# Patient Record
Sex: Female | Born: 1945
Health system: Southern US, Community
[De-identification: ages and names within clinical notes are randomized; demographics above are authoritative.]

## PROBLEM LIST (undated history)

## (undated) DIAGNOSIS — I471 Supraventricular tachycardia, unspecified: Secondary | ICD-10-CM

## (undated) DIAGNOSIS — R002 Palpitations: Secondary | ICD-10-CM

## (undated) DIAGNOSIS — M5416 Radiculopathy, lumbar region: Secondary | ICD-10-CM

## (undated) DIAGNOSIS — R7303 Prediabetes: Secondary | ICD-10-CM

## (undated) DIAGNOSIS — H209 Unspecified iridocyclitis: Secondary | ICD-10-CM

## (undated) DIAGNOSIS — M1990 Unspecified osteoarthritis, unspecified site: Secondary | ICD-10-CM

## (undated) DIAGNOSIS — M810 Age-related osteoporosis without current pathological fracture: Secondary | ICD-10-CM

## (undated) DIAGNOSIS — N952 Postmenopausal atrophic vaginitis: Secondary | ICD-10-CM

## (undated) DIAGNOSIS — N816 Rectocele: Secondary | ICD-10-CM

## (undated) DIAGNOSIS — E785 Hyperlipidemia, unspecified: Secondary | ICD-10-CM

## (undated) DIAGNOSIS — R0789 Other chest pain: Secondary | ICD-10-CM

## (undated) DIAGNOSIS — Z9841 Cataract extraction status, right eye: Secondary | ICD-10-CM

## (undated) DIAGNOSIS — R011 Cardiac murmur, unspecified: Secondary | ICD-10-CM

## (undated) DIAGNOSIS — R030 Elevated blood-pressure reading, without diagnosis of hypertension: Secondary | ICD-10-CM

## (undated) DIAGNOSIS — IMO0002 Reserved for concepts with insufficient information to code with codable children: Secondary | ICD-10-CM

## (undated) DIAGNOSIS — H269 Unspecified cataract: Secondary | ICD-10-CM

## (undated) DIAGNOSIS — Z9842 Cataract extraction status, left eye: Secondary | ICD-10-CM

## (undated) DIAGNOSIS — K219 Gastro-esophageal reflux disease without esophagitis: Secondary | ICD-10-CM

## (undated) DIAGNOSIS — H409 Unspecified glaucoma: Secondary | ICD-10-CM

## (undated) DIAGNOSIS — I341 Nonrheumatic mitral (valve) prolapse: Secondary | ICD-10-CM

## (undated) DIAGNOSIS — M7138 Other bursal cyst, other site: Secondary | ICD-10-CM

## (undated) HISTORY — DX: Other chest pain: R07.89

## (undated) HISTORY — DX: Nonrheumatic mitral (valve) prolapse: I34.1

## (undated) HISTORY — PX: ABDOMINAL HYSTERECTOMY: SHX81

## (undated) HISTORY — DX: Age-related osteoporosis without current pathological fracture: M81.0

## (undated) HISTORY — DX: Elevated blood-pressure reading, without diagnosis of hypertension: R03.0

## (undated) HISTORY — DX: Hyperlipidemia, unspecified: E78.5

## (undated) HISTORY — DX: Rectocele: N81.6

## (undated) HISTORY — DX: Postmenopausal atrophic vaginitis: N95.2

## (undated) HISTORY — DX: Supraventricular tachycardia, unspecified: I47.10

## (undated) HISTORY — PX: CATARACT EXTRACTION, BILATERAL: SHX1313

## (undated) HISTORY — DX: Unspecified cataract: H26.9

## (undated) HISTORY — PX: EYE SURGERY: SHX253

## (undated) HISTORY — DX: Gastro-esophageal reflux disease without esophagitis: K21.9

## (undated) HISTORY — DX: Unspecified glaucoma: H40.9

## (undated) HISTORY — PX: TUBAL LIGATION: SHX77

## (undated) HISTORY — DX: Unspecified osteoarthritis, unspecified site: M19.90

## (undated) HISTORY — DX: Reserved for concepts with insufficient information to code with codable children: IMO0002

## (undated) HISTORY — DX: Unspecified iridocyclitis: H20.9

---

## 1979-01-26 DIAGNOSIS — L821 Other seborrheic keratosis: Secondary | ICD-10-CM | POA: Insufficient documentation

## 2010-01-05 LAB — HM PAP SMEAR: HM Pap smear: NORMAL

## 2011-12-20 DIAGNOSIS — H4050X0 Glaucoma secondary to other eye disorders, unspecified eye, stage unspecified: Secondary | ICD-10-CM | POA: Diagnosis not present

## 2011-12-20 DIAGNOSIS — H209 Unspecified iridocyclitis: Secondary | ICD-10-CM | POA: Diagnosis not present

## 2011-12-20 DIAGNOSIS — H409 Unspecified glaucoma: Secondary | ICD-10-CM | POA: Diagnosis not present

## 2012-02-01 DIAGNOSIS — Z1231 Encounter for screening mammogram for malignant neoplasm of breast: Secondary | ICD-10-CM | POA: Diagnosis not present

## 2012-03-08 DIAGNOSIS — M810 Age-related osteoporosis without current pathological fracture: Secondary | ICD-10-CM | POA: Diagnosis not present

## 2012-03-08 DIAGNOSIS — Z79899 Other long term (current) drug therapy: Secondary | ICD-10-CM | POA: Diagnosis not present

## 2012-03-08 DIAGNOSIS — Z0000 Encounter for general adult medical examination without abnormal findings: Secondary | ICD-10-CM | POA: Diagnosis not present

## 2012-03-15 DIAGNOSIS — Z0000 Encounter for general adult medical examination without abnormal findings: Secondary | ICD-10-CM | POA: Diagnosis not present

## 2012-03-15 DIAGNOSIS — M899 Disorder of bone, unspecified: Secondary | ICD-10-CM | POA: Diagnosis not present

## 2012-03-15 DIAGNOSIS — M949 Disorder of cartilage, unspecified: Secondary | ICD-10-CM | POA: Diagnosis not present

## 2012-03-15 DIAGNOSIS — M810 Age-related osteoporosis without current pathological fracture: Secondary | ICD-10-CM | POA: Diagnosis not present

## 2012-03-15 DIAGNOSIS — Z01419 Encounter for gynecological examination (general) (routine) without abnormal findings: Secondary | ICD-10-CM | POA: Diagnosis not present

## 2012-03-15 DIAGNOSIS — R634 Abnormal weight loss: Secondary | ICD-10-CM | POA: Diagnosis not present

## 2012-05-11 DIAGNOSIS — R05 Cough: Secondary | ICD-10-CM | POA: Diagnosis not present

## 2012-05-28 DIAGNOSIS — Z23 Encounter for immunization: Secondary | ICD-10-CM | POA: Diagnosis not present

## 2012-08-10 DIAGNOSIS — R05 Cough: Secondary | ICD-10-CM | POA: Diagnosis not present

## 2012-12-25 DIAGNOSIS — H409 Unspecified glaucoma: Secondary | ICD-10-CM | POA: Diagnosis not present

## 2013-02-27 DIAGNOSIS — Z1231 Encounter for screening mammogram for malignant neoplasm of breast: Secondary | ICD-10-CM | POA: Diagnosis not present

## 2013-02-27 LAB — HM MAMMOGRAPHY: HM Mammogram: NORMAL

## 2013-05-06 ENCOUNTER — Ambulatory Visit (INDEPENDENT_AMBULATORY_CARE_PROVIDER_SITE_OTHER): Payer: Medicare Other | Admitting: Family Medicine

## 2013-05-06 ENCOUNTER — Encounter: Payer: Self-pay | Admitting: Family Medicine

## 2013-05-06 VITALS — BP 110/80 | HR 70 | Temp 97.9°F | Ht 65.0 in | Wt 116.0 lb

## 2013-05-06 DIAGNOSIS — Z0000 Encounter for general adult medical examination without abnormal findings: Secondary | ICD-10-CM | POA: Insufficient documentation

## 2013-05-06 DIAGNOSIS — Z23 Encounter for immunization: Secondary | ICD-10-CM | POA: Diagnosis not present

## 2013-05-06 DIAGNOSIS — M899 Disorder of bone, unspecified: Secondary | ICD-10-CM | POA: Diagnosis not present

## 2013-05-06 DIAGNOSIS — M8580 Other specified disorders of bone density and structure, unspecified site: Secondary | ICD-10-CM

## 2013-05-06 DIAGNOSIS — Z780 Asymptomatic menopausal state: Secondary | ICD-10-CM | POA: Diagnosis not present

## 2013-05-06 DIAGNOSIS — H2011 Chronic iridocyclitis, right eye: Secondary | ICD-10-CM

## 2013-05-06 DIAGNOSIS — M1990 Unspecified osteoarthritis, unspecified site: Secondary | ICD-10-CM | POA: Insufficient documentation

## 2013-05-06 LAB — CBC WITH DIFFERENTIAL/PLATELET
Basophils Relative: 0.7 % (ref 0.0–3.0)
Eosinophils Absolute: 0.2 10*3/uL (ref 0.0–0.7)
Lymphocytes Relative: 30.1 % (ref 12.0–46.0)
MCHC: 34.1 g/dL (ref 30.0–36.0)
MCV: 94.5 fl (ref 78.0–100.0)
Monocytes Absolute: 0.3 10*3/uL (ref 0.1–1.0)
Neutrophils Relative %: 60.6 % (ref 43.0–77.0)
Platelets: 197 10*3/uL (ref 150.0–400.0)
RBC: 4.54 Mil/uL (ref 3.87–5.11)
WBC: 6.1 10*3/uL (ref 4.5–10.5)

## 2013-05-06 LAB — COMPREHENSIVE METABOLIC PANEL
ALT: 25 U/L (ref 0–35)
AST: 26 U/L (ref 0–37)
Alkaline Phosphatase: 40 U/L (ref 39–117)
CO2: 29 mEq/L (ref 19–32)
Creatinine, Ser: 0.6 mg/dL (ref 0.4–1.2)
Sodium: 137 mEq/L (ref 135–145)
Total Bilirubin: 0.7 mg/dL (ref 0.3–1.2)
Total Protein: 6.8 g/dL (ref 6.0–8.3)

## 2013-05-06 LAB — TSH: TSH: 0.99 u[IU]/mL (ref 0.35–5.50)

## 2013-05-06 MED ORDER — RANITIDINE HCL 300 MG PO TABS: 300.0000 mg | ORAL_TABLET | Freq: Every day | ORAL | Status: DC

## 2013-05-06 NOTE — Progress Notes (Signed)
Subjective:    Patient ID: Christy [name], female    DOB: Jul 30, 1946, 67 y.o.   MRN: [id number]  HPI  Very pleasant 67 yo healthy female here to establish care and for medicare wellness visit.  I have personally reviewed the Medicare Annual Wellness questionnaire and have noted 1. The patient's medical and social history 2. Their use of alcohol, tobacco or illicit drugs 3. Their current medications and supplements 4. The patient's functional ability including ADL's, fall risks, home safety risks and hearing or visual             impairment. 5. Diet and physical activities 6. Evidence for depression or mood disorders  End of life wishes discussed and updated in Social History.  Recently moved to [facility] from [place].  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 Cares for her husband who has has dementia.  She feels she is coping very well with this- walks with him every day.  He goes to adult daycare 3 days per week for 6 hours.  She also is a member of a caregiver's support group.   Brings in old records- reviewed. UTD on most prevention.  Due for flu shot which she agrees to have today. Also due for bone density.  GERD- symptoms have been well controlled on current dose of Zantac.   Has appt scheduled with local opthalmologist for her chornic uveitis and glaucoma. Patient Active Problem List   Diagnosis Date Noted  . Encounter for Medicare annual wellness exam 05/06/2013  . Chronic uveitis of right eye 05/06/2013  . Arthritis    Past Medical History  Diagnosis Date  . GERD (gastroesophageal reflux disease)   . Arthritis   . Glaucoma    Past Surgical History  Procedure Laterality Date  . Cataract extraction, bilateral     History  Substance Use Topics  . Smoking status: Never Smoker   . Smokeless tobacco: Not on file  . Alcohol Use: Not on file   Family History  Problem Relation Age of Onset  . Aortic stenosis Mother   . Heart disease Mother   . Osteoporosis Mother   .  Cancer Father     prostate  . Heart disease Father   . Multiple sclerosis Daughter    Allergies  Allergen Reactions  . Xalatan [Latanoprost]     Caused eye to be red, swollen,itchy   No current outpatient prescriptions on file prior to visit.   No current facility-administered medications on file prior to visit.   The PMH, PSH, Social History, Family History, Medications, and allergies have been reviewed in [facility], and have been updated if relevant.   Review of Systems    See HPI Patient reports no  vision/ hearing changes,anorexia, weight change, fever ,adenopathy, persistant / recurrent hoarseness, swallowing issues, chest pain, edema,persistant / recurrent cough, hemoptysis, dyspnea(rest, exertional, paroxysmal nocturnal), gastrointestinal  bleeding (melena, rectal bleeding), abdominal pain, excessive heart burn, GU symptoms(dysuria, hematuria, pyuria, voiding/incontinence  Issues) syncope, focal weakness, severe memory loss, concerning skin lesions, depression, anxiety, abnormal bruising/bleeding, major joint swelling, breast masses or abnormal vaginal bleeding.    Objective:   Physical Exam BP 110/80  Pulse 70  Temp(Src) 97.9 F (36.6 C)  Ht 5\' 5"  (1.651 m)  Wt 116 lb (52.617 kg)  BMI 19.3 kg/m2  General:  Well-developed,well-nourished,in no acute distress; alert,appropriate and cooperative throughout examination Head:  normocephalic and atraumatic.   Eyes:  vision grossly intact, pupils equal, pupils round, and pupils reactive to light.   Ears:  R ear normal and L ear normal.   Nose:  no external deformity.   Mouth:  good dentition.   Neck:  No deformities, masses, or tenderness noted. Lungs:  Normal respiratory effort, chest expands symmetrically. Lungs are clear to auscultation, no crackles or wheezes. Heart:  Normal rate and regular rhythm. S1 and S2 normal without gallop, murmur, click, rub or other extra sounds. Abdomen:  Bowel sounds positive,abdomen soft and  non-tender without masses, organomegaly or hernias noted. Msk:  No deformity or scoliosis noted of thoracic or lumbar spine.   Extremities:  No clubbing, cyanosis, edema, or deformity noted with normal full range of motion of all joints.   Neurologic:  alert & oriented X3 and gait normal.   Skin:  Intact without suspicious lesions or rashes Cervical Nodes:  No lymphadenopathy noted Axillary Nodes:  No palpable lymphadenopathy Psych:  Cognition and judgment appear intact. Alert and cooperative with normal attention span and concentration. No apparent delusions, illusions, hallucinations        Assessment & Plan:  1. Post-menopausal  - DG Bone Density; Future  2. Routine general medical examination at a health care facility The patients weight, height, BMI and visual acuity have been recorded in the chart I have made referrals, counseling and provided education to the patient based review of the above and I have provided the pt with a written personalized care plan for preventive services.  - CBC with Differential - TSH - Comprehensive metabolic panel  3. Osteopenia Continue Caltrate, weight bearing exercise and adequate dietary calcium and vit d intake. - Vitamin D, 25-hydroxy  4. Chronic uveitis of right eye Followed by optho.  6. GERD Rx for Xantac refilled.

## 2013-05-06 NOTE — Addendum Note (Signed)
Addended by: Eliezer Bottom on: 05/06/2013 12:01 PM   Modules accepted: Orders

## 2013-05-06 NOTE — Patient Instructions (Addendum)
Good to see you.  Calcium supplements have received some bad press lately, with questions that they may increase risk of heart attack or blood clots.  The risk is very low, however none of these risks occur with calcium in FOOD. Try to get most or all of your calcium from your food--1200 mg/day for women over 50 and men over 70.  To figure out dietary calcium: 300 mg/day from all non dairy foods plus 300 mg per cup of milk, other dairy, or fortified juice.  Non dairy foods that contain calcium:  Kale, oranges, sardines, oatmeal, soy milk/soybeans, salmon, white beans, dried figs, turnip greens, almonds, broccoli, tofu.  We will call you with your lab results and you can view them online.

## 2013-05-07 ENCOUNTER — Encounter: Payer: Self-pay | Admitting: *Deleted

## 2013-05-07 LAB — VITAMIN D 25 HYDROXY (VIT D DEFICIENCY, FRACTURES): Vit D, 25-Hydroxy: 46 ng/mL (ref 30–89)

## 2013-06-04 ENCOUNTER — Encounter: Payer: Self-pay | Admitting: Family Medicine

## 2013-06-04 ENCOUNTER — Ambulatory Visit: Payer: Self-pay | Admitting: Family Medicine

## 2013-06-04 DIAGNOSIS — Z780 Asymptomatic menopausal state: Secondary | ICD-10-CM | POA: Diagnosis not present

## 2013-06-04 DIAGNOSIS — M810 Age-related osteoporosis without current pathological fracture: Secondary | ICD-10-CM | POA: Diagnosis not present

## 2013-06-24 DIAGNOSIS — H4010X0 Unspecified open-angle glaucoma, stage unspecified: Secondary | ICD-10-CM | POA: Diagnosis not present

## 2013-09-02 ENCOUNTER — Encounter: Payer: Self-pay | Admitting: Family Medicine

## 2013-09-02 ENCOUNTER — Ambulatory Visit (INDEPENDENT_AMBULATORY_CARE_PROVIDER_SITE_OTHER): Payer: Medicare Other | Admitting: Family Medicine

## 2013-09-02 VITALS — BP 112/70 | HR 72 | Temp 97.3°F | Ht 64.5 in | Wt 119.8 lb

## 2013-09-02 DIAGNOSIS — M810 Age-related osteoporosis without current pathological fracture: Secondary | ICD-10-CM

## 2013-09-02 MED ORDER — ALENDRONATE SODIUM 70 MG PO TABS: 70.0000 mg | ORAL_TABLET | ORAL | Status: DC

## 2013-09-02 NOTE — Progress Notes (Signed)
Pre-visit discussion using our clinic review tool. No additional management support is needed unless otherwise documented below in the visit note.  

## 2013-09-02 NOTE — Progress Notes (Signed)
  Subjective:    Patient ID: Christy [name], female    DOB: August 09, 1946, 68 y.o.   MRN: [id number]  HPI  Very pleasant 9 yofemale here to discuss osteoporosis.  Bone density from 05/2013, showed radius T score of -3.1.  She does take Calcium/Vit D.  Has never taken a bisphosphonate. Does have significant GERD/Reflux.   Patient Active Problem List   Diagnosis Date Noted  . Encounter for Medicare annual wellness exam 05/06/2013  . Chronic uveitis of right eye 05/06/2013  . Arthritis    Past Medical History  Diagnosis Date  . GERD (gastroesophageal reflux disease)   . Arthritis   . Glaucoma    Past Surgical History  Procedure Laterality Date  . Cataract extraction, bilateral     History  Substance Use Topics  . Smoking status: Never Smoker   . Smokeless tobacco: Not on file  . Alcohol Use: Not on file   Family History  Problem Relation Age of Onset  . Aortic stenosis Mother   . Heart disease Mother   . Osteoporosis Mother   . Cancer Father     prostate  . Heart disease Father   . Multiple sclerosis Daughter    Allergies  Allergen Reactions  . Xalatan [Latanoprost]     Caused eye to be red, swollen,itchy   Current Outpatient Prescriptions on File Prior to Visit  Medication Sig Dispense Refill  . aspirin 81 MG tablet Take 81 mg by mouth daily.      . brimonidine (ALPHAGAN) 0.2 % ophthalmic solution Place 1 drop into the left eye 2 (two) times daily.       . cholecalciferol (VITAMIN D) 1000 UNITS tablet Take 1,000 Units by mouth daily.      . dorzolamide-timolol (COSOPT) 22.3-6.8 MG/ML ophthalmic solution Place 1 drop into the left eye every 12 (twelve) hours.      Marland Kitchen GLUCOSAMINE CHONDROITIN COMPLX PO Take by mouth.      . Multiple Vitamin (MULTIVITAMIN) tablet Take 1 tablet by mouth daily.      . Omega-3 Fatty Acids (FISH OIL) 1200 MG CAPS Take 2 by mouth daily      . ranitidine (ZANTAC) 300 MG tablet Take 1 tablet (300 mg total) by mouth at bedtime.  90 tablet  3    No current facility-administered medications on file prior to visit.   The PMH, PSH, Social History, Family History, Medications, and allergies have been reviewed in [facility], and have been updated if relevant.   Review of Systems    See HPI   Objective:   Physical Exam BP 112/70  Pulse 72  Temp(Src) 97.3 F (36.3 C) (Oral)  Ht 5' 4.5" (1.638 m)  Wt 119 lb 12 oz (54.318 kg)  BMI 20.24 kg/m2  SpO2 97%  General:  Well-developed,well-nourished,in no acute distress; alert,appropriate and cooperative throughout examination Head:  normocephalic and atraumatic.   Psych:  Cognition and judgment appear intact. Alert and cooperative with normal attention span and concentration. No apparent delusions, illusions, hallucinations     Assessment & Plan:

## 2013-09-02 NOTE — Patient Instructions (Signed)
Please ask your insurance company about Prolia coverage.  Start Fosamax as prescribed.  Call me as soon as you have heard from your insurance company.   To figure out dietary calcium: 300 mg/day from all non dairy foods plus 300 mg per cup of milk, other dairy, or fortified juice.  Non dairy foods that contain calcium:  Kale, oranges, sardines, oatmeal, soy milk/soybeans, salmon, white beans, dried figs, turnip greens, almonds, broccoli, tofu.

## 2013-09-02 NOTE — Assessment & Plan Note (Signed)
New.  Discussed tx options. Start fosamax but may need to switch to prolia given h/o GERD. Discussed sources of dietary calcium as well. Per pt, insurance plan will pay for IV reclast.

## 2013-09-23 ENCOUNTER — Telehealth: Payer: Self-pay | Admitting: Family Medicine

## 2013-09-23 NOTE — Telephone Encounter (Signed)
Faxed insurance verification form to [employer]

## 2013-09-23 NOTE — Telephone Encounter (Signed)
Pt states she needs Prolia prior auth.  I have completed the form to be faxed, but it needs Dr. Hulen Shouts signature.  I have placed this form in an orange folder and put it on your desk to pass on to Dr. Deborra Medina. Once it's complete I will fax to Prolia. Thank you.

## 2013-09-25 NOTE — Telephone Encounter (Signed)
Attempted to contact pt, unable to leave message as no vm is set up.

## 2013-09-25 NOTE — Telephone Encounter (Signed)
Prolia responded w/approval. Earl Lagos is going to contact patient to schedule appointment.

## 2013-09-26 NOTE — Telephone Encounter (Signed)
Spoke to pt and informed her of prior authorization approval in conjunction with $147 deductible. Pt states that she is going to contact her insurance company to verify and ensure that the amount paid will cover the entire year, and both injections

## 2013-09-26 NOTE — Telephone Encounter (Signed)
Spoke to pt who confirms that she has spoken with her insurance company and they will cover the expense for pt receiving Prolia; appt scheduled for injection.

## 2013-09-26 NOTE — Telephone Encounter (Signed)
Pt states that she will contact the office back for scheduling after she has spoken with her insurance company

## 2013-10-16 ENCOUNTER — Ambulatory Visit: Payer: Medicare Other

## 2013-11-12 ENCOUNTER — Ambulatory Visit (INDEPENDENT_AMBULATORY_CARE_PROVIDER_SITE_OTHER): Payer: Medicare Other | Admitting: Family Medicine

## 2013-11-12 DIAGNOSIS — M810 Age-related osteoporosis without current pathological fracture: Secondary | ICD-10-CM

## 2013-11-12 MED ORDER — DENOSUMAB 60 MG/ML ~~LOC~~ SOLN
60.0000 mg | Freq: Once | SUBCUTANEOUS | Status: AC
  Administered 2013-11-12: 60 mg via SUBCUTANEOUS

## 2013-12-27 DIAGNOSIS — H4010X0 Unspecified open-angle glaucoma, stage unspecified: Secondary | ICD-10-CM | POA: Diagnosis not present

## 2014-03-05 ENCOUNTER — Other Ambulatory Visit: Payer: Self-pay | Admitting: Family Medicine

## 2014-03-05 DIAGNOSIS — Z1231 Encounter for screening mammogram for malignant neoplasm of breast: Secondary | ICD-10-CM

## 2014-04-01 ENCOUNTER — Telehealth: Payer: Self-pay | Admitting: Family Medicine

## 2014-04-01 NOTE — Telephone Encounter (Signed)
She wanted to schedule for sometime in sept

## 2014-04-01 NOTE — Telephone Encounter (Signed)
Pt received last prolia 10/2013. If she is wanting to have another injection, we will have to submit another request for approval. pls advise

## 2014-04-01 NOTE — Telephone Encounter (Signed)
Pt called to schedule her prolia shot.   Is it ok to schedule?

## 2014-04-04 NOTE — Telephone Encounter (Signed)
Yes ok for her to receive.

## 2014-04-04 NOTE — Telephone Encounter (Signed)
Spoke to pt and informed her paperwork has been submitted to prolia. Pt advised she will be contacted upon its return

## 2014-04-10 NOTE — Telephone Encounter (Signed)
Spoke to pt and advised per approval received. Based on paperwork, pts estimated cost is $0. Pt advised this is only an estimate and is subject to change. Inj scheduled for 05/20/14 and inj order placed.

## 2014-04-11 ENCOUNTER — Ambulatory Visit: Payer: Self-pay | Admitting: Family Medicine

## 2014-04-11 DIAGNOSIS — Z1231 Encounter for screening mammogram for malignant neoplasm of breast: Secondary | ICD-10-CM | POA: Diagnosis not present

## 2014-04-15 DIAGNOSIS — H4010X0 Unspecified open-angle glaucoma, stage unspecified: Secondary | ICD-10-CM | POA: Diagnosis not present

## 2014-04-17 ENCOUNTER — Encounter: Payer: Self-pay | Admitting: Family Medicine

## 2014-05-02 DIAGNOSIS — H4010X0 Unspecified open-angle glaucoma, stage unspecified: Secondary | ICD-10-CM | POA: Diagnosis not present

## 2014-05-20 ENCOUNTER — Encounter: Payer: Self-pay | Admitting: Family Medicine

## 2014-05-20 ENCOUNTER — Ambulatory Visit (INDEPENDENT_AMBULATORY_CARE_PROVIDER_SITE_OTHER): Payer: Medicare Other

## 2014-05-20 ENCOUNTER — Other Ambulatory Visit: Payer: Self-pay | Admitting: Family Medicine

## 2014-05-20 DIAGNOSIS — Z23 Encounter for immunization: Secondary | ICD-10-CM | POA: Diagnosis not present

## 2014-05-20 DIAGNOSIS — Z0000 Encounter for general adult medical examination without abnormal findings: Secondary | ICD-10-CM

## 2014-05-22 ENCOUNTER — Other Ambulatory Visit: Payer: Medicare Other

## 2014-05-22 ENCOUNTER — Other Ambulatory Visit (INDEPENDENT_AMBULATORY_CARE_PROVIDER_SITE_OTHER): Payer: Medicare Other

## 2014-05-22 DIAGNOSIS — Z0000 Encounter for general adult medical examination without abnormal findings: Secondary | ICD-10-CM

## 2014-05-22 DIAGNOSIS — Z79899 Other long term (current) drug therapy: Secondary | ICD-10-CM | POA: Diagnosis not present

## 2014-05-22 LAB — LIPID PANEL
CHOL/HDL RATIO: 3
Cholesterol: 195 mg/dL (ref 0–200)
HDL: 65.2 mg/dL (ref >39.00)
LDL Cholesterol: 120 mg/dL — ABNORMAL HIGH (ref 0–99)
NONHDL: 129.8
Triglycerides: 49 mg/dL (ref 0.0–149.0)
VLDL: 9.8 mg/dL (ref 0.0–40.0)

## 2014-05-22 LAB — COMPREHENSIVE METABOLIC PANEL
ALBUMIN: 4.2 g/dL (ref 3.5–5.2)
ALT: 25 U/L (ref 0–35)
AST: 26 U/L (ref 0–37)
Alkaline Phosphatase: 30 U/L — ABNORMAL LOW (ref 39–117)
BUN: 15 mg/dL (ref 6–23)
CO2: 30 mEq/L (ref 19–32)
CREATININE: 0.7 mg/dL (ref 0.4–1.2)
Calcium: 8.6 mg/dL (ref 8.4–10.5)
Chloride: 102 mEq/L (ref 96–112)
GFR: 94.24 mL/min (ref >60.00)
Glucose, Bld: 94 mg/dL (ref 70–99)
Potassium: 4.1 mEq/L (ref 3.5–5.1)
SODIUM: 136 meq/L (ref 135–145)
Total Bilirubin: 0.6 mg/dL (ref 0.2–1.2)
Total Protein: 6.7 g/dL (ref 6.0–8.3)

## 2014-05-22 LAB — CBC WITH DIFFERENTIAL/PLATELET
BASOS PCT: 0.8 % (ref 0.0–3.0)
Basophils Absolute: 0 10*3/uL (ref 0.0–0.1)
EOS ABS: 0.1 10*3/uL (ref 0.0–0.7)
Eosinophils Relative: 2.9 % (ref 0.0–5.0)
HCT: 42.7 % (ref 36.0–46.0)
HEMOGLOBIN: 14.2 g/dL (ref 12.0–15.0)
Lymphocytes Relative: 37.2 % (ref 12.0–46.0)
Lymphs Abs: 1.8 10*3/uL (ref 0.7–4.0)
MCHC: 33.3 g/dL (ref 30.0–36.0)
MCV: 97.2 fl (ref 78.0–100.0)
MONO ABS: 0.4 10*3/uL (ref 0.1–1.0)
Monocytes Relative: 8.3 % (ref 3.0–12.0)
NEUTROS ABS: 2.5 10*3/uL (ref 1.4–7.7)
Neutrophils Relative %: 50.8 % (ref 43.0–77.0)
Platelets: 177 10*3/uL (ref 150.0–400.0)
RBC: 4.39 Mil/uL (ref 3.87–5.11)
RDW: 13.7 % (ref 11.5–15.5)
WBC: 4.9 10*3/uL (ref 4.0–10.5)

## 2014-05-22 LAB — TSH: TSH: 0.52 u[IU]/mL (ref 0.35–4.50)

## 2014-06-03 ENCOUNTER — Ambulatory Visit (INDEPENDENT_AMBULATORY_CARE_PROVIDER_SITE_OTHER): Payer: Medicare Other | Admitting: Family Medicine

## 2014-06-03 ENCOUNTER — Encounter: Payer: Self-pay | Admitting: Family Medicine

## 2014-06-03 ENCOUNTER — Encounter: Payer: Medicare Other | Admitting: Family Medicine

## 2014-06-03 VITALS — BP 128/82 | HR 80 | Temp 98.0°F | Ht 64.75 in | Wt 121.0 lb

## 2014-06-03 DIAGNOSIS — Z0000 Encounter for general adult medical examination without abnormal findings: Secondary | ICD-10-CM | POA: Diagnosis not present

## 2014-06-03 DIAGNOSIS — M810 Age-related osteoporosis without current pathological fracture: Secondary | ICD-10-CM

## 2014-06-03 DIAGNOSIS — N819 Female genital prolapse, unspecified: Secondary | ICD-10-CM | POA: Diagnosis not present

## 2014-06-03 DIAGNOSIS — H2011 Chronic iridocyclitis, right eye: Secondary | ICD-10-CM

## 2014-06-03 MED ORDER — DENOSUMAB 60 MG/ML ~~LOC~~ SOLN
60.0000 mg | Freq: Once | SUBCUTANEOUS | Status: AC
  Administered 2014-06-03: 60 mg via SUBCUTANEOUS

## 2014-06-03 MED ORDER — RANITIDINE HCL 300 MG PO TABS: 300.0000 mg | ORAL_TABLET | Freq: Every day | ORAL | Status: DC

## 2014-06-03 NOTE — Assessment & Plan Note (Signed)
The patients weight, height, BMI and visual acuity have been recorded in the chart I have made referrals, counseling and provided education to the patient based review of the above and I have provided the pt with a written personalized care plan for preventive services.  

## 2014-06-03 NOTE — Patient Instructions (Signed)
It was lovely to see you. Please return for your prevnar 13 pneumonia booster. Let me know if you do decide that you want to speak with one of our therapist and or see a gynecologist for pelvic organ prolapse.  Enjoy your family this weekend!

## 2014-06-03 NOTE — Assessment & Plan Note (Signed)
New- she is currently asymptomatic. We did discuss referral to GYN to discuss options- pessary vs surgical options. She will let me know if her symptoms progress.

## 2014-06-03 NOTE — Progress Notes (Signed)
Pre visit review using our clinic review tool, if applicable. No additional management support is needed unless otherwise documented below in the visit note. 

## 2014-06-03 NOTE — Assessment & Plan Note (Signed)
Prolia IM today. DEXA due next year.

## 2014-06-03 NOTE — Progress Notes (Signed)
Subjective:    Patient ID: Christy [name], female    DOB: October 10, 1945, 68 y.o.   MRN: [id number]  HPI  Very pleasant 68 yo healthy female here  for medicare wellness visit.  I have personally reviewed the Medicare Annual Wellness questionnaire and have noted 1. The patient's medical and social history 2. Their use of alcohol, tobacco or illicit drugs 3. Their current medications and supplements 4. The patient's functional ability including ADL's, fall risks, home safety risks and hearing or visual             impairment. 5. Diet and physical activities 6. Evidence for depression or mood disorders  End of life wishes discussed and updated in Social History.  The roster of all physicians providing medical care to patient - is listed in the Snapshot section of the chart.  Pneumovax 01/05/10 Td 12/21/06 Mammogram 04/11/14 Colonoscopy 01/14/09- [facility]- 10 year recall. Zoster 02/24/06 Influenza quad 05/20/14 Eye exam 05/02/14 - [facility]  Osteoporosis- Bone density from 05/2013, showed radius T score of -3.1.  She does take Calcium/Vit D. Could not tolerate fosamax due to reflux.  Received first prolia injection on 11/12/13.  Due for second injection.   Cares for her husband who has has dementia.  She feels she is coping ok with this- walks with him every day.  He goes to adult daycare 3 days per week for 6 hours.  She also is a member of a caregiver's support group.  She does get tired- "full time job."  Children visit often- they are coming this weekend.     GERD- symptoms have been well controlled on current dose of Zantac.   Pressure in her pelvic area- has noticed intermittent "pressure."  NO dysuria or incontinence. No post menopausal bleeding. No h/o abnormal pap smears.  Lab Results  Component Value Date   CHOL 195 05/22/2014   HDL 65.20 05/22/2014   LDLCALC 120* 05/22/2014   TRIG 49.0 05/22/2014   CHOLHDL 3 05/22/2014   Lab Results  Component Value Date   CREATININE 0.7  05/22/2014   Lab Results  Component Value Date   TSH 0.52 05/22/2014   Lab Results  Component Value Date   WBC 4.9 05/22/2014   HGB 14.2 05/22/2014   HCT 42.7 05/22/2014   MCV 97.2 05/22/2014   PLT 177.0 05/22/2014      Patient Active Problem List   Diagnosis Date Noted  . Osteoporosis 09/02/2013  . Encounter for Medicare annual wellness exam 05/06/2013  . Chronic uveitis of right eye 05/06/2013  . Arthritis    Past Medical History  Diagnosis Date  . GERD (gastroesophageal reflux disease)   . Arthritis   . Glaucoma    Past Surgical History  Procedure Laterality Date  . Cataract extraction, bilateral     History  Substance Use Topics  . Smoking status: Never Smoker   . Smokeless tobacco: Not on file  . Alcohol Use: Not on file   Family History  Problem Relation Age of Onset  . Aortic stenosis Mother   . Heart disease Mother   . Osteoporosis Mother   . Cancer Father     prostate  . Heart disease Father   . Multiple sclerosis Daughter    Allergies  Allergen Reactions  . Xalatan [Latanoprost]     Caused eye to be red, swollen,itchy   Current Outpatient Prescriptions on File Prior to Visit  Medication Sig Dispense Refill  . aspirin 81 MG tablet Take 81 mg by mouth  daily.      . brimonidine (ALPHAGAN) 0.2 % ophthalmic solution Place 1 drop into the left eye 2 (two) times daily.       . cholecalciferol (VITAMIN D) 1000 UNITS tablet Take 1,000 Units by mouth daily.      . dorzolamide-timolol (COSOPT) 22.3-6.8 MG/ML ophthalmic solution Place 1 drop into the left eye every 12 (twelve) hours.      Marland Kitchen GLUCOSAMINE CHONDROITIN COMPLX PO Take by mouth.      . Multiple Vitamin (MULTIVITAMIN) tablet Take 1 tablet by mouth daily.      . Omega-3 Fatty Acids (FISH OIL) 1200 MG CAPS Take 2 by mouth daily       No current facility-administered medications on file prior to visit.   The PMH, PSH, Social History, Family History, Medications, and allergies have been reviewed in [facility],  and have been updated if relevant.   Review of Systems    See HPI Appetite fair- weight stable Wt Readings from Last 3 Encounters:  06/03/14 121 lb (54.885 kg)  09/02/13 119 lb 12 oz (54.318 kg)  05/06/13 116 lb (52.617 kg)   Sleeping "ok"- has to wake up her with her husband a few times a night No blood in stool No changes in bowel habits No panic attacks No pelvic pain No CP   Objective:   Physical Exam BP 128/82  Pulse 80  Temp(Src) 98 F (36.7 C) (Oral)  Ht 5' 4.75" (1.645 m)  Wt 121 lb (54.885 kg)  BMI 20.28 kg/m2  SpO2 98%  General:  Well-developed,well-nourished,in no acute distress; alert,appropriate and cooperative throughout examination Head:  normocephalic and atraumatic.   Eyes:  vision grossly intact, pupils equal, pupils round, and pupils reactive to light.   Ears:  R ear normal and L ear normal.   Nose:  no external deformity.   Mouth:  good dentition.   Neck:  No deformities, masses, or tenderness noted. Lungs:  Normal respiratory effort, chest expands symmetrically. Lungs are clear to auscultation, no crackles or wheezes. Heart:  Normal rate and regular rhythm. S1 and S2 normal without gallop, murmur, click, rub or other extra sounds. Abdomen:  Bowel sounds positive,abdomen soft and non-tender without masses, organomegaly or hernias noted. Msk:  No deformity or scoliosis noted of thoracic or lumbar spine.   Extremities:  No clubbing, cyanosis, edema, or deformity noted with normal full range of motion of all joints.   Neurologic:  alert & oriented X3 and gait normal.   Skin:  Intact without suspicious lesions or rashes Cervical Nodes:  No lymphadenopathy noted Axillary Nodes:  No palpable lymphadenopathy Psych:  Cognition and judgment appear intact. Alert and cooperative with normal attention span and concentration. No apparent delusions, illusions, hallucinations GU:  Normal external genitalia, when she bears down, does have some pelvic organ  prolapse- probable cystocele      Assessment & Plan:

## 2014-07-10 ENCOUNTER — Ambulatory Visit (INDEPENDENT_AMBULATORY_CARE_PROVIDER_SITE_OTHER): Payer: Medicare Other

## 2014-07-10 DIAGNOSIS — Z23 Encounter for immunization: Secondary | ICD-10-CM

## 2014-08-24 ENCOUNTER — Encounter: Payer: Self-pay | Admitting: Family Medicine

## 2014-10-09 DIAGNOSIS — N816 Rectocele: Secondary | ICD-10-CM | POA: Diagnosis not present

## 2014-10-09 DIAGNOSIS — N952 Postmenopausal atrophic vaginitis: Secondary | ICD-10-CM | POA: Diagnosis not present

## 2014-10-09 DIAGNOSIS — N812 Incomplete uterovaginal prolapse: Secondary | ICD-10-CM | POA: Diagnosis not present

## 2014-10-09 DIAGNOSIS — N8110 Cystocele, unspecified: Secondary | ICD-10-CM | POA: Diagnosis not present

## 2014-10-17 DIAGNOSIS — H4010X0 Unspecified open-angle glaucoma, stage unspecified: Secondary | ICD-10-CM | POA: Diagnosis not present

## 2014-10-23 DIAGNOSIS — N816 Rectocele: Secondary | ICD-10-CM | POA: Diagnosis not present

## 2014-10-23 DIAGNOSIS — N952 Postmenopausal atrophic vaginitis: Secondary | ICD-10-CM | POA: Diagnosis not present

## 2014-10-23 DIAGNOSIS — N812 Incomplete uterovaginal prolapse: Secondary | ICD-10-CM | POA: Diagnosis not present

## 2014-10-23 DIAGNOSIS — N8110 Cystocele, unspecified: Secondary | ICD-10-CM | POA: Diagnosis not present

## 2014-10-31 DIAGNOSIS — H4011X0 Primary open-angle glaucoma, stage unspecified: Secondary | ICD-10-CM | POA: Diagnosis not present

## 2014-11-26 ENCOUNTER — Telehealth: Payer: Self-pay

## 2014-11-26 NOTE — Telephone Encounter (Signed)
Pt left v/m; pt received last prolia injection on 06/03/2014; pt request cb with appt for prolia after approved by medicare. ? Does pt need Calcium level also.Please advise.

## 2014-11-26 NOTE — Telephone Encounter (Signed)
If you don't mind, can you submit this online for me. If approved, i will contact pt to schedule Calcium lab

## 2014-12-01 NOTE — Telephone Encounter (Signed)
I have electronically submitted pt's info for Prolia insurance verification and will notify you once I have a response. Thank you. °

## 2014-12-02 ENCOUNTER — Encounter: Payer: Self-pay | Admitting: Family Medicine

## 2014-12-08 NOTE — Telephone Encounter (Signed)
I have rec'd pt's insurance verification for Prolia.  Christy [name] will have an estimated responsibility of 0% plus $166 deductible for a total estimated responsibility of $166.  I have sent a copy of the summary of benefits to be scanned into chart.  If Christy [name] cannot afford [phone] for her injection please advise her to contact [employer]ia at [phone] to see if she qualifies for one of their assistance programs.  If she qualifies, they will instruct her how to proceed.  If you have any questions, please let me know. Thank you.

## 2014-12-08 NOTE — Telephone Encounter (Signed)
Spoke to pt and advised regarding prolia. Pt is agreeable to deductible and Calcium lab has been scheduled.

## 2014-12-17 ENCOUNTER — Other Ambulatory Visit (INDEPENDENT_AMBULATORY_CARE_PROVIDER_SITE_OTHER): Payer: Medicare Other

## 2014-12-17 DIAGNOSIS — M810 Age-related osteoporosis without current pathological fracture: Secondary | ICD-10-CM | POA: Diagnosis not present

## 2014-12-17 LAB — CALCIUM: CALCIUM: 9.2 mg/dL (ref 8.4–10.5)

## 2014-12-31 ENCOUNTER — Encounter: Payer: Self-pay | Admitting: Family Medicine

## 2015-01-09 ENCOUNTER — Ambulatory Visit (INDEPENDENT_AMBULATORY_CARE_PROVIDER_SITE_OTHER): Payer: Medicare Other | Admitting: *Deleted

## 2015-01-09 DIAGNOSIS — M810 Age-related osteoporosis without current pathological fracture: Secondary | ICD-10-CM

## 2015-01-09 MED ORDER — DENOSUMAB 60 MG/ML ~~LOC~~ SOLN
60.0000 mg | Freq: Once | SUBCUTANEOUS | Status: AC
  Administered 2015-01-09: 60 mg via SUBCUTANEOUS

## 2015-05-12 DIAGNOSIS — H4011X0 Primary open-angle glaucoma, stage unspecified: Secondary | ICD-10-CM | POA: Diagnosis not present

## 2015-05-27 ENCOUNTER — Other Ambulatory Visit: Payer: Self-pay | Admitting: Family Medicine

## 2015-05-27 DIAGNOSIS — Z0000 Encounter for general adult medical examination without abnormal findings: Secondary | ICD-10-CM

## 2015-06-03 ENCOUNTER — Other Ambulatory Visit (INDEPENDENT_AMBULATORY_CARE_PROVIDER_SITE_OTHER): Payer: Medicare Other

## 2015-06-03 DIAGNOSIS — Z79899 Other long term (current) drug therapy: Secondary | ICD-10-CM

## 2015-06-03 DIAGNOSIS — Z0000 Encounter for general adult medical examination without abnormal findings: Secondary | ICD-10-CM | POA: Diagnosis not present

## 2015-06-03 DIAGNOSIS — M810 Age-related osteoporosis without current pathological fracture: Secondary | ICD-10-CM | POA: Diagnosis not present

## 2015-06-03 LAB — COMPREHENSIVE METABOLIC PANEL
ALBUMIN: 4.1 g/dL (ref 3.5–5.2)
ALT: 21 U/L (ref 0–35)
AST: 25 U/L (ref 0–37)
Alkaline Phosphatase: 29 U/L — ABNORMAL LOW (ref 39–117)
BUN: 13 mg/dL (ref 6–23)
CALCIUM: 8.8 mg/dL (ref 8.4–10.5)
CHLORIDE: 104 meq/L (ref 96–112)
CO2: 31 mEq/L (ref 19–32)
Creatinine, Ser: 0.65 mg/dL (ref 0.40–1.20)
GFR: 95.63 mL/min (ref >60.00)
Glucose, Bld: 86 mg/dL (ref 70–99)
POTASSIUM: 4.3 meq/L (ref 3.5–5.1)
SODIUM: 139 meq/L (ref 135–145)
Total Bilirubin: 0.7 mg/dL (ref 0.2–1.2)
Total Protein: 6.5 g/dL (ref 6.0–8.3)

## 2015-06-03 LAB — CBC WITH DIFFERENTIAL/PLATELET
BASOS PCT: 0.8 % (ref 0.0–3.0)
Basophils Absolute: 0 10*3/uL (ref 0.0–0.1)
EOS PCT: 3.1 % (ref 0.0–5.0)
Eosinophils Absolute: 0.2 10*3/uL (ref 0.0–0.7)
HCT: 45.2 % (ref 36.0–46.0)
HEMOGLOBIN: 14.7 g/dL (ref 12.0–15.0)
Lymphocytes Relative: 32.7 % (ref 12.0–46.0)
Lymphs Abs: 2 10*3/uL (ref 0.7–4.0)
MCHC: 32.6 g/dL (ref 30.0–36.0)
MCV: 98 fl (ref 78.0–100.0)
MONO ABS: 0.4 10*3/uL (ref 0.1–1.0)
Monocytes Relative: 6.8 % (ref 3.0–12.0)
Neutro Abs: 3.5 10*3/uL (ref 1.4–7.7)
Neutrophils Relative %: 56.6 % (ref 43.0–77.0)
Platelets: 186 10*3/uL (ref 150.0–400.0)
RBC: 4.61 Mil/uL (ref 3.87–5.11)
RDW: 13.5 % (ref 11.5–15.5)
WBC: 6.1 10*3/uL (ref 4.0–10.5)

## 2015-06-03 LAB — LIPID PANEL
CHOLESTEROL: 203 mg/dL — AB (ref 0–200)
HDL: 72.8 mg/dL (ref >39.00)
LDL CALC: 119 mg/dL — AB (ref 0–99)
NonHDL: 130.16
TRIGLYCERIDES: 56 mg/dL (ref 0.0–149.0)
Total CHOL/HDL Ratio: 3
VLDL: 11.2 mg/dL (ref 0.0–40.0)

## 2015-06-03 LAB — TSH: TSH: 0.98 u[IU]/mL (ref 0.35–4.50)

## 2015-06-09 ENCOUNTER — Encounter: Payer: Self-pay | Admitting: Family Medicine

## 2015-06-09 ENCOUNTER — Telehealth: Payer: Self-pay | Admitting: Family Medicine

## 2015-06-09 ENCOUNTER — Ambulatory Visit (INDEPENDENT_AMBULATORY_CARE_PROVIDER_SITE_OTHER): Payer: Medicare Other | Admitting: Family Medicine

## 2015-06-09 VITALS — BP 130/74 | HR 72 | Temp 97.9°F | Ht 64.75 in | Wt 121.0 lb

## 2015-06-09 DIAGNOSIS — N819 Female genital prolapse, unspecified: Secondary | ICD-10-CM

## 2015-06-09 DIAGNOSIS — M1990 Unspecified osteoarthritis, unspecified site: Secondary | ICD-10-CM

## 2015-06-09 DIAGNOSIS — Z23 Encounter for immunization: Secondary | ICD-10-CM

## 2015-06-09 DIAGNOSIS — Z0000 Encounter for general adult medical examination without abnormal findings: Secondary | ICD-10-CM

## 2015-06-09 DIAGNOSIS — M810 Age-related osteoporosis without current pathological fracture: Secondary | ICD-10-CM | POA: Diagnosis not present

## 2015-06-09 DIAGNOSIS — K219 Gastro-esophageal reflux disease without esophagitis: Secondary | ICD-10-CM | POA: Diagnosis not present

## 2015-06-09 DIAGNOSIS — H2011 Chronic iridocyclitis, right eye: Secondary | ICD-10-CM

## 2015-06-09 NOTE — Assessment & Plan Note (Signed)
Receiving prolia. Due for dexa- order placed.

## 2015-06-09 NOTE — Telephone Encounter (Signed)
Pt stopped at front desk to make dexa appt. No order was entered. Please advise.

## 2015-06-09 NOTE — Telephone Encounter (Signed)
Order entered

## 2015-06-09 NOTE — Progress Notes (Addendum)
Subjective:    Patient ID: Christy [name], female    DOB: Mar 09, 1946, 69 y.o.   MRN: [id number]  HPI  Very pleasant 69 yo healthy female here  for medicare wellness visit and follow up of chronic medical conditions.  I have personally reviewed the Medicare Annual Wellness questionnaire and have noted 1. The patient's medical and social history 2. Their use of alcohol, tobacco or illicit drugs 3. Their current medications and supplements 4. The patient's functional ability including ADL's, fall risks, home safety risks and hearing or visual             impairment. 5. Diet and physical activities 6. Evidence for depression or mood disorders  End of life wishes discussed and updated in Social History.  The roster of all physicians providing medical care to patient - is listed in the Snapshot section of the chart.  Pneumovax 01/05/10 Prevnar 13 07/10/14 Td 12/21/06 Mammogram 04/11/14 Colonoscopy 01/14/09- [facility]- 10 year recall. Zoster 02/24/06 Eye exam 05/12/15 - [facility]  Pelvic organ prolapse- now seeing Dr. Enzo Bi. Couldn't tolerate pessary.  Topical estrogen has been helping.   Osteoporosis- Bone density from 05/2013, showed radius T score of -3.1.  She does take Calcium/Vit D. Could not tolerate fosamax due to reflux.  Has been receiving prolia injections here- last injection was on 01/09/15.  Cares for her husband who has has dementia.  She feels she is coping ok with this- he has been declining gradually.  He goes to adult daycare 3 days per week for 6 hours.  She also is a member of a caregiver's support group.    GERD- symptoms have been well controlled on current dose of Zantac.   Lab Results  Component Value Date   CHOL 203* 06/03/2015   HDL 72.80 06/03/2015   LDLCALC 119* 06/03/2015   TRIG 56.0 06/03/2015   CHOLHDL 3 06/03/2015   Lab Results  Component Value Date   CREATININE 0.65 06/03/2015   Lab Results  Component Value Date   TSH 0.98 06/03/2015    Lab Results  Component Value Date   WBC 6.1 06/03/2015   HGB 14.7 06/03/2015   HCT 45.2 06/03/2015   MCV 98.0 06/03/2015   PLT 186.0 06/03/2015      Patient Active Problem List   Diagnosis Date Noted  . GERD (gastroesophageal reflux disease) 06/09/2015  . Prolapse of female pelvic organs 06/03/2014  . Osteoporosis 09/02/2013  . Encounter for Medicare annual wellness exam 05/06/2013  . Chronic uveitis of right eye 05/06/2013  . Arthritis    Past Medical History  Diagnosis Date  . GERD (gastroesophageal reflux disease)   . Arthritis   . Glaucoma    Past Surgical History  Procedure Laterality Date  . Cataract extraction, bilateral     Social History  Substance Use Topics  . Smoking status: Never Smoker   . Smokeless tobacco: None  . Alcohol Use: None   Family History  Problem Relation Age of Onset  . Aortic stenosis Mother   . Heart disease Mother   . Osteoporosis Mother   . Cancer Father     prostate  . Heart disease Father   . Multiple sclerosis Daughter    Allergies  Allergen Reactions  . Xalatan [Latanoprost]     Caused eye to be red, swollen,itchy   Current Outpatient Prescriptions on File Prior to Visit  Medication Sig Dispense Refill  . aspirin 81 MG tablet Take 81 mg by mouth daily.    . brimonidine (  ALPHAGAN) 0.2 % ophthalmic solution Place 1 drop into the left eye 2 (two) times daily.     . cholecalciferol (VITAMIN D) 1000 UNITS tablet Take 1,000 Units by mouth daily.    . dorzolamide-timolol (COSOPT) 22.3-6.8 MG/ML ophthalmic solution Place 1 drop into the left eye every 12 (twelve) hours.    Marland Kitchen GLUCOSAMINE CHONDROITIN COMPLX PO Take by mouth.    . Multiple Vitamin (MULTIVITAMIN) tablet Take 1 tablet by mouth daily.    . Omega-3 Fatty Acids (FISH OIL) 1200 MG CAPS Take 2 by mouth daily    . ranitidine (ZANTAC) 300 MG tablet Take 1 tablet (300 mg total) by mouth at bedtime. 90 tablet 3   No current facility-administered medications on file prior  to visit.   The PMH, PSH, Social History, Family History, Medications, and allergies have been reviewed in [facility], and have been updated if relevant.   Review of Systems  Constitutional: Negative.   HENT: Negative.   Eyes: Negative.   Respiratory: Negative.   Cardiovascular: Negative.   Gastrointestinal: Negative.   Endocrine: Negative.   Genitourinary: Negative.   Musculoskeletal: Negative.   Skin: Negative.   Allergic/Immunologic: Negative.   Neurological: Negative.   Hematological: Negative.   Psychiatric/Behavioral: Negative.   All other systems reviewed and are negative.     Wt Readings from Last 3 Encounters:  06/09/15 121 lb (54.885 kg)  06/03/14 121 lb (54.885 kg)  09/02/13 119 lb 12 oz (54.318 kg)     Objective:   Physical Exam BP 130/74 mmHg  Pulse 72  Temp(Src) 97.9 F (36.6 C) (Oral)  Ht 5' 4.75" (1.645 m)  Wt 121 lb (54.885 kg)  BMI 20.28 kg/m2  SpO2 98%  General:  Well-developed,well-nourished,in no acute distress; alert,appropriate and cooperative throughout examination Head:  normocephalic and atraumatic.   Eyes:  vision grossly intact, pupils equal, pupils round, and pupils reactive to light.   Ears:  R ear normal and L ear normal.   Nose:  no external deformity.   Mouth:  good dentition.   Neck:  No deformities, masses, or tenderness noted. Lungs:  Normal respiratory effort, chest expands symmetrically. Lungs are clear to auscultation, no crackles or wheezes. Heart:  Normal rate and regular rhythm. S1 and S2 normal without gallop, murmur, click, rub or other extra sounds. Abdomen:  Bowel sounds positive,abdomen soft and non-tender without masses, organomegaly or hernias noted. Msk:  No deformity or scoliosis noted of thoracic or lumbar spine.   Extremities:  No clubbing, cyanosis, edema, or deformity noted with normal full range of motion of all joints.   Neurologic:  alert & oriented X3 and gait normal.   Skin:  Intact without suspicious lesions  or rashes Cervical Nodes:  No lymphadenopathy noted Axillary Nodes:  No palpable lymphadenopathy Psych:  Cognition and judgment appear intact. Alert and cooperative with normal attention span and concentration. No apparent delusions, illusions, hallucinations       Assessment & Plan:

## 2015-06-09 NOTE — Patient Instructions (Signed)
Great to see you. Please stop by to see Rosaria Ferries on your way out to set up your bone density scan.

## 2015-06-09 NOTE — Progress Notes (Signed)
Pre visit review using our clinic review tool, if applicable. No additional management support is needed unless otherwise documented below in the visit note. 

## 2015-06-09 NOTE — Assessment & Plan Note (Signed)
Followed by GYN. On topical estrogen twice weekly.

## 2015-06-09 NOTE — Assessment & Plan Note (Signed)
The patients weight, height, BMI and visual acuity have been recorded in the chart.  Cognitive function assessed.   I have made referrals, counseling and provided education to the patient based review of the above and I have provided the pt with a written personalized care plan for preventive services.  Influenza vaccine given today. 

## 2015-06-09 NOTE — Assessment & Plan Note (Signed)
Symptoms well controlled with daily H2 blocker. No changes made to rxs today.

## 2015-06-11 ENCOUNTER — Ambulatory Visit
Admission: RE | Admit: 2015-06-11 | Discharge: 2015-06-11 | Disposition: A | Discharge status: Home / Self Care | Payer: Medicare Other | Source: Ambulatory Visit | Attending: Family Medicine | Admitting: Family Medicine

## 2015-06-11 DIAGNOSIS — M810 Age-related osteoporosis without current pathological fracture: Secondary | ICD-10-CM | POA: Diagnosis not present

## 2015-06-11 DIAGNOSIS — Z780 Asymptomatic menopausal state: Secondary | ICD-10-CM | POA: Insufficient documentation

## 2015-06-12 ENCOUNTER — Telehealth: Payer: Self-pay | Admitting: Family Medicine

## 2015-06-12 NOTE — Telephone Encounter (Signed)
Pt returned call - please call back at [phone] Thank you

## 2015-06-15 NOTE — Telephone Encounter (Signed)
See results note. 

## 2015-06-23 NOTE — Addendum Note (Signed)
Addended by: Cloyd Stagers B on: 06/23/2015 08:49 AM   Modules accepted: Orders

## 2015-07-01 ENCOUNTER — Ambulatory Visit (INDEPENDENT_AMBULATORY_CARE_PROVIDER_SITE_OTHER): Payer: Medicare Other | Admitting: *Deleted

## 2015-07-01 DIAGNOSIS — M810 Age-related osteoporosis without current pathological fracture: Secondary | ICD-10-CM | POA: Diagnosis not present

## 2015-07-01 MED ORDER — DENOSUMAB 60 MG/ML ~~LOC~~ SOLN
60.0000 mg | Freq: Once | SUBCUTANEOUS | Status: AC
  Administered 2015-07-01: 60 mg via SUBCUTANEOUS

## 2015-07-27 ENCOUNTER — Other Ambulatory Visit: Payer: Self-pay | Admitting: Family Medicine

## 2015-08-20 ENCOUNTER — Encounter: Payer: Self-pay | Admitting: *Deleted

## 2015-10-25 ENCOUNTER — Other Ambulatory Visit: Payer: Self-pay | Admitting: Family Medicine

## 2015-11-10 DIAGNOSIS — H401122 Primary open-angle glaucoma, left eye, moderate stage: Secondary | ICD-10-CM | POA: Diagnosis not present

## 2015-11-19 DIAGNOSIS — H401122 Primary open-angle glaucoma, left eye, moderate stage: Secondary | ICD-10-CM | POA: Diagnosis not present

## 2015-12-10 ENCOUNTER — Encounter: Payer: Self-pay | Admitting: Obstetrics and Gynecology

## 2015-12-10 ENCOUNTER — Ambulatory Visit (INDEPENDENT_AMBULATORY_CARE_PROVIDER_SITE_OTHER): Payer: Medicare Other | Admitting: Obstetrics and Gynecology

## 2015-12-10 VITALS — BP 147/79 | HR 92 | Ht 65.0 in | Wt 122.9 lb

## 2015-12-10 DIAGNOSIS — N814 Uterovaginal prolapse, unspecified: Secondary | ICD-10-CM | POA: Diagnosis not present

## 2015-12-10 DIAGNOSIS — N819 Female genital prolapse, unspecified: Secondary | ICD-10-CM

## 2015-12-10 DIAGNOSIS — N8110 Cystocele, unspecified: Secondary | ICD-10-CM

## 2015-12-10 DIAGNOSIS — N816 Rectocele: Secondary | ICD-10-CM | POA: Diagnosis not present

## 2015-12-10 DIAGNOSIS — IMO0002 Reserved for concepts with insufficient information to code with codable children: Secondary | ICD-10-CM

## 2015-12-10 MED ORDER — ESTRADIOL 0.1 MG/GM VA CREA: 0.2500 | TOPICAL_CREAM | VAGINAL | Status: DC

## 2015-12-10 NOTE — Progress Notes (Signed)
GYN ENCOUNTER NOTE  Subjective:       Christy [name] is a 70 y.o. G45P2002 female is here for gynecologic evaluation of the following issues:  1. Pelvic organ prolapse.    Patient presents for reassessment of pelvic organ prolapse. Last year she was fitted with a gel horn pessary which was effective; however, she did not want to proceed with using it at that time. A ring with support and donut pessary were tried unsuccessfully. Patient is now considering use of the gel horn pessary. She does not want to proceed with surgery until her personal life improves while caring for her husband with Alzheimer's disease.  Symptoms include pressure, mild urinary urgency without leaking. Patient does have to splint for bowel movements.   Gynecologic History No LMP recorded. Patient is postmenopausal. Contraception: tubal ligation Last Pap: 2011 normal Last mammogram: 04/11/2014 BI-RADS 1  Obstetric History OB History  Gravida Para Term Preterm AB SAB TAB Ectopic Multiple Living  2 2 2       2     # Outcome Date GA Lbr Len/2nd Weight Sex Delivery Anes PTL Lv  2 Term 1978   8 lb (3.629 kg) M Vag-Spont   Y  1 Term 1975   7 lb 8 oz (3.402 kg) F Vag-Spont   Y      Past Medical History  Diagnosis Date  . GERD (gastroesophageal reflux disease)   . Arthritis   . Glaucoma   . Uveitis     right- chronic  . Glaucoma   . Atrophy of vagina   . Cystocele   . Rectocele     Past Surgical History  Procedure Laterality Date  . Cataract extraction, bilateral    . Tubal ligation      Current Outpatient Prescriptions on File Prior to Visit  Medication Sig Dispense Refill  . aspirin 81 MG tablet Take 81 mg by mouth daily.    . brimonidine (ALPHAGAN) 0.2 % ophthalmic solution Place 1 drop into the left eye 2 (two) times daily.     . cholecalciferol (VITAMIN D) 1000 UNITS tablet Take 1,000 Units by mouth daily.    . dorzolamide-timolol (COSOPT) 22.3-6.8 MG/ML ophthalmic solution Place 1 drop into the  left eye every 12 (twelve) hours.    Marland Kitchen estradiol (ESTRACE) 0.1 MG/GM vaginal cream Place 1 Applicatorful vaginally at bedtime.    Marland Kitchen GLUCOSAMINE CHONDROITIN COMPLX PO Take by mouth.    . Multiple Vitamin (MULTIVITAMIN) tablet Take 1 tablet by mouth daily.    . Omega-3 Fatty Acids (FISH OIL) 1200 MG CAPS Take 2 by mouth daily    . ranitidine (ZANTAC) 300 MG tablet TAKE ONE TABLET BY MOUTH AT BEDTIME 90 tablet 1   No current facility-administered medications on file prior to visit.    Allergies  Allergen Reactions  . Xalatan [Latanoprost]     Caused eye to be red, swollen,itchy    Social History   Social History  . Marital Status: Married    Spouse Name: N/A  . Number of Children: N/A  . Years of Education: N/A   Occupational History  . Not on file.   Social History Main Topics  . Smoking status: Never Smoker   . Smokeless tobacco: Not on file  . Alcohol Use: Yes     Comment: wine daily  . Drug Use: No  . Sexual Activity: Not Currently    Birth Control/ Protection: Surgical   Other Topics Concern  . Not on file  Social History Narrative   Recently moved to [facility] from [place].   She is primary caregiver for her husband who has Alzheimers.   Two children.      Retired Engineer, production.      Desires CPR.   Has living will- SCANNED INTO CHART   Would not want prolonged life support if futile.                      Family History  Problem Relation Age of Onset  . Aortic stenosis Mother   . Heart disease Mother   . Osteoporosis Mother   . Cancer Father     prostate  . Heart disease Father   . Multiple sclerosis Daughter     The following portions of the patient's history were reviewed and updated as appropriate: allergies, current medications, past family history, past medical history, past social history, past surgical history and problem list.  Review of Systems Review of Systems - General ROS: negative for - chills, fatigue, fever, hot  flashes, malaise or night sweats Hematological and Lymphatic ROS: negative for - bleeding problems or swollen lymph nodes Gastrointestinal ROS: negative for - abdominal pain, blood in stools, change in bowel habits and nausea/vomiting Musculoskeletal ROS: negative for - joint pain, muscle pain or muscular weakness Genito-Urinary ROS: negative for - change in menstrual cycle, dysmenorrhea, dyspareunia, dysuria, genital discharge, genital ulcers, hematuria, incontinence, irregular/heavy menses, nocturia or pelvic painjj  Objective:   There were no vitals taken for this visit. CONSTITUTIONAL: Well-developed, well-nourished female in no acute distress.  HENT:  Normocephalic, atraumatic.  NECK: Not examined SKIN: Skin is warm and dry. No rash noted. Not diaphoretic. No erythema. No pallor. [place]: Alert and oriented to person, place, and time. PSYCHIATRIC: Normal mood and affect. Normal behavior. Normal judgment and thought content. CARDIOVASCULAR:Not Examined RESPIRATORY: Not Examined BREASTS: Not Examined ABDOMEN: Soft, non distended; Non tender.  No Organomegaly. PELVIC:  External Genitalia: Normal  BUS: Normal  Vagina: Normal estrogen effect; third-degree cystourethrocele with Valsalva; moderate rectocele  Cervix: Normal; prolapses to the introitus with Valsalva  Uterus: Normal size, shape,consistency, mobile  Adnexa: Normal  RV: Normal external exam  Bladder: Nontender MUSCULOSKELETAL: Normal range of motion. No tenderness.  No cyanosis, clubbing, or edema.  PROCEDURE: Pessary fitting  70 mm gel horn pessary-provide good support but the long-stem is bothersome     Assessment:  1. Cystocele, third-degree with Valsalva 2. Moderate rectocele 3. Uterine prolapse to the introitus  Plan:   1. Pessary fitting as noted 2. 70 mm gel horn pessary (short stem) is ordered 3. Patient will return for insertion when pessary arrives  A total of 15 minutes were spent face-to-face with  the patient during this encounter and over half of that time dealt with counseling and coordination of care.  Brayton Mars, MD  Note: This dictation was prepared with Dragon dictation along with smaller phrase technology. Any transcriptional errors that result from this process are unintentional.

## 2015-12-10 NOTE — Patient Instructions (Signed)
1. 70 mm gel horn pessary (short stem) is ordered 2. Patient will be notified by phone pessary is

## 2015-12-15 ENCOUNTER — Encounter: Payer: Self-pay | Admitting: Family Medicine

## 2015-12-16 NOTE — Telephone Encounter (Signed)
Can you please process her benefits for prolia?

## 2015-12-29 ENCOUNTER — Encounter: Payer: Self-pay | Admitting: Obstetrics and Gynecology

## 2015-12-29 ENCOUNTER — Ambulatory Visit (INDEPENDENT_AMBULATORY_CARE_PROVIDER_SITE_OTHER): Payer: Medicare Other | Admitting: Obstetrics and Gynecology

## 2015-12-29 VITALS — BP 134/81 | HR 79 | Ht 65.0 in | Wt 122.1 lb

## 2015-12-29 DIAGNOSIS — N814 Uterovaginal prolapse, unspecified: Secondary | ICD-10-CM | POA: Diagnosis not present

## 2015-12-29 DIAGNOSIS — IMO0002 Reserved for concepts with insufficient information to code with codable children: Secondary | ICD-10-CM

## 2015-12-29 DIAGNOSIS — N816 Rectocele: Secondary | ICD-10-CM

## 2015-12-29 DIAGNOSIS — N8110 Cystocele, unspecified: Secondary | ICD-10-CM

## 2015-12-29 NOTE — Patient Instructions (Signed)
1. Gel horn pessary is inserted 2. Estrace cream once a week intravaginal 3. Trimosan gel once a week intravaginal 4. Return in 2 weeks for follow-up

## 2015-12-29 NOTE — Progress Notes (Signed)
Patient ID: Christy [name], female   DOB: 12/05/1945, 70 y.o.   MRN: [id number] GYN ENCOUNTER NOTE  Subjective:       Christy [name] is a 70 y.o. [phone] female is here for gynecologic evaluation of the following issues:  1. Pessary Insertion. Fitted for 70 mm gel horn pessary on 12/10/15. Patient has cystocele (3rd degree with valsalva), moderate rectocele, and uterine prolapse to introitus. Patient felt this pessary provided good support, although the long stem was bothersome.    Gynecologic History No LMP recorded. Patient is postmenopausal. Contraception: post menopausal status Last Pap: 01/05/2010. Results were: normal Last mammogram: 04/17/2014. Results were: normal  Obstetric History OB History  Gravida Para Term Preterm AB SAB TAB Ectopic Multiple Living  2 2 2       2     # Outcome Date GA Lbr Len/2nd Weight Sex Delivery Anes PTL Lv  2 Term 1978   8 lb (3.629 kg) M Vag-Spont   Y  1 Term 1975   7 lb 8 oz (3.402 kg) F Vag-Spont   Y      Past Medical History  Diagnosis Date  . GERD (gastroesophageal reflux disease)   . Arthritis   . Glaucoma   . Uveitis     right- chronic  . Glaucoma   . Atrophy of vagina   . Cystocele   . Rectocele     Past Surgical History  Procedure Laterality Date  . Cataract extraction, bilateral    . Tubal ligation      Current Outpatient Prescriptions on File Prior to Visit  Medication Sig Dispense Refill  . aspirin 81 MG tablet Take 81 mg by mouth daily.    . brimonidine (ALPHAGAN) 0.2 % ophthalmic solution Place 1 drop into the left eye 2 (two) times daily.     . cholecalciferol (VITAMIN D) 1000 UNITS tablet Take 1,000 Units by mouth daily.    Marland Kitchen denosumab (PROLIA) 60 MG/ML SOLN injection Inject 60 mg into the skin every 6 (six) months. Administer in upper arm, thigh, or abdomen    . dorzolamide-timolol (COSOPT) 22.3-6.8 MG/ML ophthalmic solution Place 1 drop into the left eye every 12 (twelve) hours.    Marland Kitchen estradiol (ESTRACE) 0.1 MG/GM  vaginal cream Place [id number] Applicatorfuls vaginally 2 (two) times a week. 1/2 gram twice weekly 42.5 g 3  . GLUCOSAMINE CHONDROITIN COMPLX PO Take by mouth.    . Multiple Vitamin (MULTIVITAMIN) tablet Take 1 tablet by mouth daily.    . Omega-3 Fatty Acids (FISH OIL) 1200 MG CAPS Take 2 by mouth daily    . ranitidine (ZANTAC) 300 MG tablet TAKE ONE TABLET BY MOUTH AT BEDTIME 90 tablet 1   No current facility-administered medications on file prior to visit.    Allergies  Allergen Reactions  . Xalatan [Latanoprost]     Caused eye to be red, swollen,itchy    Social History   Social History  . Marital Status: Married    Spouse Name: N/A  . Number of Children: N/A  . Years of Education: N/A   Occupational History  . Not on file.   Social History Main Topics  . Smoking status: Never Smoker   . Smokeless tobacco: Not on file  . Alcohol Use: Yes     Comment: wine daily  . Drug Use: No  . Sexual Activity: Not Currently    Birth Control/ Protection: Surgical   Other Topics Concern  . Not on file   Social History Narrative  Recently moved to [facility] from [place].   She is primary caregiver for her husband who has Alzheimers.   Two children.      Retired Engineer, production.      Desires CPR.   Has living will- SCANNED INTO CHART   Would not want prolonged life support if futile.                      Family History  Problem Relation Age of Onset  . Aortic stenosis Mother   . Heart disease Mother   . Osteoporosis Mother   . Cancer Father     prostate  . Heart disease Father   . Multiple sclerosis Daughter     The following portions of the patient's history were reviewed and updated as appropriate: allergies, current medications, past family history, past medical history, past social history, past surgical history and problem list.  Review of Systems NA   Objective:   BP 134/81 mmHg  Pulse 79  Ht 5\' 5"  (1.651 m)  Wt 122 lb 1.6 oz (55.384 kg)  BMI  20.32 kg/m2  PROCEDURE: Pessary Insertion  70 mm gel horn pessary with short stem inserted. Patient tolerated well.    Assessment:   1. Cystocele, third-degree with Valsalva 2. Moderate rectocele 3. Uterine prolapse to the introitus   Plan:   1. Pessary insertion - 70 mm gel horn short stem pessary, well tolerated. 2. Follow up in 2 weeks for pessary maintenance. 3. Estrace cream weekly for vaginal atrophy. 4. Trimo san gel weekly for prevention of vaginitis.  Claiborne Billings Rayburn PA-S Brayton Mars, MD   I have seen, interviewed, and examined the patient in conjunction with the [facility].A. student and affirm the diagnosis and management plan. Kamyia Thomason A. Haydan Wedig, MD, FACOG   Note: This dictation was prepared with Dragon dictation along with smaller phrase technology. Any transcriptional errors that result from this process are unintentional.

## 2016-01-07 ENCOUNTER — Telehealth: Payer: Self-pay | Admitting: Family Medicine

## 2016-01-07 NOTE — Telephone Encounter (Signed)
I have electronically submitted pt's info for Prolia insurance verification and will notify you once I have a response. Thank you. °

## 2016-01-12 ENCOUNTER — Encounter: Payer: Self-pay | Admitting: Family Medicine

## 2016-01-13 ENCOUNTER — Ambulatory Visit: Payer: Medicare Other | Admitting: Obstetrics and Gynecology

## 2016-01-20 ENCOUNTER — Ambulatory Visit (INDEPENDENT_AMBULATORY_CARE_PROVIDER_SITE_OTHER): Payer: Medicare Other | Admitting: Obstetrics and Gynecology

## 2016-01-20 ENCOUNTER — Encounter: Payer: Self-pay | Admitting: Obstetrics and Gynecology

## 2016-01-20 VITALS — BP 159/82 | HR 76 | Ht 65.0 in | Wt 122.1 lb

## 2016-01-20 DIAGNOSIS — N8110 Cystocele, unspecified: Secondary | ICD-10-CM | POA: Diagnosis not present

## 2016-01-20 DIAGNOSIS — N814 Uterovaginal prolapse, unspecified: Secondary | ICD-10-CM | POA: Insufficient documentation

## 2016-01-20 DIAGNOSIS — Z4689 Encounter for fitting and adjustment of other specified devices: Secondary | ICD-10-CM | POA: Diagnosis not present

## 2016-01-20 DIAGNOSIS — IMO0002 Reserved for concepts with insufficient information to code with codable children: Secondary | ICD-10-CM | POA: Insufficient documentation

## 2016-01-20 DIAGNOSIS — N816 Rectocele: Secondary | ICD-10-CM

## 2016-01-20 NOTE — Progress Notes (Signed)
Chief complaint: 1. Cystocele, third-degree with Valsalva 2. Moderate rectocele 3. Uterine prolapse to the introitus 4. Pessary maintenance  70 mm gel horn short stem pessary is being utilized. This is a 3 week follow-up since initial insertion. Patient is using Estrace cream weekly for vaginal atrophy and dryness and gel weekly for prevention of vaginitis  Patient denies vaginal bleeding or vaginal discharge. Patient reports occasional urinary leakage especially at night. She also is experiencing some incomplete defecation with bowel movements. No pelvic pain is present.  Past medical history, past surgical history, problem list, medications, and allergies are reviewed  OBJECTIVE: BP 159/82 mmHg  Pulse 76  Ht 5\' 5"  (1.651 m)  Wt 122 lb 1.6 oz (55.384 kg)  BMI 20.32 kg/m2  Pleasant white female in no acute distress. She is alert and oriented. Affect is appropriate. PELVIC: External Genitalia: Normal BUS: Normal Vagina: Normal estrogen effect; third-degree cystourethrocele with Valsalva; moderate rectocele; no significant secretions; no odor Cervix: Normal; prolapses to the introitus with Valsalva Uterus: Normal size, shape,consistency, mobile Adnexa: Normal RV: Normal external exam Bladder: Nontender  PROCEDURE: The 70 mm gel horn short stem pessary is removed, cleaned, and reinserted  ASSESSMENT: 1. Normal pessary maintenance 2. Cystocele, third-degree with Valsalva 3. Moderate rectocele 4. Uterine prolapse to the introitus  PLAN: 1. Follow up in 6 weeks for pessary maintenance. 2. Estrace cream weekly for vaginal atrophy. 3. Trimo san gel weekly for prevention of vaginitis.  A total of 15 minutes were spent face-to-face with the patient during this encounter and over half of that time dealt with counseling and coordination of care.   Brayton Mars, MD   Note:  This dictation was prepared with Dragon dictation along with smaller phrase technology. Any transcriptional errors that result from this process are unintentional.

## 2016-01-20 NOTE — Patient Instructions (Addendum)
1. Continue Trimosan gel weekly and Estrace cream weekly 2. Return in 6 weeks for follow-up

## 2016-01-22 NOTE — Telephone Encounter (Signed)
I have rec'd Christy [name]'s insurance verification and she has an estimated responsibility of 0%.  Please make pt aware this is an estimate and we will not know an exact amt until insurance(s) has/have paid.  I have sent a copy of the summary of benefits to be scanned into pt's chart.    Once pt recs injection, please let me know actual injection date so I can update the [facility] portal.  If you have any questions, please let me know.

## 2016-01-22 NOTE — Telephone Encounter (Signed)
Lm on pts vm requesting a call back 

## 2016-01-22 NOTE — Telephone Encounter (Signed)
Spoke to pt and advised. Ca lab scheduled.

## 2016-01-25 ENCOUNTER — Other Ambulatory Visit (INDEPENDENT_AMBULATORY_CARE_PROVIDER_SITE_OTHER): Payer: Medicare Other

## 2016-01-25 DIAGNOSIS — M810 Age-related osteoporosis without current pathological fracture: Secondary | ICD-10-CM | POA: Diagnosis not present

## 2016-01-25 LAB — CALCIUM: Calcium: 9.8 mg/dL (ref 8.4–10.5)

## 2016-01-29 ENCOUNTER — Ambulatory Visit (INDEPENDENT_AMBULATORY_CARE_PROVIDER_SITE_OTHER): Payer: Medicare Other

## 2016-01-29 DIAGNOSIS — M810 Age-related osteoporosis without current pathological fracture: Secondary | ICD-10-CM | POA: Diagnosis not present

## 2016-01-29 MED ORDER — DENOSUMAB 60 MG/ML ~~LOC~~ SOLN
60.0000 mg | Freq: Once | SUBCUTANEOUS | Status: AC
  Administered 2016-01-29: 60 mg via SUBCUTANEOUS

## 2016-03-01 ENCOUNTER — Ambulatory Visit: Payer: Medicare Other | Admitting: Obstetrics and Gynecology

## 2016-03-08 ENCOUNTER — Ambulatory Visit (INDEPENDENT_AMBULATORY_CARE_PROVIDER_SITE_OTHER): Payer: Medicare Other | Admitting: Obstetrics and Gynecology

## 2016-03-08 ENCOUNTER — Encounter: Payer: Self-pay | Admitting: Obstetrics and Gynecology

## 2016-03-08 VITALS — BP 137/82 | HR 89 | Ht 65.0 in | Wt 121.8 lb

## 2016-03-08 DIAGNOSIS — Z4689 Encounter for fitting and adjustment of other specified devices: Secondary | ICD-10-CM | POA: Diagnosis not present

## 2016-03-08 DIAGNOSIS — N8110 Cystocele, unspecified: Secondary | ICD-10-CM

## 2016-03-08 DIAGNOSIS — N816 Rectocele: Secondary | ICD-10-CM | POA: Diagnosis not present

## 2016-03-08 DIAGNOSIS — N814 Uterovaginal prolapse, unspecified: Secondary | ICD-10-CM | POA: Diagnosis not present

## 2016-03-08 DIAGNOSIS — IMO0002 Reserved for concepts with insufficient information to code with codable children: Secondary | ICD-10-CM

## 2016-03-08 NOTE — Patient Instructions (Signed)
1. Follow up in 8 weeks for pessary maintenance. 2. Estrace cream weekly for vaginal atrophy. 3. Trimo san gel weekly for prevention of vaginitis.

## 2016-03-08 NOTE — Progress Notes (Signed)
Chief complaint: 1. Cystocele, third-degree with Valsalva 2. Moderate rectocele 3. Uterine prolapse to the introitus 4. Pessary maintenance  70 mm gel horn short stem pessary is being utilized. This is a 6 week follow-up since last pessary maintenance. Patient is using Estrace cream weekly for vaginal atrophy and dryness and Trimosan gel weekly for prevention of vaginitis  Patient denies vaginal bleeding or vaginal discharge. Patient reports occasional urinary leakage especially at night. She also is experiencing some incomplete defecation with bowel movements. No pelvic pain is present.  Past medical history, past surgical history, problem list, medications, and allergies are reviewed  OBJECTIVE: BP 137/82 mmHg  Pulse 89  Ht 5\' 5"  ([id number] m)  Wt 121 lb 12.8 oz (55.248 kg)  BMI 20.27 kg/m2 Pleasant white female in no acute distress. She is alert and oriented. Affect is appropriate. PELVIC: External Genitalia: Normal BUS: Normal Vagina: Normal estrogen effect; third-degree cystourethrocele with Valsalva; moderate rectocele; no significant secretions; no odor Cervix: Normal; prolapses to the introitus with Valsalva; 7 mm cervical polyp and affect 7:00; minimal hyperemia and 12:00 of the cervix Uterus: Normal size, shape,consistency, mobile Adnexa: Normal RV: Normal external exam Bladder: Nontender  PROCEDURE: The 70 mm gel horn short stem pessary is removed, cleaned, and reinserted  ASSESSMENT: 1. Normal pessary maintenance 2. Cystocele, third-degree with Valsalva 3. Moderate rectocele 4. Uterine prolapse to the introitus  PLAN: 1. Follow up in 8 weeks for pessary maintenance. 2. Estrace cream weekly for vaginal atrophy. 3. Trimo san gel weekly for prevention of vaginitis.  A total of 15 minutes were spent face-to-face with the patient during this encounter and over half of that  time dealt with counseling and coordination of care.   Brayton Mars, MD  Note: This dictation was prepared with Dragon dictation along with smaller phrase technology. Any transcriptional errors that result from this process are unintentional.

## 2016-03-15 ENCOUNTER — Ambulatory Visit: Payer: Medicare Other | Admitting: Obstetrics and Gynecology

## 2016-05-03 ENCOUNTER — Ambulatory Visit (INDEPENDENT_AMBULATORY_CARE_PROVIDER_SITE_OTHER): Payer: Medicare Other | Admitting: Obstetrics and Gynecology

## 2016-05-03 ENCOUNTER — Encounter: Payer: Self-pay | Admitting: Obstetrics and Gynecology

## 2016-05-03 VITALS — BP 145/80 | HR 63 | Ht 65.0 in | Wt 125.7 lb

## 2016-05-03 DIAGNOSIS — IMO0002 Reserved for concepts with insufficient information to code with codable children: Secondary | ICD-10-CM

## 2016-05-03 DIAGNOSIS — N816 Rectocele: Secondary | ICD-10-CM | POA: Diagnosis not present

## 2016-05-03 DIAGNOSIS — N814 Uterovaginal prolapse, unspecified: Secondary | ICD-10-CM | POA: Diagnosis not present

## 2016-05-03 DIAGNOSIS — N8110 Cystocele, unspecified: Secondary | ICD-10-CM | POA: Diagnosis not present

## 2016-05-03 DIAGNOSIS — Z4689 Encounter for fitting and adjustment of other specified devices: Secondary | ICD-10-CM

## 2016-05-03 NOTE — Progress Notes (Signed)
Chief complaint: 1. Cystocele, third-degree with Valsalva 2. Moderate rectocele 3. Uterine prolapse to the introitus 4. Pessary maintenance (last visit 03/08/2016)  70 mm gel horn short stem pessary is being utilized. This is an 8 week follow-up since last pessary maintenance. Patient is using Estrace cream weekly for vaginal atrophy and dryness and Trimosan gel weekly for prevention of vaginitis  Patient denies vaginal bleeding or vaginal discharge. Patient reports occasional urinary leakage especially at night. She also is experiencing some incomplete defecation with bowel movements. No pelvic pain is present.  Past medical history, past surgical history, problem list, medications, and allergies are reviewed  OBJECTIVE: BP 137/82 mmHg  Pulse 89  Ht 5\' 5"  (1.651 m)  Wt 121 lb 12.8 oz (55.248 kg)  BMI 20.27 kg/m2 Pleasant white female in no acute distress. She is alert and oriented. Affect is appropriate. PELVIC: External Genitalia: Normal BUS: Normal Vagina: Normal estrogen effect; third-degree cystourethrocele with Valsalva; moderate rectocele; no significant secretions; no odor Cervix: Normal; prolapses to the introitus with Valsalva; 7 mm cervical polyp at 7:00 Uterus: Normal size, shape,consistency, mobile Adnexa: Normal RV: Normal external exam Bladder: Nontender  PROCEDURE: The 70 mm gel horn short stem pessary is removed, cleaned, and reinserted  ASSESSMENT: 1. Normal pessary maintenance 2. Cystocele, third-degree with Valsalva 3. Moderate rectocele 4. Uterine prolapse to the introitus  PLAN: 1. Follow up in 8 weeks for pessary maintenance. 2. Estrace cream weekly for vaginal atrophy. 3. Trimo san gel weekly for prevention of vaginitis.  A total of 15 minutes were spent face-to-face with the patient during this encounter and over half of that time dealt with  counseling and coordination of care.   Brayton Mars, MD  Note: This dictation was prepared with Dragon dictation along with smaller phrase technology. Any transcriptional errors that result from this process are unintentional.

## 2016-05-03 NOTE — Patient Instructions (Signed)
1. Return in 10 weeks for pessary maintenance 2. Continue using Trimosan gel intravaginal once a week

## 2016-05-06 ENCOUNTER — Other Ambulatory Visit: Payer: Self-pay | Admitting: Family Medicine

## 2016-05-06 DIAGNOSIS — Z1231 Encounter for screening mammogram for malignant neoplasm of breast: Secondary | ICD-10-CM

## 2016-05-24 DIAGNOSIS — H401122 Primary open-angle glaucoma, left eye, moderate stage: Secondary | ICD-10-CM | POA: Diagnosis not present

## 2016-06-01 ENCOUNTER — Other Ambulatory Visit: Payer: Self-pay | Admitting: Family Medicine

## 2016-06-01 ENCOUNTER — Ambulatory Visit
Admission: RE | Admit: 2016-06-01 | Discharge: 2016-06-01 | Disposition: A | Discharge status: Home / Self Care | Payer: Medicare Other | Source: Ambulatory Visit | Attending: Family Medicine | Admitting: Family Medicine

## 2016-06-01 DIAGNOSIS — Z1231 Encounter for screening mammogram for malignant neoplasm of breast: Secondary | ICD-10-CM

## 2016-06-09 ENCOUNTER — Ambulatory Visit (INDEPENDENT_AMBULATORY_CARE_PROVIDER_SITE_OTHER): Payer: Medicare Other | Admitting: Family Medicine

## 2016-06-09 ENCOUNTER — Encounter: Payer: Self-pay | Admitting: Family Medicine

## 2016-06-09 VITALS — BP 134/66 | HR 72 | Temp 97.7°F | Ht 65.0 in | Wt 122.0 lb

## 2016-06-09 DIAGNOSIS — K219 Gastro-esophageal reflux disease without esophagitis: Secondary | ICD-10-CM | POA: Diagnosis not present

## 2016-06-09 DIAGNOSIS — Z0000 Encounter for general adult medical examination without abnormal findings: Secondary | ICD-10-CM

## 2016-06-09 DIAGNOSIS — Z1159 Encounter for screening for other viral diseases: Secondary | ICD-10-CM

## 2016-06-09 DIAGNOSIS — M810 Age-related osteoporosis without current pathological fracture: Secondary | ICD-10-CM | POA: Diagnosis not present

## 2016-06-09 LAB — COMPREHENSIVE METABOLIC PANEL
ALT: 23 U/L (ref 0–35)
AST: 25 U/L (ref 0–37)
Albumin: 4.4 g/dL (ref 3.5–5.2)
Alkaline Phosphatase: 27 U/L — ABNORMAL LOW (ref 39–117)
BILIRUBIN TOTAL: 0.7 mg/dL (ref 0.2–1.2)
BUN: 14 mg/dL (ref 6–23)
CALCIUM: 8.9 mg/dL (ref 8.4–10.5)
CHLORIDE: 103 meq/L (ref 96–112)
CO2: 30 meq/L (ref 19–32)
Creatinine, Ser: 0.73 mg/dL (ref 0.40–1.20)
GFR: 83.39 mL/min (ref >60.00)
GLUCOSE: 98 mg/dL (ref 70–99)
POTASSIUM: 4.6 meq/L (ref 3.5–5.1)
Sodium: 138 mEq/L (ref 135–145)
Total Protein: 6.7 g/dL (ref 6.0–8.3)

## 2016-06-09 LAB — LIPID PANEL
CHOL/HDL RATIO: 3
Cholesterol: 225 mg/dL — ABNORMAL HIGH (ref 0–200)
HDL: 82.1 mg/dL (ref >39.00)
LDL CALC: 132 mg/dL — AB (ref 0–99)
NonHDL: 143.02
TRIGLYCERIDES: 55 mg/dL (ref 0.0–149.0)
VLDL: 11 mg/dL (ref 0.0–40.0)

## 2016-06-09 LAB — CBC WITH DIFFERENTIAL/PLATELET
BASOS PCT: 1 % (ref 0.0–3.0)
Basophils Absolute: 0.1 10*3/uL (ref 0.0–0.1)
Eosinophils Absolute: 0.2 10*3/uL (ref 0.0–0.7)
Eosinophils Relative: 2.7 % (ref 0.0–5.0)
HEMATOCRIT: 43.2 % (ref 36.0–46.0)
Hemoglobin: 14.8 g/dL (ref 12.0–15.0)
LYMPHS ABS: 1.7 10*3/uL (ref 0.7–4.0)
LYMPHS PCT: 30.3 % (ref 12.0–46.0)
MCHC: 34.2 g/dL (ref 30.0–36.0)
MCV: 93.8 fl (ref 78.0–100.0)
MONOS PCT: 6.6 % (ref 3.0–12.0)
Monocytes Absolute: 0.4 10*3/uL (ref 0.1–1.0)
NEUTROS ABS: 3.3 10*3/uL (ref 1.4–7.7)
NEUTROS PCT: 59.4 % (ref 43.0–77.0)
PLATELETS: 207 10*3/uL (ref 150.0–400.0)
RBC: 4.6 Mil/uL (ref 3.87–5.11)
RDW: 13.9 % (ref 11.5–15.5)
WBC: 5.6 10*3/uL (ref 4.0–10.5)

## 2016-06-09 LAB — TSH: TSH: 0.96 u[IU]/mL (ref 0.35–4.50)

## 2016-06-09 LAB — VITAMIN D 25 HYDROXY (VIT D DEFICIENCY, FRACTURES): VITD: 39 ng/mL (ref 30.00–100.00)

## 2016-06-09 NOTE — Progress Notes (Signed)
Pre visit review using our clinic review tool, if applicable. No additional management support is needed unless otherwise documented below in the visit note. 

## 2016-06-09 NOTE — Patient Instructions (Signed)
Great to see you. We will call you with your results from today and you can view them online. 

## 2016-06-09 NOTE — Progress Notes (Signed)
Subjective:    Patient ID: Christy [name], female    DOB: [birth year], 70 y.o.   MRN: [id number]  HPI  Very pleasant 70 yo healthy female here  for medicare wellness visit and follow up of chronic medical conditions.  I have personally reviewed the Medicare Annual Wellness questionnaire and have noted 1. The patient's medical and social history 2. Their use of alcohol, tobacco or illicit drugs 3. Their current medications and supplements 4. The patient's functional ability including ADL's, fall risks, home safety risks and hearing or visual             impairment. 5. Diet and physical activities 6. Evidence for depression or mood disorders  End of life wishes discussed and updated in Social History.  The roster of all physicians providing medical care to patient - is listed in the Snapshot section of the chart.  Pneumovax 01/05/10 Prevnar 13 07/10/14 Td 12/21/06 Mammogram 06/01/16 Colonoscopy 01/14/09- [facility]- 10 year recall. Zoster 02/24/06 Eye exam 05/24/16 - [facility]  Pelvic organ prolapse- now seeing Dr. Enzo Bi. Has a new pessary.  Topical estrogen weekly has been helping.   Osteoporosis- Bone density- 05/2015 - improvement- osteopenia. She does take Calcium/Vit D. Could not tolerate fosamax due to reflux.  Has been receiving prolia injections here- last injection was on 01/29/16  Husband is now at memory care.  She is going care giver groups.    GERD- symptoms have been well controlled on current dose of Zantac.   Lab Results  Component Value Date   CHOL 203 (H) 06/03/2015   HDL 72.80 06/03/2015   LDLCALC 119 (H) 06/03/2015   TRIG 56.0 06/03/2015   CHOLHDL 3 06/03/2015   Lab Results  Component Value Date   CREATININE 0.65 06/03/2015   Lab Results  Component Value Date   TSH 0.98 06/03/2015   Lab Results  Component Value Date   WBC 6.1 06/03/2015   HGB 14.7 06/03/2015   HCT 45.2 06/03/2015   MCV 98.0 06/03/2015   PLT 186.0 06/03/2015      Patient  Active Problem List   Diagnosis Date Noted  . Cystocele 01/20/2016  . Rectocele 01/20/2016  . Uterine prolapse 01/20/2016  . GERD (gastroesophageal reflux disease) 06/09/2015  . Prolapse of female pelvic organs 06/03/2014  . Osteoporosis 09/02/2013  . Encounter for Medicare annual wellness exam 05/06/2013  . Chronic uveitis of right eye 05/06/2013  . Arthritis    Past Medical History:  Diagnosis Date  . Arthritis   . Atrophy of vagina   . Cystocele   . GERD (gastroesophageal reflux disease)   . Glaucoma   . Glaucoma   . Rectocele   . Uveitis    right- chronic   Past Surgical History:  Procedure Laterality Date  . CATARACT EXTRACTION, BILATERAL    . TUBAL LIGATION     Social History  Substance Use Topics  . Smoking status: Never Smoker  . Smokeless tobacco: Not on file  . Alcohol use Yes     Comment: wine daily   Family History  Problem Relation Age of Onset  . Aortic stenosis Mother   . Heart disease Mother   . Osteoporosis Mother   . Cancer Father     prostate  . Heart disease Father   . Multiple sclerosis Daughter   . Breast cancer Maternal Aunt    Allergies  Allergen Reactions  . Xalatan [Latanoprost]     Caused eye to be red, swollen,itchy   Current Outpatient Prescriptions on  File Prior to Visit  Medication Sig Dispense Refill  . aspirin 81 MG tablet Take 81 mg by mouth daily.    . brimonidine (ALPHAGAN) 0.2 % ophthalmic solution Place 1 drop into the left eye 2 (two) times daily.     . cholecalciferol (VITAMIN D) 1000 UNITS tablet Take 1,000 Units by mouth daily.    Marland Kitchen denosumab (PROLIA) 60 MG/ML SOLN injection Inject 60 mg into the skin every 6 (six) months. Administer in upper arm, thigh, or abdomen    . dorzolamide-timolol (COSOPT) 22.3-6.8 MG/ML ophthalmic solution Place 1 drop into the left eye every 12 (twelve) hours.    Marland Kitchen estradiol (ESTRACE) 0.1 MG/GM vaginal cream Place [id number] Applicatorfuls vaginally 2 (two) times a week. 1/2 gram twice weekly  (Patient taking differently: Place [id number] Applicatorfuls vaginally once a week. 1/2 gram twice weekly) 42.5 g 3  . GLUCOSAMINE CHONDROITIN COMPLX PO Take by mouth.    . Multiple Vitamin (MULTIVITAMIN) tablet Take 1 tablet by mouth daily.    . Omega-3 Fatty Acids (FISH OIL) 1200 MG CAPS Take 2 by mouth daily    . ranitidine (ZANTAC) 300 MG tablet TAKE ONE TABLET BY MOUTH AT BEDTIME 90 tablet 1   No current facility-administered medications on file prior to visit.    The PMH, PSH, Social History, Family History, Medications, and allergies have been reviewed in [facility], and have been updated if relevant.   Review of Systems  Constitutional: Negative.   HENT: Negative.   Eyes: Negative.   Respiratory: Negative.   Cardiovascular: Negative.   Gastrointestinal: Negative.   Endocrine: Negative.   Genitourinary: Negative.   Musculoskeletal: Negative.   Skin: Negative.   Allergic/Immunologic: Negative.   Neurological: Negative.   Hematological: Negative.   Psychiatric/Behavioral: Negative.   All other systems reviewed and are negative.     Wt Readings from Last 3 Encounters:  06/09/16 122 lb (55.3 kg)  05/03/16 125 lb 11.2 oz (57 kg)  03/08/16 121 lb 12.8 oz (55.2 kg)     Objective:   Physical Exam BP 134/66   Pulse 72   Temp 97.7 F (36.5 C) (Oral)   Ht 5\' 5"  (1.651 m)   Wt 122 lb (55.3 kg)   SpO2 97%   BMI 20.30 kg/m   General:  Well-developed,well-nourished,in no acute distress; alert,appropriate and cooperative throughout examination Head:  normocephalic and atraumatic.   Eyes:  vision grossly intact, pupils equal, pupils round, and pupils reactive to light.   Ears:  R ear normal and L ear normal.   Nose:  no external deformity.   Mouth:  good dentition.   Neck:  No deformities, masses, or tenderness noted. Lungs:  Normal respiratory effort, chest expands symmetrically. Lungs are clear to auscultation, no crackles or wheezes. Heart:  Normal rate and regular rhythm. S1  and S2 normal without gallop, murmur, click, rub or other extra sounds. Abdomen:  Bowel sounds positive,abdomen soft and non-tender without masses, organomegaly or hernias noted. Msk:  No deformity or scoliosis noted of thoracic or lumbar spine.   Extremities:  No clubbing, cyanosis, edema, or deformity noted with normal full range of motion of all joints.   Neurologic:  alert & oriented X3 and gait normal.   Skin:  Intact without suspicious lesions or rashes Cervical Nodes:  No lymphadenopathy noted Axillary Nodes:  No palpable lymphadenopathy Psych:  Cognition and judgment appear intact. Alert and cooperative with normal attention span and concentration. No apparent delusions, illusions, hallucinations  Assessment & Plan:

## 2016-06-09 NOTE — Assessment & Plan Note (Signed)
The patients weight, height, BMI and visual acuity have been recorded in the chart.  Cognitive function assessed.   I have made referrals, counseling and provided education to the patient based review of the above and I have provided the pt with a written personalized care plan for preventive services.  Orders Placed This Encounter  Procedures  . CBC with Differential/Platelet  . Comprehensive metabolic panel  . Lipid panel  . TSH  . Vitamin D, 25-hydroxy  . Hepatitis C Antibody

## 2016-06-09 NOTE — Assessment & Plan Note (Signed)
Symptoms controlled with H2 blocker. No changes made today.

## 2016-06-09 NOTE — Assessment & Plan Note (Signed)
Improving with prolia.

## 2016-06-10 LAB — HEPATITIS C ANTIBODY: HCV Ab: NEGATIVE

## 2016-06-28 ENCOUNTER — Telehealth: Payer: Self-pay | Admitting: Family Medicine

## 2016-06-28 NOTE — Telephone Encounter (Signed)
Christy [name]'s Prolia injection will be due after 07/30/2016, so I have electronically submitted her info for [place] verification and will notify you once I have a response. Thank you.

## 2016-07-12 NOTE — Telephone Encounter (Signed)
The only summary of benefits available online was the "instant verification" which have been incorrect in the past.  Spoke w/Rane at [facility] and she is going to re-verify and forward.

## 2016-07-19 ENCOUNTER — Encounter: Payer: Self-pay | Admitting: Obstetrics and Gynecology

## 2016-07-19 ENCOUNTER — Ambulatory Visit (INDEPENDENT_AMBULATORY_CARE_PROVIDER_SITE_OTHER): Payer: Medicare Other | Admitting: Obstetrics and Gynecology

## 2016-07-19 VITALS — BP 142/82 | HR 77 | Ht 65.0 in | Wt 124.6 lb

## 2016-07-19 DIAGNOSIS — Z4689 Encounter for fitting and adjustment of other specified devices: Secondary | ICD-10-CM

## 2016-07-19 DIAGNOSIS — N816 Rectocele: Secondary | ICD-10-CM | POA: Diagnosis not present

## 2016-07-19 DIAGNOSIS — N814 Uterovaginal prolapse, unspecified: Secondary | ICD-10-CM

## 2016-07-19 NOTE — Patient Instructions (Signed)
1. Follow up in 12 weeks for pessary maintenance. 2. Estrace cream weekly for vaginal atrophy. 3. Trimo san gel weekly for prevention of vaginitis.

## 2016-07-19 NOTE — Progress Notes (Signed)
Chief complaint: 1. Cystocele, third-degree with Valsalva 2. Moderate rectocele 3. Uterine prolapse to the introitus 4. Pessary maintenance (last visit 05/03/2016)  70 mm gel horn short stem pessary is being utilized. This is a 10 week follow-up since last pessary maintenance. Patient is using Estrace cream weekly for vaginal atrophy and dryness and Trimosan gel weekly for prevention of vaginitis  Patient denies vaginal bleeding or vaginal discharge. Patient reports occasional urinary leakage especially at night. She also is experiencing some incomplete defecation with bowel movements. No pelvic pain is present.  Past medical history, past surgical history, problem list, medications, and allergies are reviewed.  OBJECTIVE: BP (!) 142/82   Pulse 77   Ht 5\' 5"  (1.651 m)   Wt 124 lb 9.6 oz (56.5 kg)   BMI 20.73 kg/m  Pleasant white female in no acute distress. She is alert and oriented. Affect is appropriate. PELVIC: External Genitalia: Normal BUS: Normal Vagina: Normal estrogen effect; third-degree cystourethrocele with Valsalva; moderate rectocele; no significant secretions; no odor Cervix: Normal; prolapses to the introitus with Valsalva; 7 mm cervical polyp at 7:00 Uterus: Normal size, shape,consistency, mobile Adnexa: Normal RV: Normal external exam Bladder: Nontender  PROCEDURE: The 70 mm gel horn short stem pessary is removed, cleaned, and reinserted  ASSESSMENT: 1. Normal pessary maintenance 2. Cystocele, third-degree with Valsalva 3. Moderate rectocele 4. Uterine prolapse to the introitus  PLAN: 1. Follow up in 12 weeks for pessary maintenance. 2. Estrace cream weekly for vaginal atrophy. 3. Trimo san gel weekly for prevention of vaginitis.  A total of 15 minutes were spent face-to-face with the patient during this encounter and over half of that time dealt with  counseling and coordination of care.  Brayton Mars, MD  Note: This dictation was prepared with Dragon dictation along with smaller phrase technology. Any transcriptional errors that result from this process are unintentional.

## 2016-07-29 ENCOUNTER — Encounter: Payer: Self-pay | Admitting: Family Medicine

## 2016-08-04 NOTE — Telephone Encounter (Signed)
Earl Lagos, I believe there was a summary of benefits for her, but it was an "instant" verification, which as I stated I have had problems w/them not being correct in the past.  When you meet w/Jeff and/or Joycelyn Schmid you can discuss these w/her. Since they've made updates to the system, maybe they improved these as well.  Let me know if you have questions.  Thank you.

## 2016-08-05 ENCOUNTER — Other Ambulatory Visit (INDEPENDENT_AMBULATORY_CARE_PROVIDER_SITE_OTHER): Payer: Medicare Other

## 2016-08-05 DIAGNOSIS — M810 Age-related osteoporosis without current pathological fracture: Secondary | ICD-10-CM

## 2016-08-05 LAB — CALCIUM: CALCIUM: 9.8 mg/dL (ref 8.4–10.5)

## 2016-08-05 NOTE — Telephone Encounter (Signed)
Per Prolia's summary of benefits, Ms. Chamberlain's estimated responsibility will be $0.  Please make her aware this is only estimate and we will not know the exact amt until both insurances have paid.    Waynetta, the summary of benefits should be clipped to her card in the green notebook as we discussed.  Could you please get it and send to scan?  If you have any questions let me know.  Thank you.

## 2016-08-05 NOTE — Telephone Encounter (Signed)
Spoke to pt and advised. Ca lab scheduled.

## 2016-08-05 NOTE — Telephone Encounter (Signed)
See additional phone note. 

## 2016-08-08 NOTE — Telephone Encounter (Signed)
Lm on pts vm and advised ok to schedule prolia injection

## 2016-08-11 ENCOUNTER — Ambulatory Visit (INDEPENDENT_AMBULATORY_CARE_PROVIDER_SITE_OTHER): Payer: Medicare Other

## 2016-08-11 DIAGNOSIS — M810 Age-related osteoporosis without current pathological fracture: Secondary | ICD-10-CM

## 2016-08-11 MED ORDER — DENOSUMAB 60 MG/ML ~~LOC~~ SOLN
60.0000 mg | Freq: Once | SUBCUTANEOUS | Status: AC
  Administered 2016-08-11: 60 mg via SUBCUTANEOUS

## 2016-08-18 ENCOUNTER — Ambulatory Visit: Payer: Medicare Other

## 2016-10-05 ENCOUNTER — Encounter: Payer: Self-pay | Admitting: *Deleted

## 2016-10-05 ENCOUNTER — Emergency Department
Admission: EM | Admit: 2016-10-05 | Discharge: 2016-10-05 | Disposition: A | Discharge status: Home / Self Care | Payer: Medicare Other | Attending: Emergency Medicine | Admitting: Emergency Medicine

## 2016-10-05 DIAGNOSIS — R319 Hematuria, unspecified: Secondary | ICD-10-CM

## 2016-10-05 DIAGNOSIS — N390 Urinary tract infection, site not specified: Secondary | ICD-10-CM | POA: Insufficient documentation

## 2016-10-05 DIAGNOSIS — E871 Hypo-osmolality and hyponatremia: Secondary | ICD-10-CM | POA: Diagnosis not present

## 2016-10-05 DIAGNOSIS — Z7982 Long term (current) use of aspirin: Secondary | ICD-10-CM | POA: Insufficient documentation

## 2016-10-05 DIAGNOSIS — J069 Acute upper respiratory infection, unspecified: Secondary | ICD-10-CM | POA: Diagnosis not present

## 2016-10-05 LAB — CBC WITH DIFFERENTIAL/PLATELET
BASOS ABS: 0 10*3/uL (ref 0–0.1)
BASOS PCT: 0 %
Eosinophils Absolute: 0.1 10*3/uL (ref 0–0.7)
Eosinophils Relative: 1 %
HEMATOCRIT: 40.2 % (ref 35.0–47.0)
HEMOGLOBIN: 13.3 g/dL (ref 12.0–16.0)
LYMPHS PCT: 12 %
Lymphs Abs: 1.4 10*3/uL (ref 1.0–3.6)
MCH: 32.2 pg (ref 26.0–34.0)
MCHC: 33.1 g/dL (ref 32.0–36.0)
MCV: 97.2 fL (ref 80.0–100.0)
MONO ABS: 0.6 10*3/uL (ref 0.2–0.9)
Monocytes Relative: 5 %
NEUTROS ABS: 9.3 10*3/uL — AB (ref 1.4–6.5)
NEUTROS PCT: 82 %
Platelets: 163 10*3/uL (ref 150–440)
RBC: 4.14 MIL/uL (ref 3.80–5.20)
RDW: 13.4 % (ref 11.5–14.5)
WBC: 11.4 10*3/uL — ABNORMAL HIGH (ref 3.6–11.0)

## 2016-10-05 LAB — BASIC METABOLIC PANEL
ANION GAP: 10 (ref 5–15)
BUN: 14 mg/dL (ref 6–20)
CALCIUM: 9 mg/dL (ref 8.9–10.3)
CHLORIDE: 94 mmol/L — AB (ref 101–111)
CO2: 25 mmol/L (ref 22–32)
Creatinine, Ser: 0.57 mg/dL (ref 0.44–1.00)
GFR calc non Af Amer: >60 mL/min (ref >60)
GLUCOSE: 121 mg/dL — AB (ref 65–99)
POTASSIUM: 3.7 mmol/L (ref 3.5–5.1)
Sodium: 129 mmol/L — ABNORMAL LOW (ref 135–145)

## 2016-10-05 LAB — URINALYSIS, COMPLETE (UACMP) WITH MICROSCOPIC
Bilirubin Urine: NEGATIVE
GLUCOSE, UA: NEGATIVE mg/dL
Ketones, ur: NEGATIVE mg/dL
NITRITE: NEGATIVE
PH: 6 (ref 5.0–8.0)
PROTEIN: 100 mg/dL — AB
Specific Gravity, Urine: 1.003 — ABNORMAL LOW (ref 1.005–1.030)

## 2016-10-05 MED ORDER — SODIUM CHLORIDE 0.9 % IV BOLUS (SEPSIS)
1000.0000 mL | Freq: Once | INTRAVENOUS | Status: AC
  Administered 2016-10-05: 1000 mL via INTRAVENOUS

## 2016-10-05 MED ORDER — CEFTRIAXONE SODIUM-DEXTROSE 1-3.74 GM-% IV SOLR
1.0000 g | Freq: Once | INTRAVENOUS | Status: AC
  Administered 2016-10-05: 1 g via INTRAVENOUS
  Filled 2016-10-05: qty 50

## 2016-10-05 MED ORDER — CEPHALEXIN 500 MG PO CAPS
500.0000 mg | ORAL_CAPSULE | Freq: Two times a day (BID) | ORAL | 0 refills | Status: DC
Start: 2016-10-05 — End: 2016-10-18

## 2016-10-05 MED ORDER — DEXTROSE 5 % IV SOLN: 1.0000 g | Freq: Once | INTRAVENOUS | Status: DC

## 2016-10-05 NOTE — ED Triage Notes (Signed)
Pt ambulatory to triage.  Pt has hematuria today.  Pt has pessary and treated by dr Enzo Bi.  Denies back pain.  No n/v/d.  Pt alert.

## 2016-10-05 NOTE — ED Provider Notes (Signed)
[facility] Emergency Department Provider Note ____________________________________________   I have reviewed the triage vital signs and the triage nursing note.  HISTORY  Chief Complaint Hematuria   Historian Patient and her sister-in-law  HPI Christy [name] is a 71 y.o. female who lives at [facility] [place], presents with several days of burning with urination, and blood in the urine today. No history of prior UTIs. She is followed by OB/GYN for rectocele, and has a pessary in place which typically has not given her any trouble. Denies abdominal pain. Denies nausea. Denies body aches. She states that she feels anxious being here at the doctor. Fever. Denies nausea or vomiting.    Past Medical History:  Diagnosis Date  . Arthritis   . Atrophy of vagina   . Cystocele   . GERD (gastroesophageal reflux disease)   . Glaucoma   . Glaucoma   . Rectocele   . Uveitis    right- chronic    Patient Active Problem List   Diagnosis Date Noted  . Cystocele 01/20/2016  . Rectocele 01/20/2016  . Uterine prolapse 01/20/2016  . GERD (gastroesophageal reflux disease) 06/09/2015  . Prolapse of female pelvic organs 06/03/2014  . Osteoporosis 09/02/2013  . Encounter for Medicare annual wellness exam 05/06/2013  . Chronic uveitis of right eye 05/06/2013  . Arthritis     Past Surgical History:  Procedure Laterality Date  . CATARACT EXTRACTION, BILATERAL    . TUBAL LIGATION      Prior to Admission medications   Medication Sig Start Date End Date Taking? Authorizing Provider  aspirin 81 MG tablet Take 81 mg by mouth daily.    Historical Provider, MD  brimonidine (ALPHAGAN) 0.2 % ophthalmic solution Place 1 drop into the left eye 2 (two) times daily.     Historical Provider, MD  cephALEXin (KEFLEX) 500 MG capsule Take 1 capsule (500 mg total) by mouth 2 (two) times daily. 10/05/16   Lisa Roca, MD  cholecalciferol (VITAMIN D) 1000 UNITS tablet Take 1,000 Units by mouth  daily.    Historical Provider, MD  denosumab (PROLIA) 60 MG/ML SOLN injection Inject 60 mg into the skin every 6 (six) months. Administer in upper arm, thigh, or abdomen    Historical Provider, MD  dorzolamide-timolol (COSOPT) 22.3-6.8 MG/ML ophthalmic solution Place 1 drop into the left eye every 12 (twelve) hours.    Historical Provider, MD  estradiol (ESTRACE) 0.1 MG/GM vaginal cream Place [id number] Applicatorfuls vaginally 2 (two) times a week. 1/2 gram twice weekly Patient taking differently: Place [id number] Applicatorfuls vaginally once a week. 1/2 gram twice weekly 12/10/15   Alanda Slim Defrancesco, MD  GLUCOSAMINE CHONDROITIN COMPLX PO Take by mouth.    Historical Provider, MD  Multiple Vitamin (MULTIVITAMIN) tablet Take 1 tablet by mouth daily.    Historical Provider, MD  Omega-3 Fatty Acids (FISH OIL) 1200 MG CAPS Take 2 by mouth daily    Historical Provider, MD  ranitidine (ZANTAC) 300 MG tablet TAKE ONE TABLET BY MOUTH AT BEDTIME 10/26/15   Lucille Passy, MD    Allergies  Allergen Reactions  . Xalatan [Latanoprost]     Caused eye to be red, swollen,itchy    Family History  Problem Relation Age of Onset  . Aortic stenosis Mother   . Heart disease Mother   . Osteoporosis Mother   . Cancer Father     prostate  . Heart disease Father   . Multiple sclerosis Daughter   . Breast cancer Maternal Aunt  Social History Social History  Substance Use Topics  . Smoking status: Never Smoker  . Smokeless tobacco: Never Used  . Alcohol use Yes     Comment: wine daily    Review of Systems  Constitutional: Negative for fever. Eyes: Negative for visual changes. ENT: Negative for sore throat. Cardiovascular: Negative for chest pain. Respiratory: Negative for shortness of breath. Gastrointestinal: Negative for abdominal pain, vomiting and diarrhea. Genitourinary: Negative for dysuria. Musculoskeletal: Negative for back pain. Skin: Negative for rash. Neurological: Negative for headache. 10  point Review of Systems otherwise negative ____________________________________________   PHYSICAL EXAM:  VITAL SIGNS: ED Triage Vitals  Enc Vitals Group     BP 10/05/16 2055 (!) 159/120     Pulse Rate 10/05/16 2055 100     Resp 10/05/16 2055 18     Temp 10/05/16 2055 98.7 F (37.1 C)     Temp Source 10/05/16 2055 Oral     SpO2 10/05/16 2055 99 %     Weight 10/05/16 2056 120 lb (54.4 kg)     Height 10/05/16 2056 5\' 5"  (1.651 m)     Head Circumference --      Peak Flow --      Pain Score 10/05/16 2056 0     Pain Loc --      Pain Edu? --      Excl. in [place]? --      Constitutional: Alert and oriented. Well appearing and in no distress.  She seems somewhat anxious. HEENT   Head: Normocephalic and atraumatic.      Eyes: Conjunctivae are normal. PERRL. Normal extraocular movements.      Ears:         Nose: No congestion/rhinnorhea.   Mouth/Throat: Mucous membranes are moist.   Neck: No stridor. Cardiovascular/Chest: Slightly Tachycardic rate, regular rhythm.  No murmurs, rubs, or gallops. Respiratory: Normal respiratory effort without tachypnea nor retractions. Breath sounds are clear and equal bilaterally. No wheezes/rales/rhonchi. Gastrointestinal: Soft. No distention, no guarding, no rebound. Nontender.    Genitourinary/rectal:Deferred Musculoskeletal: Nontender with normal range of motion in all extremities. No joint effusions.  No lower extremity tenderness.  No edema. Neurologic:  Normal speech and language. No gross or focal neurologic deficits are appreciated. Skin:  Skin is warm, dry and intact. No rash noted. Psychiatric: Mood and affect are normal. Speech and behavior are normal. Patient exhibits appropriate insight and judgment.   ____________________________________________  LABS (pertinent positives/negatives)  Labs Reviewed  URINALYSIS, COMPLETE (UACMP) WITH MICROSCOPIC - Abnormal; Notable for the following:       Result Value   Color, Urine RED (*)     APPearance HAZY (*)    Specific Gravity, Urine 1.003 (*)    Hgb urine dipstick LARGE (*)    Protein, ur 100 (*)    Leukocytes, UA LARGE (*)    Bacteria, UA MANY (*)    Squamous Epithelial / LPF 0-5 (*)    All other components within normal limits  CBC WITH DIFFERENTIAL/PLATELET - Abnormal; Notable for the following:    WBC 11.4 (*)    Neutro Abs 9.3 (*)    All other components within normal limits  BASIC METABOLIC PANEL - Abnormal; Notable for the following:    Sodium 129 (*)    Chloride 94 (*)    Glucose, Bld 121 (*)    All other components within normal limits  URINE CULTURE  CULTURE, BLOOD (ROUTINE X 2)  CULTURE, BLOOD (ROUTINE X 2)    ____________________________________________  EKG I, Lisa Roca, MD, the attending physician have personally viewed and interpreted all ECGs.  None ____________________________________________  RADIOLOGY All Xrays were viewed by me. Imaging interpreted by Radiologist.  None __________________________________________  PROCEDURES  Procedure(s) performed: None  Critical Care performed: None  ____________________________________________   ED COURSE / ASSESSMENT AND PLAN  Pertinent labs & imaging results that were available during my care of the patient were reviewed by me and considered in my medical decision making (see chart for details).   Ms. Padden has clinical symptoms of cystitis, and it UA consistent with urinary tract infection. A culture was sent. She seems quite anxious, and her initial blood pressure was pretty high and she does not have a high blood pressure diagnosis, but states it's typically high when she goes to the doctor's office. I did place an IV to give her dose of Rocephin given symptoms, and just to check a set of blood work. Her heart rate was initially in the 90s, Talk to her seems to jump up to the low 100s like 104.  Laboratory studies show slightly elevated white blood cell count with left  shift. She has a hyponatremia at 129. I'm going to give her a liter bolus for that.  I spoke to her about all overnight observation in the hospital due to the elevated white blood cell count, and hyponatremia, and occasional slight elevated heart rate, but she states that she feels like she is just nervous here and she doesn't think that she would sleep very well. She like to go home to [facility] and her sister will stay with her. She understands the possible risk of worsening. I don't have a high suspicion for sepsis.  She understands the risks and benefits of observation versus discharge home and return for any worsening. She would really like to go home. I think this is okay. I asked her to follow closely with her primary care doctor.  I will add on blood cultures, although it is after her antibiotics at this point.  Patient is completing her IV fluid bolus. The patient care transferred to Dr. Owens Shark at 11:15 PM. I've already spoke with the patient about disposition and completed her discharge instructions and prescription for Keflex.  She knows that if she changes her mind about wanting to be admitted/observation overnight just to let her nurse or Dr. Owens Shark know.  CONSULTATIONS:   None   Patient / Family / Caregiver informed of clinical course, medical decision-making process, and agree with plan.   I discussed return precautions, follow-up instructions, and discharge instructions with patient and/or family.   ___________________________________________   FINAL CLINICAL IMPRESSION(S) / ED DIAGNOSES   Final diagnoses:  Urinary tract infection with hematuria, site unspecified  Hyponatremia              Note: This dictation was prepared with Dragon dictation. Any transcriptional errors that result from this process are unintentional    Lisa Roca, MD 10/05/16 2312

## 2016-10-05 NOTE — Discharge Instructions (Signed)
You were evaluated for burning with urination, and found to have urinary tract infection and were treated with 24 hour dose of IV antibiotic called Rocephin. Start your pill Keflex antibiotic tomorrow.  Return to the emergency department for any worsening condition including weakness, confusion or altered mental status, fever, abdominal pain, dizziness, passing out, or any other symptoms concerning to you.  As we discussed, you do not want to stay overnight in the emergency department, and I do want you to follow up very closely with her primary doctor for recheck.

## 2016-10-06 ENCOUNTER — Telehealth: Payer: Self-pay | Admitting: Obstetrics and Gynecology

## 2016-10-06 NOTE — Telephone Encounter (Signed)
error 

## 2016-10-08 LAB — URINE CULTURE: Culture: >=100000 — AB

## 2016-10-10 LAB — CULTURE, BLOOD (ROUTINE X 2)
CULTURE: NO GROWTH
CULTURE: NO GROWTH

## 2016-10-12 ENCOUNTER — Encounter: Payer: Self-pay | Admitting: Family Medicine

## 2016-10-12 ENCOUNTER — Ambulatory Visit (INDEPENDENT_AMBULATORY_CARE_PROVIDER_SITE_OTHER): Payer: Medicare Other | Admitting: Family Medicine

## 2016-10-12 DIAGNOSIS — N3001 Acute cystitis with hematuria: Secondary | ICD-10-CM

## 2016-10-12 DIAGNOSIS — H209 Unspecified iridocyclitis: Secondary | ICD-10-CM | POA: Diagnosis not present

## 2016-10-12 DIAGNOSIS — H4089 Other specified glaucoma: Secondary | ICD-10-CM

## 2016-10-12 DIAGNOSIS — N390 Urinary tract infection, site not specified: Secondary | ICD-10-CM | POA: Insufficient documentation

## 2016-10-12 DIAGNOSIS — H409 Unspecified glaucoma: Secondary | ICD-10-CM | POA: Insufficient documentation

## 2016-10-12 LAB — POC URINALSYSI DIPSTICK (AUTOMATED)
Bilirubin, UA: NEGATIVE
Blood, UA: NEGATIVE
GLUCOSE UA: NEGATIVE
Ketones, UA: NEGATIVE
LEUKOCYTES UA: NEGATIVE
NITRITE UA: NEGATIVE
PROTEIN UA: NEGATIVE
Spec Grav, UA: 1.01
UROBILINOGEN UA: NEGATIVE
pH, UA: 7

## 2016-10-12 NOTE — Progress Notes (Signed)
Subjective:   Patient ID: Christy [name], female    DOB: 04/19/1946, 71 y.o.   MRN: [id number]  Christy [name] is a pleasant 71 y.o. year old female who presents to clinic today with Hospitalization Follow-up  on 10/12/2016  HPI:  Presented to [facility] on 10/05/16 with hematuria and increased urinary frequency. Notes reviewed.  UA pos for LE, blood. WBC elevated to 11.4 Urine cx sent- grew >100,000 E coli sensitive to cephalosporins. IV rocephin, sent home on keflex- has one dose left.  Symptoms have all resolved.  "I feel great."  Lab Results  Component Value Date   WBC 11.4 (H) 10/05/2016   HGB 13.3 10/05/2016   HCT 40.2 10/05/2016   MCV 97.2 10/05/2016   PLT 163 10/05/2016   Lab Results  Component Value Date   CREATININE 0.57 10/05/2016   Current Outpatient Prescriptions on File Prior to Visit  Medication Sig Dispense Refill  . aspirin 81 MG tablet Take 81 mg by mouth daily.    . brimonidine (ALPHAGAN) 0.2 % ophthalmic solution Place 1 drop into the left eye 2 (two) times daily.     . cephALEXin (KEFLEX) 500 MG capsule Take 1 capsule (500 mg total) by mouth 2 (two) times daily. 14 capsule 0  . cholecalciferol (VITAMIN D) 1000 UNITS tablet Take 1,000 Units by mouth daily.    Marland Kitchen denosumab (PROLIA) 60 MG/ML SOLN injection Inject 60 mg into the skin every 6 (six) months. Administer in upper arm, thigh, or abdomen    . dorzolamide-timolol (COSOPT) 22.3-6.8 MG/ML ophthalmic solution Place 1 drop into the left eye every 12 (twelve) hours.    Marland Kitchen estradiol (ESTRACE) 0.1 MG/GM vaginal cream Place [id number] Applicatorfuls vaginally 2 (two) times a week. 1/2 gram twice weekly (Patient taking differently: Place [id number] Applicatorfuls vaginally once a week. 1/2 gram twice weekly) 42.5 g 3  . GLUCOSAMINE CHONDROITIN COMPLX PO Take by mouth.    . Multiple Vitamin (MULTIVITAMIN) tablet Take 1 tablet by mouth daily.    . Omega-3 Fatty Acids (FISH OIL) 1200 MG CAPS Take 2 by mouth daily    . ranitidine  (ZANTAC) 300 MG tablet TAKE ONE TABLET BY MOUTH AT BEDTIME 90 tablet 1   No current facility-administered medications on file prior to visit.     Allergies  Allergen Reactions  . Xalatan [Latanoprost]     Caused eye to be red, swollen,itchy    Past Medical History:  Diagnosis Date  . Arthritis   . Atrophy of vagina   . Cystocele   . GERD (gastroesophageal reflux disease)   . Glaucoma   . Glaucoma   . Rectocele   . Uveitis    right- chronic    Past Surgical History:  Procedure Laterality Date  . CATARACT EXTRACTION, BILATERAL    . TUBAL LIGATION      Family History  Problem Relation Age of Onset  . Aortic stenosis Mother   . Heart disease Mother   . Osteoporosis Mother   . Cancer Father     prostate  . Heart disease Father   . Multiple sclerosis Daughter   . Breast cancer Maternal Aunt     Social History   Social History  . Marital status: Married    Spouse name: N/A  . Number of children: N/A  . Years of education: N/A   Occupational History  . Not on file.   Social History Main Topics  . Smoking status: Never Smoker  . Smokeless tobacco: Never Used  .  Alcohol use Yes     Comment: wine daily  . Drug use: No  . Sexual activity: Not Currently    Birth control/ protection: Surgical   Other Topics Concern  . Not on file   Social History Narrative   Recently moved to [facility] from [place].   She is primary caregiver for her husband who has Alzheimers.   Two children.      Retired Engineer, production.      Desires CPR.   Has living will- SCANNED INTO CHART   Would not want prolonged life support if futile.                     The PMH, PSH, Social History, Family History, Medications, and allergies have been reviewed in [facility], and have been updated if relevant.   Review of Systems  Constitutional: Negative.   Gastrointestinal: Negative.   Genitourinary: Positive for frequency and hematuria. Negative for decreased urine volume,  difficulty urinating, dyspareunia, dysuria, menstrual problem, pelvic pain, urgency, vaginal bleeding, vaginal discharge and vaginal pain.  Hematological: Negative.   Psychiatric/Behavioral: Negative.   All other systems reviewed and are negative.      Objective:    BP 120/80   Pulse 97   Temp 97.8 F (36.6 C)   Wt 125 lb (56.7 kg)   SpO2 99%   BMI 20.80 kg/m    Physical Exam  Constitutional: She is oriented to person, place, and time. She appears well-developed and well-nourished. No distress.  HENT:  Head: Normocephalic.  Eyes: Conjunctivae are normal.  Cardiovascular: Regular rhythm.   Pulmonary/Chest: Effort normal.  Abdominal: Soft. Bowel sounds are normal. She exhibits no distension. There is no tenderness. There is no rebound and no guarding.  Neurological: She is alert and oriented to person, place, and time. No cranial nerve deficit.  Skin: Skin is warm and dry. She is not diaphoretic.  Psychiatric: She has a normal mood and affect. Her behavior is normal. Judgment and thought content normal.  Nursing note and vitals reviewed.         Assessment & Plan:   Acute cystitis without hematuria No Follow-up on file.

## 2016-10-12 NOTE — Addendum Note (Signed)
Addended by: Inocencio Homes on: 10/12/2016 10:15 AM   Modules accepted: Orders

## 2016-10-12 NOTE — Assessment & Plan Note (Signed)
Resolved s/p treatment with keflex. Urine cx confirms sensitivity. No further work up needed. Follow up as needed. The patient indicates understanding of these issues and agrees with the plan.

## 2016-10-12 NOTE — Patient Instructions (Signed)
Great to see you. Happy birthday. Please say hello to Mr. Garciagarcia for me.

## 2016-10-18 ENCOUNTER — Telehealth: Payer: Self-pay | Admitting: Obstetrics and Gynecology

## 2016-10-18 ENCOUNTER — Ambulatory Visit (INDEPENDENT_AMBULATORY_CARE_PROVIDER_SITE_OTHER): Payer: Medicare Other | Admitting: Obstetrics and Gynecology

## 2016-10-18 ENCOUNTER — Encounter: Payer: Self-pay | Admitting: Obstetrics and Gynecology

## 2016-10-18 VITALS — BP 136/80 | HR 76 | Temp 97.7°F | Ht 65.0 in | Wt 124.4 lb

## 2016-10-18 DIAGNOSIS — N814 Uterovaginal prolapse, unspecified: Secondary | ICD-10-CM

## 2016-10-18 DIAGNOSIS — Z4689 Encounter for fitting and adjustment of other specified devices: Secondary | ICD-10-CM | POA: Diagnosis not present

## 2016-10-18 DIAGNOSIS — R300 Dysuria: Secondary | ICD-10-CM

## 2016-10-18 DIAGNOSIS — N816 Rectocele: Secondary | ICD-10-CM

## 2016-10-18 LAB — POCT URINALYSIS DIPSTICK
BILIRUBIN UA: NEGATIVE
GLUCOSE UA: NEGATIVE
Ketones, UA: NEGATIVE
NITRITE UA: NEGATIVE
Protein, UA: NEGATIVE
UROBILINOGEN UA: NEGATIVE
pH, UA: 6.5

## 2016-10-18 MED ORDER — NITROFURANTOIN MONOHYD MACRO 100 MG PO CAPS: 100.0000 mg | ORAL_CAPSULE | Freq: Two times a day (BID) | ORAL | 0 refills | Status: DC

## 2016-10-18 NOTE — Telephone Encounter (Signed)
Pt called and she is having a UTI again, , she does have hematuria, she finished the antibiotic last week and her doctor tested her urine and it was all clear, but now she is having the hematuria and pain and she thinks it could be coming from her pessary, she does have an appt tomorrow bur she wasn't sure if she needs to be seen today.

## 2016-10-18 NOTE — Patient Instructions (Signed)
1. Pessary maintenance is completed today 2. Continue using Estrace cream intravaginal once a week 3. Continue using Trimosan gel for vaginal once a week 4. Continue with increase fluid hydration and cranberry juice intake 5. Urine culture is sent today to assess for bladder infection 6. Take Macrobid twice a day for 7 days 7. Return in 12 weeks for pessary maintenance   Urinary Tract Infection, Adult A urinary tract infection (UTI) is an infection of any part of the urinary tract, which includes the kidneys, ureters, bladder, and urethra. These organs make, store, and get rid of urine in the body. UTI can be a bladder infection (cystitis) or kidney infection (pyelonephritis). What are the causes? This infection may be caused by fungi, viruses, or bacteria. Bacteria are the most common cause of UTIs. This condition can also be caused by repeated incomplete emptying of the bladder during urination. What increases the risk? This condition is more likely to develop if:  You ignore your need to urinate or hold urine for long periods of time.  You do not empty your bladder completely during urination.  You wipe back to front after urinating or having a bowel movement, if you are female.  You are uncircumcised, if you are female.  You are constipated.  You have a urinary catheter that stays in place (indwelling).  You have a weak defense (immune) system.  You have a medical condition that affects your bowels, kidneys, or bladder.  You have diabetes.  You take antibiotic medicines frequently or for long periods of time, and the antibiotics no longer work well against certain types of infections (antibiotic resistance).  You take medicines that irritate your urinary tract.  You are exposed to chemicals that irritate your urinary tract.  You are female. What are the signs or symptoms? Symptoms of this condition include:  Fever.  Frequent urination or passing small amounts of urine  frequently.  Needing to urinate urgently.  Pain or burning with urination.  Urine that smells bad or unusual.  Cloudy urine.  Pain in the lower abdomen or back.  Trouble urinating.  Blood in the urine.  Vomiting or being less hungry than normal.  Diarrhea or abdominal pain.  Vaginal discharge, if you are female. How is this diagnosed? This condition is diagnosed with a medical history and physical exam. You will also need to provide a urine sample to test your urine. Other tests may be done, including:  Blood tests.  Sexually transmitted disease (STD) testing. If you have had more than one UTI, a cystoscopy or imaging studies may be done to determine the cause of the infections. How is this treated? Treatment for this condition often includes a combination of two or more of the following:  Antibiotic medicine.  Other medicines to treat less common causes of UTI.  Over-the-counter medicines to treat pain.  Drinking enough water to stay hydrated. Follow these instructions at home:  Take over-the-counter and prescription medicines only as told by your health care provider.  If you were prescribed an antibiotic, take it as told by your health care provider. Do not stop taking the antibiotic even if you start to feel better.  Avoid alcohol, caffeine, tea, and carbonated beverages. They can irritate your bladder.  Drink enough fluid to keep your urine clear or pale yellow.  Keep all follow-up visits as told by your health care provider. This is important.  Make sure to:  Empty your bladder often and completely. Do not hold urine for  long periods of time.  Empty your bladder before and after sex.  Wipe from front to back after a bowel movement if you are female. Use each tissue one time when you wipe. Contact a health care provider if:  You have back pain.  You have a fever.  You feel nauseous or vomit.  Your symptoms do not get better after 3 days.  Your  symptoms go away and then return. Get help right away if:  You have severe back pain or lower abdominal pain.  You are vomiting and cannot keep down any medicines or water. This information is not intended to replace advice given to you by your health care provider. Make sure you discuss any questions you have with your health care provider. Document Released: 05/18/2005 Document Revised: 01/20/2016 Document Reviewed: 06/29/2015 Elsevier Interactive Patient Education  2017 [employer].

## 2016-10-18 NOTE — Progress Notes (Signed)
Chief complaint: 1. Pessary maintenance (last visit 07/19/2016) 2. Urinary tract infection  Patient presents for 12 week gel horn pessary maintenance for management of uterine procidentia, cystocele, third-degree with Valsalva and rectocele. The patient is splinting less with the pessary in place during bowel movements.  Pessary maintenance includes: 70 mm gel horn short stem pessary Trimosan gel intravaginal once a week. Estrace cream intravaginal once a week.  Of significance is the fact that the patient has had a bladder infection 2 weeks ago which came on suddenly; she went to the emergency room and was treated with Keflex. Urine culture positive for Escherichia coli greater than 100,000, sensitive to Keflex Over the past 24 hours she again developed UTI symptoms; has taken increase fluids with reduction in symptomatology. Urinalysis today is notable for blood. Urine culture is sent. No vaginal bleeding. No vaginal discharge. No vaginal odor.  Past medical history, past surgical history, problem list, medications, and allergies are reviewed  OBJECTIVE: BP 136/80   Pulse 76   Temp 97.7 F (36.5 C)   Ht 5\' 5"  (1.651 m)   Wt 124 lb 6.4 oz (56.4 kg)   BMI 20.70 kg/m  PELVIC: External Genitalia: Normal BUS: Normal Vagina: Normal estrogen effect; third-degree cystourethrocele with Valsalva; moderate rectocele; no significant secretions; no odor Cervix: Normal; prolapses to the introitus with Valsalva; 7 mm cervical polyp at 7:00 Uterus: Normal size, shape,consistency, mobile Adnexa: Normal RV: Normal external exam Bladder: Nontender  PROCEDURE: The 70 mm gel horn short stem pessary is removed, cleaned, and reinserted  ASSESSMENT: 1. Recent UTI, treated 2. Recurrent UTI symptoms 3. Pessary maintenance, 12 weeks, normal  PLAN: 1. Urinalysis and urine culture 2. Return in 12  weeks for pessary maintenance 3. Continue Trimosan gel intravaginal once a week 4. Continue Estrace cream intravaginal once a week 5. Recommend increased hydration with water and cranberry juice  A total of 15 minutes were spent face-to-face with the patient during this encounter and over half of that time dealt with counseling and coordination of care.  Brayton Mars, MD  Note: This dictation was prepared with Dragon dictation along with smaller phrase technology. Any transcriptional errors that result from this process are unintentional.

## 2016-10-18 NOTE — Telephone Encounter (Signed)
Pt treated for uti in er 2 weeks ago. Seen by pcp 2/21 urine all clear. atb completed. Last nite having sx of pain, frequency, and blood in urine. Advised pt to come today for possible uti and pessary check. appt made for 10:00.

## 2016-10-19 ENCOUNTER — Encounter: Payer: Medicare Other | Admitting: Obstetrics and Gynecology

## 2016-10-20 LAB — URINE CULTURE

## 2016-11-23 ENCOUNTER — Telehealth: Payer: Self-pay | Admitting: *Deleted

## 2016-11-23 NOTE — Telephone Encounter (Signed)
Information has been submitted to pts insurance for verification of benefits. Awaiting response for coverage  

## 2016-11-23 NOTE — Telephone Encounter (Signed)
Left VM that Information has been submitted to pts insurance for verification of benefits.  Awaiting response for coverage

## 2016-12-05 DIAGNOSIS — H401122 Primary open-angle glaucoma, left eye, moderate stage: Secondary | ICD-10-CM | POA: Diagnosis not present

## 2016-12-13 DIAGNOSIS — H401122 Primary open-angle glaucoma, left eye, moderate stage: Secondary | ICD-10-CM | POA: Diagnosis not present

## 2017-01-17 ENCOUNTER — Ambulatory Visit (INDEPENDENT_AMBULATORY_CARE_PROVIDER_SITE_OTHER): Payer: Medicare Other | Admitting: Obstetrics and Gynecology

## 2017-01-17 ENCOUNTER — Encounter: Payer: Self-pay | Admitting: Obstetrics and Gynecology

## 2017-01-17 VITALS — BP 139/80 | HR 80 | Ht 65.0 in | Wt 122.9 lb

## 2017-01-17 DIAGNOSIS — R3915 Urgency of urination: Secondary | ICD-10-CM | POA: Diagnosis not present

## 2017-01-17 DIAGNOSIS — N816 Rectocele: Secondary | ICD-10-CM | POA: Diagnosis not present

## 2017-01-17 DIAGNOSIS — N814 Uterovaginal prolapse, unspecified: Secondary | ICD-10-CM | POA: Diagnosis not present

## 2017-01-17 DIAGNOSIS — N72 Inflammatory disease of cervix uteri: Secondary | ICD-10-CM | POA: Diagnosis not present

## 2017-01-17 DIAGNOSIS — Z4689 Encounter for fitting and adjustment of other specified devices: Secondary | ICD-10-CM | POA: Insufficient documentation

## 2017-01-17 LAB — POCT URINALYSIS DIPSTICK
Bilirubin, UA: NEGATIVE
Blood, UA: NEGATIVE
Glucose, UA: NEGATIVE
KETONES UA: NEGATIVE
LEUKOCYTES UA: NEGATIVE
Nitrite, UA: NEGATIVE
PH UA: 7.5 (ref 5.0–8.0)
Protein, UA: NEGATIVE
Spec Grav, UA: <=1.005 — AB (ref 1.010–1.025)
Urobilinogen, UA: NEGATIVE E.U./dL — AB

## 2017-01-17 NOTE — Patient Instructions (Addendum)
1. Slight cervical inflammation is noted; will recommend follow-up in 8 weeks instead of 12 to be sure no further problem is evolving 2. Continue with Premarin cream intravaginally once a week 3. Continue with Trimosan gel intravaginally once a week 4. Return in 8 weeks. 5. Urine culture is sent to rule out UTI

## 2017-01-17 NOTE — Progress Notes (Signed)
Chief complaint: 1. Pessary maintenance 2. Uterine procidentia 3. Cystocele, third-degree with Valsalva 4. Rectocele  12 week pessary check Pessary maintenance includes: 70 mm gel horn short stem pessary Trimosan gel intravaginal once a week. Estrace cream intravaginal once a week.  Patient notes some mild increased urinary urgency without dysuria; nocturia 2 No vaginal bleeding, vaginal discharge, vaginal odor, or pelvic pain. Patient was concerned about possible UTI several weeks ago; has been increasing water intake, cranberry tablets intake, and yogurt intake.  Past medical history, past surgical history, problem list, medications, and allergies are reviewed.  OBJECTIVE: BP 139/80   Pulse 80   Ht 5\' 5"  (1.651 m)   Wt 122 lb 14.4 oz (55.7 kg)   BMI 20.45 kg/m  Pleasant female in no acute distress. Alert and oriented. PELVIC: External Genitalia: Normal BUS: Normal Vagina: Normal estrogen effect; third-degree cystourethrocele with Valsalva; moderate rectocele; no significant secretions; no odor Cervix: Normal; prolapses to the introitus with Valsalva; 7 mm cervical polyp at 7:00; hyperemia is noted of the cervix between 12:00 3:00 and 5:00 in a "C" distribution Uterus: Normal size, shape,consistency, mobile Adnexa: Normal RV: Normal external exam Bladder: Nontender  PROCEDURE: The 70 mm gel horn short stem pessary is removed, cleaned, and reinserted  ASSESSMENT: 1. Pessary maintenance 2. Uterine procidentia, asymptomatic with pessary use 3. Cystocele, third-degree with Valsalva, asymptomatic with pessary use 4. Rectocele, asymptomatic with pessary use 5. Cervicitis without erosion, possibly related to pessary  PLAN: 1. Urinalysis and urine culture 2. Return in 8 weeks for pessary maintenance 3. Continue Trimosan gel intravaginal once a week 4. Continue Estrace cream  intravaginal once a week 5. Recommend maintaining hydration with water and cranberry tablets 6. Watch for signs of worsening cervicitis and return if vaginal discharge, vaginal spotting, or odor developed.  A total of 15 minutes were spent face-to-face with the patient during this encounter and over half of that time dealt with counseling and coordination of care.  Brayton Mars, MD  Note: This dictation was prepared with Dragon dictation along with smaller phrase technology. Any transcriptional errors that result from this process are unintentional.

## 2017-01-19 LAB — URINE CULTURE

## 2017-01-26 NOTE — Telephone Encounter (Signed)
Verification of benefits have been processed and an approval has been received for pts prolia injection. Pts estimated cost are appx $0. This is only an estimate and cannot be confirmed until benefits are paid. Please advise pt and schedule if needed. If scheduled, once the injection is received, pls contact me back with the date it was received so that I am able to update prolia folder. thanks  

## 2017-01-26 NOTE — Telephone Encounter (Signed)
Lm on pts vm requesting a call back to schedule Ca lab

## 2017-01-30 ENCOUNTER — Other Ambulatory Visit (INDEPENDENT_AMBULATORY_CARE_PROVIDER_SITE_OTHER): Payer: Medicare Other

## 2017-01-30 DIAGNOSIS — M810 Age-related osteoporosis without current pathological fracture: Secondary | ICD-10-CM

## 2017-01-30 LAB — CALCIUM: Calcium: 9.4 mg/dL (ref 8.4–10.5)

## 2017-02-15 ENCOUNTER — Ambulatory Visit (INDEPENDENT_AMBULATORY_CARE_PROVIDER_SITE_OTHER): Payer: Medicare Other

## 2017-02-15 DIAGNOSIS — M810 Age-related osteoporosis without current pathological fracture: Secondary | ICD-10-CM

## 2017-02-15 MED ORDER — DENOSUMAB 60 MG/ML ~~LOC~~ SOLN
60.0000 mg | Freq: Once | SUBCUTANEOUS | Status: AC
Start: 2017-02-15 — End: 2017-02-15
  Administered 2017-02-15: 60 mg via SUBCUTANEOUS

## 2017-03-14 ENCOUNTER — Encounter: Payer: Self-pay | Admitting: Obstetrics and Gynecology

## 2017-03-14 ENCOUNTER — Ambulatory Visit (INDEPENDENT_AMBULATORY_CARE_PROVIDER_SITE_OTHER): Payer: Medicare Other | Admitting: Obstetrics and Gynecology

## 2017-03-14 VITALS — BP 134/81 | HR 74 | Ht 65.0 in | Wt 122.2 lb

## 2017-03-14 DIAGNOSIS — Z4689 Encounter for fitting and adjustment of other specified devices: Secondary | ICD-10-CM

## 2017-03-14 DIAGNOSIS — N814 Uterovaginal prolapse, unspecified: Secondary | ICD-10-CM

## 2017-03-14 DIAGNOSIS — N816 Rectocele: Secondary | ICD-10-CM | POA: Diagnosis not present

## 2017-03-14 NOTE — Progress Notes (Signed)
Chief complaint: 1. Pessary maintenance (last visit 01/17/2017) 2. Uterine procidentia 3. Cystocele, third-degree with Valsalva 4. Rectocele  8 week pessary check Pessary maintenance includes: 70 mm gel horn short stem pessary Trimosan gel intravaginal once a week. Estrace cream intravaginal once a week.  No vaginal bleeding, vaginal discharge, vaginal odor, or pelvic pain.   Past medical history, past surgical history, problem list, medications, and allergies are reviewed.  OBJECTIVE: BP 134/81   Pulse 74   Ht 5\' 5"  (1.651 m)   Wt 122 lb 3.2 oz (55.4 kg)   BMI 20.34 kg/m  Pleasant female in no acute distress. Alert and oriented. PELVIC: External Genitalia: Normal BUS: Normal Vagina: Normal estrogen effect; third-degree cystourethrocele with Valsalva; moderate rectocele; no significant secretions; no odor Cervix: Normal; prolapses to the introitus with Valsalva; 7 mm cervical polyp at 7:00; hyperemia is noted of the cervix between 12:00 3:00 and 5:00 in a "C" distribution. No significant change-stable Uterus: Normal size, shape,consistency, mobile Adnexa: Normal RV: Normal external exam Bladder: Nontender  PROCEDURE: The 70 mm gel horn short stem pessary is removed, cleaned, and reinserted  ASSESSMENT: 1. Pessary maintenance 2. Uterine procidentia, asymptomatic with pessary use 3. Cystocele, third-degree with Valsalva, asymptomatic with pessary use 4. Rectocele, asymptomatic with pessary use 5. Cervicitis without erosion, possibly related to pessary-stable  PLAN: 1. Return in 12 weeks for pessary maintenance 2. Continue Trimosan gel intravaginal weekly 3. Continue Estrace cream intravaginal weekly  A total of 15 minutes were spent face-to-face with the patient during this encounter and over half of that time dealt with counseling and coordination of care.  Brayton Mars, MD  Note: This dictation was prepared with Dragon dictation along with smaller phrase technology. Any transcriptional errors that result from this process are unintentional.

## 2017-03-14 NOTE — Patient Instructions (Signed)
1. Continue using Trimosan gel weekly intravaginal 2. Continue using Premarin cream weekly intravaginal 3. Return in 3 months for pessary maintenance

## 2017-06-14 ENCOUNTER — Ambulatory Visit (INDEPENDENT_AMBULATORY_CARE_PROVIDER_SITE_OTHER): Payer: Medicare Other | Admitting: Obstetrics and Gynecology

## 2017-06-14 ENCOUNTER — Encounter: Payer: Self-pay | Admitting: Obstetrics and Gynecology

## 2017-06-14 VITALS — BP 132/80 | HR 76 | Ht 65.0 in | Wt 120.8 lb

## 2017-06-14 DIAGNOSIS — Z4689 Encounter for fitting and adjustment of other specified devices: Secondary | ICD-10-CM

## 2017-06-14 DIAGNOSIS — N816 Rectocele: Secondary | ICD-10-CM

## 2017-06-14 DIAGNOSIS — N8111 Cystocele, midline: Secondary | ICD-10-CM | POA: Diagnosis not present

## 2017-06-14 DIAGNOSIS — N814 Uterovaginal prolapse, unspecified: Secondary | ICD-10-CM

## 2017-06-14 NOTE — Progress Notes (Signed)
Chief complaint: 1. Pessary maintenance (last visit 7 12/13/2016) 2. Uterine procidentia 3. Cystocele, third-degree with Valsalva 4. Rectocele  12 week pessary check Pessary maintenance includes: 70 mm gel horn short stem pessary Trimosan gel intravaginal once a week. Estrace cream intravaginal once a week.  No vaginal bleeding, vaginal discharge, vaginal odor, or pelvic pain.   Has been passed away from Alzheimer's complications on 91/01/9449. Patient is grieving appropriately and has good support systems in place. Leela is interested in proceeding with surgical repair for pelvic organ prolapse, possibly in the summer as her daughter is a Pharmacist, hospital and will be available to help support her postoperatively  Past medical history, past surgical history, problem list, medications, and allergies are reviewed.  OBJECTIVE: BP 132/80   Pulse 76   Ht 5\' 5"  (1.651 m)   Wt 120 lb 12.8 oz (54.8 kg)   BMI 20.10 kg/m  Pleasant female in no acute distress.  Alert and oriented.  Sad. Abdomen: Soft, nontender Pelvic exam: External genitalia-normal BUS-normal Vagina-normal estrogen effect; third-degree cystourethrocele; moderate rectocele; no significant secretions or odor. Cervix-prolapses to the introitus with Valsalva; 7 mm cervical polyp at 7:00 unchanged; hyperemia is still present between 12:00, 3:00, and 5:00 in a "C" distribution. Uterus-normal size shape and consistency Adnexa-nonpalpable nontender Rectovaginal-normal external exam Bladder-nontender  ASSESSMENT: 1. Pessary maintenance 2. Uterine procidentia, asymptomatic with pessary use 3. Cystocele, third-degree with Valsalva, asymptomatic with pessary use 4. Rectocele, asymptomatic with pessary use 5. Cervicitis without erosion, possibly related to pessary-stable 6.  Husband deceased 05/31/2017 secondary to Alzheimer's complications 7.  Patient desires probable surgical repair in summer 2019  PLAN: 1.  Return in 12 weeks for  pessary maintenance 2.  Continue Trimosan gel intravaginal weekly 3.  Continue Estrace cream intravaginally weekly  A total of 15 minutes were spent face-to-face with the patient during this encounter and over half of that time dealt with counseling and coordination of care.  Brayton Mars, MD  Note: This dictation was prepared with Dragon dictation along with smaller phrase technology. Any transcriptional errors that result from this process are unintentional.

## 2017-06-14 NOTE — Patient Instructions (Addendum)
1.  Continue using Estrace cream intravaginal once a week 2.  Continue using Trimosan gel intravaginal once a week 3.  Return in 3 months for pessary maintenance  4.  Consider surgical repair in summer of 2019

## 2017-06-15 ENCOUNTER — Ambulatory Visit (INDEPENDENT_AMBULATORY_CARE_PROVIDER_SITE_OTHER): Payer: Medicare Other

## 2017-06-15 DIAGNOSIS — Z23 Encounter for immunization: Secondary | ICD-10-CM | POA: Diagnosis not present

## 2017-06-15 DIAGNOSIS — H401122 Primary open-angle glaucoma, left eye, moderate stage: Secondary | ICD-10-CM | POA: Diagnosis not present

## 2017-06-29 ENCOUNTER — Other Ambulatory Visit: Payer: Self-pay | Admitting: Obstetrics and Gynecology

## 2017-06-29 ENCOUNTER — Telehealth: Payer: Self-pay | Admitting: Obstetrics and Gynecology

## 2017-06-29 NOTE — Telephone Encounter (Signed)
Pt aware med erx. 

## 2017-06-29 NOTE — Telephone Encounter (Signed)
Patient called requesting a refill on estrace cream. She uses the [facility] on [address]. Thanks

## 2017-07-26 ENCOUNTER — Encounter: Payer: Self-pay | Admitting: Primary Care

## 2017-07-26 ENCOUNTER — Ambulatory Visit (INDEPENDENT_AMBULATORY_CARE_PROVIDER_SITE_OTHER): Payer: Medicare Other | Admitting: Primary Care

## 2017-07-26 VITALS — BP 124/84 | HR 83 | Temp 98.5°F | Ht 65.0 in | Wt 121.8 lb

## 2017-07-26 DIAGNOSIS — E2839 Other primary ovarian failure: Secondary | ICD-10-CM

## 2017-07-26 DIAGNOSIS — K219 Gastro-esophageal reflux disease without esophagitis: Secondary | ICD-10-CM | POA: Diagnosis not present

## 2017-07-26 DIAGNOSIS — M810 Age-related osteoporosis without current pathological fracture: Secondary | ICD-10-CM | POA: Diagnosis not present

## 2017-07-26 DIAGNOSIS — Z23 Encounter for immunization: Secondary | ICD-10-CM

## 2017-07-26 DIAGNOSIS — H209 Unspecified iridocyclitis: Secondary | ICD-10-CM | POA: Diagnosis not present

## 2017-07-26 DIAGNOSIS — Z4689 Encounter for fitting and adjustment of other specified devices: Secondary | ICD-10-CM

## 2017-07-26 DIAGNOSIS — S61419A Laceration without foreign body of unspecified hand, initial encounter: Secondary | ICD-10-CM | POA: Diagnosis not present

## 2017-07-26 DIAGNOSIS — H4089 Other specified glaucoma: Secondary | ICD-10-CM | POA: Diagnosis not present

## 2017-07-26 LAB — BASIC METABOLIC PANEL
BUN: 16 mg/dL (ref 6–23)
CHLORIDE: 101 meq/L (ref 96–112)
CO2: 32 meq/L (ref 19–32)
CREATININE: 0.69 mg/dL (ref 0.40–1.20)
Calcium: 9.8 mg/dL (ref 8.4–10.5)
GFR: 88.72 mL/min (ref >60.00)
GLUCOSE: 99 mg/dL (ref 70–99)
Potassium: 4 mEq/L (ref 3.5–5.1)
Sodium: 140 mEq/L (ref 135–145)

## 2017-07-26 MED ORDER — ZOSTER VAC RECOMB ADJUVANTED 50 MCG/0.5ML IM SUSR: 0.5000 mL | Freq: Once | INTRAMUSCULAR | 1 refills | Status: AC

## 2017-07-26 NOTE — Progress Notes (Signed)
Subjective:    Patient ID: Christy [name], female    DOB: 25-Jan-1946, 71 y.o.   MRN: [id number]  HPI  Christy [name] is a 71 year old female who presents today to transfer care from Dr. Deborra Medina. She would like a tetanus vaccination and prescription for the Shingrix vaccination.   1) Glaucoma: Located to left eye. Diagnosed with Uveitis in her 20's to both eyes. Currently managed on brimonidine and dorzolamide-timolol ophthalmic solutions. She is currently following with opthalmology.   2) Vaginal Atrophy: Also with history of cystocele and rectocele. Using estrace vaginal cream twice weekly for which she's used for the last several years. Currently following with GYN. She is currently using a pessary.   3) Osteopenia: Last bone density scan was in 2016 with T score of -1.8. Currently managed on Prolia since March 2015. She is due today for her next Prolia injection. She is managed on calcium with vitamin D and a separate vitamin D supplement.   4) GERD: Previously managed on Zantac, now taking Tums. She will take Tums mostly once weekly on average.   Review of Systems  Constitutional: Negative for fatigue.  Respiratory: Negative for shortness of breath.   Cardiovascular: Negative for chest pain.  Gastrointestinal:       GERD symptoms once weekly  Genitourinary:       Cystocele, rectocele   Neurological: Negative for dizziness and headaches.       Past Medical History:  Diagnosis Date  . Arthritis   . Atrophy of vagina   . Cystocele   . GERD (gastroesophageal reflux disease)   . Glaucoma   . Glaucoma   . Rectocele   . Uveitis    left     Social History   Socioeconomic History  . Marital status: Widowed    Spouse name: Not on file  . Number of children: Not on file  . Years of education: Not on file  . Highest education level: Not on file  Social Needs  . Financial resource strain: Not on file  . Food insecurity - worry: Not on file  . Food insecurity - inability: Not  on file  . Transportation needs - medical: Not on file  . Transportation needs - non-medical: Not on file  Occupational History  . Not on file  Tobacco Use  . Smoking status: Never Smoker  . Smokeless tobacco: Never Used  Substance and Sexual Activity  . Alcohol use: Yes    Comment: wine daily  . Drug use: No  . Sexual activity: Not Currently    Birth control/protection: Surgical  Other Topics Concern  . Not on file  Social History Narrative   Recently moved to [facility] from [place].   She is primary caregiver for her husband who has Alzheimers.   Two children.      Retired Engineer, production.      Desires CPR.   Has living will- SCANNED INTO CHART   Would not want prolonged life support if futile.                   Past Surgical History:  Procedure Laterality Date  . CATARACT EXTRACTION, BILATERAL    . TUBAL LIGATION      Family History  Problem Relation Age of Onset  . Aortic stenosis Mother   . Heart disease Mother   . Osteoporosis Mother   . Cancer Father        prostate  . Heart disease Father   .  Multiple sclerosis Daughter   . Breast cancer Maternal Aunt     Allergies  Allergen Reactions  . Xalatan [Latanoprost]     Caused eye to be red, swollen,itchy    Current Outpatient Medications on File Prior to Visit  Medication Sig Dispense Refill  . aspirin 81 MG tablet Take 81 mg by mouth daily.    . brimonidine (ALPHAGAN) 0.2 % ophthalmic solution Place 1 drop into the left eye 2 (two) times daily.     . calcium-vitamin D (OSCAL WITH D) 500-200 MG-UNIT tablet Take 1 tablet by mouth.    . cholecalciferol (VITAMIN D) 1000 UNITS tablet Take 1,000 Units by mouth daily.    . Cranberry 1000 MG CAPS Take by mouth.    . denosumab (PROLIA) 60 MG/ML SOLN injection Inject 60 mg into the skin every 6 (six) months. Administer in upper arm, thigh, or abdomen    . dorzolamide-timolol (COSOPT) 22.3-6.8 MG/ML ophthalmic solution Place 1 drop into the left eye  every 12 (twelve) hours.    Marland Kitchen estradiol (ESTRACE) 0.1 MG/GM vaginal cream INSERT ONE-HALF GRAM  VAGINALLY TWICE WEEKLY 42.5 g 3  . GLUCOSAMINE CHONDROITIN COMPLX PO Take by mouth.    . Multiple Vitamin (MULTIVITAMIN) tablet Take 1 tablet by mouth daily.    . Omega-3 Fatty Acids (FISH OIL) 1200 MG CAPS Take 2 by mouth daily     No current facility-administered medications on file prior to visit.     BP 124/84   Pulse 83   Temp 98.5 F (36.9 C) (Oral)   Ht 5\' 5"  (1.651 m)   Wt 121 lb 12.8 oz (55.2 kg)   SpO2 99%   BMI 20.27 kg/m    Objective:   Physical Exam  Constitutional: She appears well-nourished.  Neck: Neck supple.  Cardiovascular: Normal rate and regular rhythm.  Pulmonary/Chest: Effort normal and breath sounds normal.  Skin: Skin is warm and dry.  Psychiatric: She has a normal mood and affect.          Assessment & Plan:

## 2017-07-26 NOTE — Assessment & Plan Note (Signed)
Following with ophthalmology. Continue current regimen. 

## 2017-07-26 NOTE — Addendum Note (Signed)
Addended by: Jacqualin Combes on: 07/26/2017 04:34 PM   Modules accepted: Orders

## 2017-07-26 NOTE — Assessment & Plan Note (Addendum)
Seems to be improved on Prolia. Check calcium and vitamin D today. Due for repeat injection. Bone density scan pending.

## 2017-07-26 NOTE — Assessment & Plan Note (Signed)
Continue PRN Tums, using appropriate. Continue to monitor.

## 2017-07-26 NOTE — Assessment & Plan Note (Signed)
Following with GYN every 12 weeks.

## 2017-07-26 NOTE — Assessment & Plan Note (Signed)
Diagnosed in 20's, following with ophthalmology.

## 2017-07-26 NOTE — Patient Instructions (Signed)
Complete lab work prior to leaving today. I will notify you of your results once received.   Take the shingles vaccination to your pharmacy as discussed.  Please schedule a Medicare Wellness Visit with Katha Cabal, our nurse, anytime in 2019 at your convenience.  It was a pleasure meeting you!

## 2017-08-11 ENCOUNTER — Ambulatory Visit: Payer: Medicare Other | Admitting: Primary Care

## 2017-08-18 ENCOUNTER — Ambulatory Visit (INDEPENDENT_AMBULATORY_CARE_PROVIDER_SITE_OTHER): Payer: Medicare Other | Admitting: *Deleted

## 2017-08-18 DIAGNOSIS — M810 Age-related osteoporosis without current pathological fracture: Secondary | ICD-10-CM | POA: Diagnosis not present

## 2017-08-18 MED ORDER — DENOSUMAB 60 MG/ML ~~LOC~~ SOLN
60.0000 mg | Freq: Once | SUBCUTANEOUS | Status: AC
  Administered 2017-08-18: 60 mg via SUBCUTANEOUS

## 2017-09-06 ENCOUNTER — Ambulatory Visit
Admission: RE | Admit: 2017-09-06 | Discharge: 2017-09-06 | Disposition: A | Discharge status: Home / Self Care | Payer: Medicare Other | Source: Ambulatory Visit | Attending: Primary Care | Admitting: Primary Care

## 2017-09-06 DIAGNOSIS — H409 Unspecified glaucoma: Secondary | ICD-10-CM | POA: Insufficient documentation

## 2017-09-06 DIAGNOSIS — E2839 Other primary ovarian failure: Secondary | ICD-10-CM

## 2017-09-06 DIAGNOSIS — M8588 Other specified disorders of bone density and structure, other site: Secondary | ICD-10-CM | POA: Insufficient documentation

## 2017-09-06 DIAGNOSIS — Z780 Asymptomatic menopausal state: Secondary | ICD-10-CM | POA: Diagnosis not present

## 2017-09-06 DIAGNOSIS — M810 Age-related osteoporosis without current pathological fracture: Secondary | ICD-10-CM

## 2017-09-06 DIAGNOSIS — M85852 Other specified disorders of bone density and structure, left thigh: Secondary | ICD-10-CM | POA: Insufficient documentation

## 2017-09-06 DIAGNOSIS — M8589 Other specified disorders of bone density and structure, multiple sites: Secondary | ICD-10-CM | POA: Diagnosis not present

## 2017-09-13 ENCOUNTER — Ambulatory Visit (INDEPENDENT_AMBULATORY_CARE_PROVIDER_SITE_OTHER): Payer: Medicare Other | Admitting: Obstetrics and Gynecology

## 2017-09-13 ENCOUNTER — Encounter: Payer: Self-pay | Admitting: Obstetrics and Gynecology

## 2017-09-13 VITALS — BP 130/78 | HR 92 | Ht 65.0 in | Wt 121.7 lb

## 2017-09-13 DIAGNOSIS — Z4689 Encounter for fitting and adjustment of other specified devices: Secondary | ICD-10-CM

## 2017-09-13 DIAGNOSIS — N814 Uterovaginal prolapse, unspecified: Secondary | ICD-10-CM | POA: Diagnosis not present

## 2017-09-13 DIAGNOSIS — N816 Rectocele: Secondary | ICD-10-CM

## 2017-09-13 DIAGNOSIS — N8111 Cystocele, midline: Secondary | ICD-10-CM | POA: Diagnosis not present

## 2017-09-13 NOTE — Patient Instructions (Signed)
1.  Return in 12 weeks for pessary maintenance

## 2017-09-13 NOTE — Progress Notes (Signed)
Chief complaint: 1.  Pessary maintenance (last visit 06/14/2017) 2.  Uterine procidentia 3.  Cystocele, third-degree with Valsalva 4.  Rectocele  12-week pessary check. 70 mm gel horn short stem pessary Trimosan gel intravaginal weekly Estrace cream intravaginal weekly  Patient denies vaginal bleeding, vaginal discharge, vaginal odor, or pelvic pain.  Patient is doing well since the passing of her spouse on 05/28/2017.  She is coping well and has family which is helped her through the grieving process. Christy [name] would like to establish surgery for her pelvic organ prolapse in the first week of June 2019.  Her daughter will be available to help her perioperatively during that time.  (Daughter is currently a Pharmacist, hospital living in [place]).  Past medical history, past surgical history, problem list, medications, and allergies are reviewed  OBJECTIVE: BP 130/78   Pulse 92   Ht 5\' 5"  (1.651 m)   Wt 121 lb 11.2 oz (55.2 kg)   BMI 20.25 kg/m  Pleasant elderly female in no acute distress.  Alert and oriented.  Affect is normal. Abdomen: Soft, nontender without organomegaly Pelvic exam: External genitalia-normal BUS-normal Vagina-third-degree cystourethrocele no significant secretions or odor; there is mild hyperemia of the vagina along the left vaginal sidewall adjacent to the cervix without mucosal disruption.  (No significant interval change) Cervix-parous; 7 mm cervical polyp at 7:00 unchanged; no cervical motion tenderness Uterus-normal size and shape and consistency Adnexa-nonpalpable nontender Rectovaginal-normal external exam  ASSESSMENT: 1.  Normal pessary maintenance-70 mm short stem Gellhorn 2.  Uterine procidentia, asymptomatic with pessary use 3.  Cystocele, third-degree with Valsalva, asymptomatic with pessary use 4.  Rectocele, asymptomatic with pessary use 5.  Desiring surgical repair in June 2019  PLAN: 1.  Return in 12 weeks for pessary maintenance 2.   Continue using Trimosan gel intravaginal weekly 3.  Continue using Estrace cream intravaginal weekly 4.  Schedule TVH BSO with anterior/posterior colporrhaphy and June 2019.  A total of 15 minutes were spent face-to-face with the patient during this encounter and over half of that time dealt with counseling and coordination of care.  Brayton Mars, MD  Note: This dictation was prepared with Dragon dictation along with smaller phrase technology. Any transcriptional errors that result from this process are unintentional.

## 2017-10-03 ENCOUNTER — Telehealth: Payer: Self-pay | Admitting: Obstetrics and Gynecology

## 2017-10-03 MED ORDER — OXYQUINOLONE SULFATE 0.025 % VA GEL: 0.2500 "application " | VAGINAL | 3 refills | Status: DC

## 2017-10-03 NOTE — Telephone Encounter (Signed)
The patient called and stated that she would like for Joyice Faster to give her a call in regards to her medication being sent to [facility] on [address], And the medication being covered. No other information was disclosed. Please advise.

## 2017-10-03 NOTE — Telephone Encounter (Signed)
Pt requested a refill of trimosan sent to [facility]. Med erx.

## 2017-12-12 ENCOUNTER — Encounter: Payer: Self-pay | Admitting: Obstetrics and Gynecology

## 2017-12-12 ENCOUNTER — Ambulatory Visit (INDEPENDENT_AMBULATORY_CARE_PROVIDER_SITE_OTHER): Payer: Medicare Other | Admitting: Obstetrics and Gynecology

## 2017-12-12 VITALS — BP 130/80 | HR 81 | Ht 65.0 in | Wt 124.1 lb

## 2017-12-12 DIAGNOSIS — Z4689 Encounter for fitting and adjustment of other specified devices: Secondary | ICD-10-CM | POA: Diagnosis not present

## 2017-12-12 DIAGNOSIS — N814 Uterovaginal prolapse, unspecified: Secondary | ICD-10-CM

## 2017-12-12 DIAGNOSIS — N816 Rectocele: Secondary | ICD-10-CM

## 2017-12-12 DIAGNOSIS — N8111 Cystocele, midline: Secondary | ICD-10-CM

## 2017-12-12 DIAGNOSIS — N765 Ulceration of vagina: Secondary | ICD-10-CM

## 2017-12-12 NOTE — Progress Notes (Signed)
Chief complaint: 1.  Pessary maintenance (last visit 09/13/2017) 2.  Uterine procidentia 3.  Cystocele, third-degree with Valsalva 4.  Rectocele  12-week pessary check. 70 mm gel horn short stem pessary currently functioning well. Trimosan gel intravaginal weekly Estrace cream intravaginal weekly  Patient reports no vaginal bleeding, vaginal discharge, vaginal odor, or pelvic pain.  Patient is contemplating definitive surgical management for uterine procidentia in March 12, 2018. Since the death of her spouse in 2017/06/20, she has been doing well through the grieving process.  Past medical history, past surgical history, problem list, medications, and allergies are reviewed  OBJECTIVE: BP 130/80   Pulse 81   Ht 5\' 5"  (1.651 m)   Wt 124 lb 1.6 oz (56.3 kg)   BMI 20.65 kg/m  Pleasant well-appearing female in no acute distress.  Alert and oriented. Affect is appropriate. Abdomen: Soft, nontender Pelvic exam: Surgeon today-normal BUS-normal Vagina-third-degree cystourethrocele was present; no significant secretions; slight mal odor; mild hyperemia of the vagina is noted along the left vaginal sidewall with shallow ulceration Cervix-parous; 7 mm cervical polyp at 7:00 is unchanged; no cervical motion tenderness Uterus-not examined Adnexa-not examined Rectovaginal-normal external exam  PROCEDURE: The 70 mm short stem gel horn pessary is removed, cleaned, and not reinserted.  ASSESSMENT: 1.  Pessary maintenance-70 mm short stem Gellhorn; vaginal ulceration noted 2.  Uterine procidentia, asymptomatic with pessary use 3.  Cystocele, third-degree with Valsalva, asymptomatic with pessary use 4.  Rectocele, asymptomatic with pessary use 5.  Desires surgical repair 12-Mar-2018  PLAN: 1.  Surgery as scheduled 2.  Return in 3 weeks for follow-up 3.  Discontinue using Trimosan gel intravaginal weekly 4.  Continue using Estrace cream intravaginal (increase) to twice weekly  A total of 15  minutes were spent face-to-face with the patient during this encounter and over half of that time dealt with counseling and coordination of care.  Brayton Mars, MD  Note: This dictation was prepared with Dragon dictation along with smaller phrase technology. Any transcriptional errors that result from this process are unintentional.

## 2017-12-12 NOTE — Patient Instructions (Signed)
1.  Pessary is removed and not reinserted today 2.  Stop Trimosan gel intravaginal 3.  Increase Estrace cream intravaginal twice a week 4.  Return in 3 weeks for follow-up

## 2017-12-14 DIAGNOSIS — H401122 Primary open-angle glaucoma, left eye, moderate stage: Secondary | ICD-10-CM | POA: Diagnosis not present

## 2017-12-21 DIAGNOSIS — H401122 Primary open-angle glaucoma, left eye, moderate stage: Secondary | ICD-10-CM | POA: Diagnosis not present

## 2017-12-29 ENCOUNTER — Telehealth: Payer: Self-pay | Admitting: *Deleted

## 2017-12-29 NOTE — Telephone Encounter (Signed)
Information has been submitted to pts insurance for verification of benefits. Awaiting response for coverage  

## 2018-01-02 ENCOUNTER — Encounter: Payer: Medicare Other | Admitting: Obstetrics and Gynecology

## 2018-01-04 ENCOUNTER — Ambulatory Visit (INDEPENDENT_AMBULATORY_CARE_PROVIDER_SITE_OTHER): Payer: Medicare Other | Admitting: Obstetrics and Gynecology

## 2018-01-04 ENCOUNTER — Encounter: Payer: Self-pay | Admitting: Obstetrics and Gynecology

## 2018-01-04 VITALS — BP 139/77 | HR 86 | Ht 65.0 in | Wt 123.6 lb

## 2018-01-04 DIAGNOSIS — N816 Rectocele: Secondary | ICD-10-CM | POA: Diagnosis not present

## 2018-01-04 DIAGNOSIS — N8111 Cystocele, midline: Secondary | ICD-10-CM

## 2018-01-04 DIAGNOSIS — N814 Uterovaginal prolapse, unspecified: Secondary | ICD-10-CM

## 2018-01-04 DIAGNOSIS — N765 Ulceration of vagina: Secondary | ICD-10-CM | POA: Diagnosis not present

## 2018-01-04 NOTE — Patient Instructions (Signed)
1.  Return on June 4 for preop appointment 2.  Continue using Estrace cream intravaginal twice a week 3.  Pessary will be kept out at this time until surgery

## 2018-01-04 NOTE — Progress Notes (Signed)
Chief complaint: 1.  Vaginal ulceration 2.  Uterine procidentia with midline cystocele and rectocele  The patient presents today for 3-week follow-up after pessary removal.  She is preparing for surgery and has been using Estrace cream intravaginal.  At last visit she was noted to have a left vaginal sidewall ulcer.  She reports no significant vaginal discharge or vaginal bleeding.  She can function reasonably well without the pessary in place at this time.  Past medical history, past surgical history, problem list, medications, and allergies are reviewed  OBJECTIVE: BP 139/77   Pulse 86   Ht 5\' 5"  (1.651 m)   Wt 123 lb 9.6 oz (56.1 kg)   BMI 20.57 kg/m  Pleasant well-appearing female in no acute distress.  Alert and oriented. Abdomen: Soft, nontender Pelvic exam: External genitalia-normal BUS-normal Vagina-third-degree cystourethrocele was present; no significant secretions; mal odor previously noted is resolved; mild hyperemia previously noted along the left vaginal sidewall is resolved; previously noted shallow ulceration is re-epithelializing and nonfriable Cervix-parous; 7 mm cervical polyp at 7:00 is unchanged; no cervical motion tenderness Uterus-small mobile, nontender Adnexa-nonpalpable nontender Rectovaginal-normal external exam  ASSESSMENT: 1.  Resolving vaginal ulcer from pessary 2.  Uterine procidentia and traction cystocele and rectocele, previously treated with pessary 3.  Surgical preparation ongoing  PLAN: 1.  Pessary will be left out now until surgery. 2.  Patient is to continue using Estrace cream intravaginal twice weekly 3.  Return for preop appointment as scheduled 4.  TVH BSO with anterior and posterior colporrhaphy as scheduled for the end of June.  A total of 15 minutes were spent face-to-face with the patient during this encounter and over half of that time dealt with counseling and coordination of care.  Brayton Mars, MD  Note: This  dictation was prepared with Dragon dictation along with smaller phrase technology. Any transcriptional errors that result from this process are unintentional.

## 2018-01-23 ENCOUNTER — Ambulatory Visit (INDEPENDENT_AMBULATORY_CARE_PROVIDER_SITE_OTHER): Payer: Medicare Other | Admitting: Obstetrics and Gynecology

## 2018-01-23 ENCOUNTER — Encounter: Payer: Self-pay | Admitting: Obstetrics and Gynecology

## 2018-01-23 VITALS — BP 107/61 | HR 79 | Ht 65.0 in | Wt 122.7 lb

## 2018-01-23 DIAGNOSIS — N814 Uterovaginal prolapse, unspecified: Secondary | ICD-10-CM

## 2018-01-23 DIAGNOSIS — Z01818 Encounter for other preprocedural examination: Secondary | ICD-10-CM

## 2018-01-23 DIAGNOSIS — N816 Rectocele: Secondary | ICD-10-CM

## 2018-01-23 DIAGNOSIS — N8111 Cystocele, midline: Secondary | ICD-10-CM

## 2018-01-23 NOTE — H&P (Signed)
PREOPERATIVE HISTORY AND PHYSICAL  Date of surgery: 02/12/2018 Procedure: 1.  TVH BSO 2.  Anterior/posterior colporrhaphy's Diagnosis: Symptomatic procidentia with cystocele (3rd degree) and rectocele   Patient is a 72 y.o. [id number] scheduled for surgery on 02/12/2018.  Previously she had been using pessary for medical management of pelvic organ prolapse.  She now desires definitive surgery.   Pertinent Gynecological History: History of uterine procidentia, third-degree cystocele, and moderate rectocele Previously managed with gel horn pessary 17 mm short stem History of vaginal ulceration from pessary in the past, now resolved No history of abnormal Pap smears Not sexually active  OB History    Gravida  2   Para  2   Term  2   Preterm      AB      Living  2     SAB      TAB      Ectopic      Multiple      Live Births  2          No LMP recorded. Patient is postmenopausal.    Past Medical History:  Diagnosis Date  . Arthritis   . Atrophy of vagina   . Cystocele   . GERD (gastroesophageal reflux disease)   . Glaucoma   . Glaucoma   . Rectocele   . Uveitis    left    Past Surgical History:  Procedure Laterality Date  . CATARACT EXTRACTION, BILATERAL    . TUBAL LIGATION      OB History  Gravida Para Term Preterm AB Living  2 2 2     2   SAB TAB Ectopic Multiple Live Births          2    # Outcome Date GA Lbr Len/2nd Weight Sex Delivery Anes PTL Lv  2 Term 1978   8 lb (3.629 kg) M Vag-Spont   LIV  1 Term 1975   7 lb 8 oz (3.402 kg) F Vag-Spont   LIV    Social History   Socioeconomic History  . Marital status: Widowed    Spouse name: Not on file  . Number of children: Not on file  . Years of education: Not on file  . Highest education level: Not on file  Occupational History  . Not on file  Social Needs  . Financial resource strain: Not on file  . Food insecurity:    Worry: Not on file    Inability: Not on file  .  Transportation needs:    Medical: Not on file    Non-medical: Not on file  Tobacco Use  . Smoking status: Never Smoker  . Smokeless tobacco: Never Used  Substance and Sexual Activity  . Alcohol use: Yes    Comment: wine daily  . Drug use: No  . Sexual activity: Not Currently    Birth control/protection: Surgical  Lifestyle  . Physical activity:    Days per week: Not on file    Minutes per session: Not on file  . Stress: Not on file  Relationships  . Social connections:    Talks on phone: Not on file    Gets together: Not on file    Attends religious service: Not on file    Active member of club or organization: Not on file    Attends meetings of clubs or organizations: Not on file    Relationship status: Not on file  Other Topics Concern  . Not on file  Social History  Narrative   Recently moved to [facility] from [place].   She is primary caregiver for her husband who has Alzheimers.   Two children.      Retired Engineer, production.      Desires CPR.   Has living will- SCANNED INTO CHART   Would not want prolonged life support if futile.                   Family History  Problem Relation Age of Onset  . Aortic stenosis Mother   . Heart disease Mother   . Osteoporosis Mother   . Cancer Father        prostate  . Heart disease Father   . Multiple sclerosis Daughter   . Breast cancer Maternal Aunt      (Not in a hospital admission)  Allergies  Allergen Reactions  . Xalatan [Latanoprost]     Caused eye to be red, swollen,itchy    Review of Systems Constitutional: No recent fever/chills/sweats Respiratory: No recent cough/bronchitis Cardiovascular: No chest pain Gastrointestinal: No recent nausea/vomiting/diarrhea Genitourinary: No UTI symptoms Hematologic/lymphatic:No history of coagulopathy or recent blood thinner use    Objective:    BP 107/61   Pulse 79   Ht 5\' 5"  (1.651 m)   Wt 122 lb 11.2 oz (55.7 kg)   BMI 20.42 kg/m   General:    Normal  Skin:   normal  HEENT:  Normal  Neck:  Supple without Adenopathy or Thyromegaly  Lungs:   Heart:              Breasts:   Abdomen:  Pelvis:  M/S   Extremeties:  Neuro:    clear to auscultation bilaterally   Normal without murmur   Not Examined   soft, non-tender; bowel sounds normal; no masses,  no organomegaly   Exam deferred to OR  No CVAT  Warm/Dry   Normal        01/04/2018 Pelvic exam: External genitalia-normal BUS-normal Vagina-third-degree cystourethrocele was present; no significant secretions; malodor previously noted is resolved; mild hyperemia previously noted along the left vaginal sidewall is resolved; previously noted shallow ulceration is re-epithelializing and nonfriable Cervix-parous; 7 mm cervical polyp at 7:00 is unchanged; no cervical motion tenderness Uterus-small mobile, nontender Adnexa-nonpalpable nontender Rectovaginal-normal external exam    Assessment:    Uterine procidentia Third-degree cystocele Moderate rectocele   Plan:  TVH BSO with anterior/posterior colporrhaphy  Preop counseling: The patient is undergo TVH BSO with anterior/posterior colporrhaphy for management of symptomatic pelvic relaxation.  She is understanding of the planned procedures and is aware of and is accepting of all surgical risks which include but are not limited to bleeding, infection, pelvic organ injury with need for repair, blood clot disorders, anesthesia risk, etc.  All questions have been answered.  Informed consent is given.  Patient is ready willing to proceed with surgery as scheduled.  Brayton Mars, MD  Note: This dictation was prepared with Dragon dictation along with smaller phrase technology. Any transcriptional errors that result from this process are unintentional.

## 2018-01-23 NOTE — Progress Notes (Signed)
PREOPERATIVE HISTORY AND PHYSICAL  Date of surgery: 02/12/2018 Procedure: 1.  TVH BSO 2.  Anterior/posterior colporrhaphy's Diagnosis: Symptomatic procidentia with cystocele (3rd degree) and rectocele   Patient is a 72 y.o. G2P2064female scheduled for surgery on 02/12/2018.  Previously she had been using pessary for medical management of pelvic organ prolapse.  She now desires definitive surgery.   Pertinent Gynecological History: History of uterine procidentia, third-degree cystocele, and moderate rectocele Previously managed with gel horn pessary 17 mm short stem History of vaginal ulceration from pessary in the past, now resolved No history of abnormal Pap smears Not sexually active  OB History    Gravida  2   Para  2   Term  2   Preterm      AB      Living  2     SAB      TAB      Ectopic      Multiple      Live Births  2          No LMP recorded. Patient is postmenopausal.    Past Medical History:  Diagnosis Date  . Arthritis   . Atrophy of vagina   . Cystocele   . GERD (gastroesophageal reflux disease)   . Glaucoma   . Glaucoma   . Rectocele   . Uveitis    left    Past Surgical History:  Procedure Laterality Date  . CATARACT EXTRACTION, BILATERAL    . TUBAL LIGATION      OB History  Gravida Para Term Preterm AB Living  2 2 2     2   SAB TAB Ectopic Multiple Live Births          2    # Outcome Date GA Lbr Len/2nd Weight Sex Delivery Anes PTL Lv  2 Term 1978   8 lb (3.629 kg) M Vag-Spont   LIV  1 Term 1975   7 lb 8 oz (3.402 kg) F Vag-Spont   LIV    Social History   Socioeconomic History  . Marital status: Widowed    Spouse name: Not on file  . Number of children: Not on file  . Years of education: Not on file  . Highest education level: Not on file  Occupational History  . Not on file  Social Needs  . Financial resource strain: Not on file  . Food insecurity:    Worry: Not on file    Inability: Not on file  .  Transportation needs:    Medical: Not on file    Non-medical: Not on file  Tobacco Use  . Smoking status: Never Smoker  . Smokeless tobacco: Never Used  Substance and Sexual Activity  . Alcohol use: Yes    Comment: wine daily  . Drug use: No  . Sexual activity: Not Currently    Birth control/protection: Surgical  Lifestyle  . Physical activity:    Days per week: Not on file    Minutes per session: Not on file  . Stress: Not on file  Relationships  . Social connections:    Talks on phone: Not on file    Gets together: Not on file    Attends religious service: Not on file    Active member of club or organization: Not on file    Attends meetings of clubs or organizations: Not on file    Relationship status: Not on file  Other Topics Concern  . Not on file  Social History  Narrative   Recently moved to [facility] from [place].   She is primary caregiver for her husband who has Alzheimers.   Two children.      Retired Engineer, production.      Desires CPR.   Has living will- SCANNED INTO CHART   Would not want prolonged life support if futile.                   Family History  Problem Relation Age of Onset  . Aortic stenosis Mother   . Heart disease Mother   . Osteoporosis Mother   . Cancer Father        prostate  . Heart disease Father   . Multiple sclerosis Daughter   . Breast cancer Maternal Aunt      (Not in a hospital admission)  Allergies  Allergen Reactions  . Xalatan [Latanoprost]     Caused eye to be red, swollen,itchy    Review of Systems Constitutional: No recent fever/chills/sweats Respiratory: No recent cough/bronchitis Cardiovascular: No chest pain Gastrointestinal: No recent nausea/vomiting/diarrhea Genitourinary: No UTI symptoms Hematologic/lymphatic:No history of coagulopathy or recent blood thinner use    Objective:    BP 107/61   Pulse 79   Ht 5\' 5"  (1.651 m)   Wt 122 lb 11.2 oz (55.7 kg)   BMI 20.42 kg/m   General:    Normal  Skin:   normal  HEENT:  Normal  Neck:  Supple without Adenopathy or Thyromegaly  Lungs:   Heart:              Breasts:   Abdomen:  Pelvis:  M/S   Extremeties:  Neuro:    clear to auscultation bilaterally   Normal without murmur   Not Examined   soft, non-tender; bowel sounds normal; no masses,  no organomegaly   Exam deferred to OR  No CVAT  Warm/Dry   Normal        01/04/2018 Pelvic exam: External genitalia-normal BUS-normal Vagina-third-degree cystourethrocele was present; no significant secretions; malodor previously noted is resolved; mild hyperemia previously noted along the left vaginal sidewall is resolved; previously noted shallow ulceration is re-epithelializing and nonfriable Cervix-parous; 7 mm cervical polyp at 7:00 is unchanged; no cervical motion tenderness Uterus-small mobile, nontender Adnexa-nonpalpable nontender Rectovaginal-normal external exam    Assessment:    Uterine procidentia Third-degree cystocele Moderate rectocele   Plan:  TVH BSO with anterior/posterior colporrhaphy  Preop counseling: The patient is undergo TVH BSO with anterior/posterior colporrhaphy for management of symptomatic pelvic relaxation.  She is understanding of the planned procedures and is aware of and is accepting of all surgical risks which include but are not limited to bleeding, infection, pelvic organ injury with need for repair, blood clot disorders, anesthesia risk, etc.  All questions have been answered.  Informed consent is given.  Patient is ready willing to proceed with surgery as scheduled.  Brayton Mars, MD  Note: This dictation was prepared with Dragon dictation along with smaller phrase technology. Any transcriptional errors that result from this process are unintentional.

## 2018-01-23 NOTE — Patient Instructions (Signed)
1.  Return for postop check 1 week after surgery   Vaginal Hysterectomy, Care After Refer to this sheet in the next few weeks. These instructions provide you with information about caring for yourself after your procedure. Your health care provider may also give you more specific instructions. Your treatment has been planned according to current medical practices, but problems sometimes occur. Call your health care provider if you have any problems or questions after your procedure. What can I expect after the procedure? After the procedure, it is common to have:  Pain.  Soreness and numbness in your incision areas.  Vaginal bleeding and discharge.  Constipation.  Temporary problems emptying the bladder.  Feelings of sadness or other emotions.  Follow these instructions at home: Medicines  Take over-the-counter and prescription medicines only as told by your health care provider.  If you were prescribed an antibiotic medicine, take it as told by your health care provider. Do not stop taking the antibiotic even if you start to feel better.  Do not drive or operate heavy machinery while taking prescription pain medicine. Activity  Return to your normal activities as told by your health care provider. Ask your health care provider what activities are safe for you.  Get regular exercise as told by your health care provider. You may be told to take short walks every day and go farther each time.  Do not lift anything that is heavier than 10 lb (4.5 kg). General instructions   Do not put anything in your vagina for 6 weeks after your surgery or as told by your health care provider. This includes tampons and douches.  Do not have sex until your health care provider says you can.  Do not take baths, swim, or use a hot tub until your health care provider approves.  Drink enough fluid to keep your urine clear or pale yellow.  Do not drive for 24 hours if you were given a  sedative.  Keep all follow-up visits as told by your health care provider. This is important. Contact a health care provider if:  Your pain medicine is not helping.  You have a fever.  You have redness, swelling, or pain at your incision site.  You have blood, pus, or a bad-smelling discharge from your vagina.  You continue to have difficulty urinating. Get help right away if:  You have severe abdominal or back pain.  You have heavy bleeding from your vagina.  You have chest pain or shortness of breath. This information is not intended to replace advice given to you by your health care provider. Make sure you discuss any questions you have with your health care provider. Document Released: 11/30/2015 Document Revised: 01/14/2016 Document Reviewed: 08/23/2015 Elsevier Interactive Patient Education  [employer].

## 2018-01-23 NOTE — H&P (View-Only) (Signed)
PREOPERATIVE HISTORY AND PHYSICAL  Date of surgery: 02/12/2018 Procedure: 1.  TVH BSO 2.  Anterior/posterior colporrhaphy's Diagnosis: Symptomatic procidentia with cystocele (3rd degree) and rectocele   Patient is a 72 y.o. G2P2058female scheduled for surgery on 02/12/2018.  Previously she had been using pessary for medical management of pelvic organ prolapse.  She now desires definitive surgery.   Pertinent Gynecological History: History of uterine procidentia, third-degree cystocele, and moderate rectocele Previously managed with gel horn pessary 17 mm short stem History of vaginal ulceration from pessary in the past, now resolved No history of abnormal Pap smears Not sexually active  OB History    Gravida  2   Para  2   Term  2   Preterm      AB      Living  2     SAB      TAB      Ectopic      Multiple      Live Births  2          No LMP recorded. Patient is postmenopausal.    Past Medical History:  Diagnosis Date  . Arthritis   . Atrophy of vagina   . Cystocele   . GERD (gastroesophageal reflux disease)   . Glaucoma   . Glaucoma   . Rectocele   . Uveitis    left    Past Surgical History:  Procedure Laterality Date  . CATARACT EXTRACTION, BILATERAL    . TUBAL LIGATION      OB History  Gravida Para Term Preterm AB Living  2 2 2     2   SAB TAB Ectopic Multiple Live Births          2    # Outcome Date GA Lbr Len/2nd Weight Sex Delivery Anes PTL Lv  2 Term 1978   8 lb (3.629 kg) M Vag-Spont   LIV  1 Term 1975   7 lb 8 oz (3.402 kg) F Vag-Spont   LIV    Social History   Socioeconomic History  . Marital status: Widowed    Spouse name: Not on file  . Number of children: Not on file  . Years of education: Not on file  . Highest education level: Not on file  Occupational History  . Not on file  Social Needs  . Financial resource strain: Not on file  . Food insecurity:    Worry: Not on file    Inability: Not on file  .  Transportation needs:    Medical: Not on file    Non-medical: Not on file  Tobacco Use  . Smoking status: Never Smoker  . Smokeless tobacco: Never Used  Substance and Sexual Activity  . Alcohol use: Yes    Comment: wine daily  . Drug use: No  . Sexual activity: Not Currently    Birth control/protection: Surgical  Lifestyle  . Physical activity:    Days per week: Not on file    Minutes per session: Not on file  . Stress: Not on file  Relationships  . Social connections:    Talks on phone: Not on file    Gets together: Not on file    Attends religious service: Not on file    Active member of club or organization: Not on file    Attends meetings of clubs or organizations: Not on file    Relationship status: Not on file  Other Topics Concern  . Not on file  Social History  Narrative   Recently moved to [facility] from [place].   She is primary caregiver for her husband who has Alzheimers.   Two children.      Retired Engineer, production.      Desires CPR.   Has living will- SCANNED INTO CHART   Would not want prolonged life support if futile.                   Family History  Problem Relation Age of Onset  . Aortic stenosis Mother   . Heart disease Mother   . Osteoporosis Mother   . Cancer Father        prostate  . Heart disease Father   . Multiple sclerosis Daughter   . Breast cancer Maternal Aunt      (Not in a hospital admission)  Allergies  Allergen Reactions  . Xalatan [Latanoprost]     Caused eye to be red, swollen,itchy    Review of Systems Constitutional: No recent fever/chills/sweats Respiratory: No recent cough/bronchitis Cardiovascular: No chest pain Gastrointestinal: No recent nausea/vomiting/diarrhea Genitourinary: No UTI symptoms Hematologic/lymphatic:No history of coagulopathy or recent blood thinner use    Objective:    BP 107/61   Pulse 79   Ht 5\' 5"  (1.651 m)   Wt 122 lb 11.2 oz (55.7 kg)   BMI 20.42 kg/m   General:    Normal  Skin:   normal  HEENT:  Normal  Neck:  Supple without Adenopathy or Thyromegaly  Lungs:   Heart:              Breasts:   Abdomen:  Pelvis:  M/S   Extremeties:  Neuro:    clear to auscultation bilaterally   Normal without murmur   Not Examined   soft, non-tender; bowel sounds normal; no masses,  no organomegaly   Exam deferred to OR  No CVAT  Warm/Dry   Normal        01/04/2018 Pelvic exam: External genitalia-normal BUS-normal Vagina-third-degree cystourethrocele was present; no significant secretions; malodor previously noted is resolved; mild hyperemia previously noted along the left vaginal sidewall is resolved; previously noted shallow ulceration is re-epithelializing and nonfriable Cervix-parous; 7 mm cervical polyp at 7:00 is unchanged; no cervical motion tenderness Uterus-small mobile, nontender Adnexa-nonpalpable nontender Rectovaginal-normal external exam    Assessment:    Uterine procidentia Third-degree cystocele Moderate rectocele   Plan:  TVH BSO with anterior/posterior colporrhaphy  Preop counseling: The patient is undergo TVH BSO with anterior/posterior colporrhaphy for management of symptomatic pelvic relaxation.  She is understanding of the planned procedures and is aware of and is accepting of all surgical risks which include but are not limited to bleeding, infection, pelvic organ injury with need for repair, blood clot disorders, anesthesia risk, etc.  All questions have been answered.  Informed consent is given.  Patient is ready willing to proceed with surgery as scheduled.  Brayton Mars, MD  Note: This dictation was prepared with Dragon dictation along with smaller phrase technology. Any transcriptional errors that result from this process are unintentional.

## 2018-02-05 ENCOUNTER — Other Ambulatory Visit: Payer: Self-pay

## 2018-02-05 ENCOUNTER — Encounter
Admission: RE | Admit: 2018-02-05 | Discharge: 2018-02-05 | Disposition: A | Discharge status: Home / Self Care | Payer: Medicare Other | Source: Ambulatory Visit | Attending: Obstetrics and Gynecology | Admitting: Obstetrics and Gynecology

## 2018-02-05 DIAGNOSIS — I341 Nonrheumatic mitral (valve) prolapse: Secondary | ICD-10-CM | POA: Diagnosis not present

## 2018-02-05 DIAGNOSIS — Z01812 Encounter for preprocedural laboratory examination: Secondary | ICD-10-CM | POA: Diagnosis not present

## 2018-02-05 DIAGNOSIS — I498 Other specified cardiac arrhythmias: Secondary | ICD-10-CM | POA: Diagnosis not present

## 2018-02-05 DIAGNOSIS — Z01810 Encounter for preprocedural cardiovascular examination: Secondary | ICD-10-CM | POA: Diagnosis not present

## 2018-02-05 HISTORY — DX: Cardiac murmur, unspecified: R01.1

## 2018-02-05 LAB — CBC WITH DIFFERENTIAL/PLATELET
Basophils Absolute: 0.1 10*3/uL (ref 0–0.1)
Basophils Relative: 1 %
EOS ABS: 0.2 10*3/uL (ref 0–0.7)
EOS PCT: 2 %
HCT: 40.6 % (ref 35.0–47.0)
Hemoglobin: 13.8 g/dL (ref 12.0–16.0)
LYMPHS PCT: 16 %
Lymphs Abs: 1.4 10*3/uL (ref 1.0–3.6)
MCH: 33 pg (ref 26.0–34.0)
MCHC: 34.1 g/dL (ref 32.0–36.0)
MCV: 96.9 fL (ref 80.0–100.0)
MONO ABS: 0.8 10*3/uL (ref 0.2–0.9)
MONOS PCT: 9 %
Neutro Abs: 6.4 10*3/uL (ref 1.4–6.5)
Neutrophils Relative %: 72 %
PLATELETS: 214 10*3/uL (ref 150–440)
RBC: 4.19 MIL/uL (ref 3.80–5.20)
RDW: 13.4 % (ref 11.5–14.5)
WBC: 8.9 10*3/uL (ref 3.6–11.0)

## 2018-02-05 LAB — TYPE AND SCREEN
ABO/RH(D): A POS
Antibody Screen: NEGATIVE

## 2018-02-05 LAB — RAPID HIV SCREEN (HIV 1/2 AB+AG)
HIV 1/2 Antibodies: NONREACTIVE
HIV-1 P24 ANTIGEN - HIV24: NONREACTIVE

## 2018-02-05 NOTE — Pre-Procedure Instructions (Signed)
Would like to have spinal anesthesia.

## 2018-02-05 NOTE — Patient Instructions (Signed)
Your procedure is scheduled on: 02/12/18 Mon Report to Same Day Surgery 2nd floor medical mall [facility] Entrance-take elevator on left to 2nd floor.  Check in with surgery information desk.) To find out your arrival time please call [phone] between 1PM - 3PM on 02/12/18 Fri  Remember: Instructions that are not followed completely may result in serious medical risk, up to and including death, or upon the discretion of your surgeon and anesthesiologist your surgery may need to be rescheduled.    _x___ 1. Do not eat food after midnight the night before your procedure. You may drink clear liquids up to 2 hours before you are scheduled to arrive at the hospital for your procedure.  Do not drink clear liquids within 2 hours of your scheduled arrival to the hospital.  Clear liquids include  --Water or Apple juice without pulp  --Clear carbohydrate beverage such as ClearFast or Gatorade  --Black Coffee or Clear Tea (No milk, no creamers, do not add anything to                  the coffee or Tea Type 1 and type 2 diabetics should only drink water.  No gum chewing or hard candies.     __x__ 2. No Alcohol for 24 hours before or after surgery.   __x__3. No Smoking or e-cigarettes for 24 prior to surgery.  Do not use any chewable tobacco products for at least 6 hour prior to surgery   ____  4. Bring all medications with you on the day of surgery if instructed.    __x__ 5. Notify your doctor if there is any change in your medical condition     (cold, fever, infections).    x___6. On the morning of surgery brush your teeth with toothpaste and water.  You may rinse your mouth with mouth wash if you wish.  Do not swallow any toothpaste or mouthwash.   Do not wear jewelry, make-up, hairpins, clips or nail polish.  Do not wear lotions, powders, or perfumes. You may wear deodorant.  Do not shave 48 hours prior to surgery. Men may shave face and neck.  Do not bring valuables to the hospital.     [facility] is not responsible for any belongings or valuables.               Contacts, dentures or bridgework may not be worn into surgery.  Leave your suitcase in the car. After surgery it may be brought to your room.  For patients admitted to the hospital, discharge time is determined by your                       treatment team.  _  Patients discharged the day of surgery will not be allowed to drive home.  You will need someone to drive you home and stay with you the night of your procedure.    Please read over the following fact sheets that you were given:   [facility] Preparing for Surgery and or MRSA Information   _x___ Take anti-hypertensive listed below, cardiac, seizure, asthma,     anti-reflux and psychiatric medicines. These include:  1. brimonidine (ALPHAGAN) 0.2 % ophthalmic solution  2.dorzolamide-timolol (COSOPT) 22.3-6.8 MG/ML ophthalmic solution  3.  4.  5.  6.  ____Fleets enema or Magnesium Citrate as directed.   _x___ Use CHG Soap or sage wipes as directed on instruction sheet   ____ Use inhalers on the day  of surgery and bring to hospital day of surgery  ____ Stop Metformin and Janumet 2 days prior to surgery.    ____ Take 1/2 of usual insulin dose the night before surgery and none on the morning     surgery.   _x___ Follow recommendations from Cardiologist, Pulmonologist or PCP regarding          stopping Aspirin, Coumadin, Plavix ,Eliquis, Effient, or Pradaxa, and Pletal.  Stopped Aspirin a week ago.  X____Stop Anti-inflammatories such as Advil, Aleve, Ibuprofen, Motrin, Naproxen, Naprosyn, Goodies powders or aspirin products. OK to take Tylenol and                          Celebrex.   _x___ Stop supplements until after surgery.  But may continue Vitamin D, Vitamin B,       and multivitamin.  To stop fish oil today.   ____ Bring C-Pap to the hospital.

## 2018-02-06 LAB — SYPHILIS: RPR W/REFLEX TO RPR TITER AND TREPONEMAL ANTIBODIES, TRADITIONAL SCREENING AND DIAGNOSIS ALGORITHM: RPR Ser Ql: NONREACTIVE

## 2018-02-08 NOTE — Telephone Encounter (Signed)
Verification of benefits have been processed and an approval has been received for pts prolia injection. Pts estimated cost are appx $0. This is only an estimate and cannot be confirmed until benefits are paid. Please advise pt and schedule if needed. If scheduled, once the injection is received, pls contact me back with the date it was received so that I am able to update prolia folder. Thanks

## 2018-02-08 NOTE — Telephone Encounter (Signed)
Spoke to pt and advised. States she is scheduled to have surgery on Monday and is wanting to wait until the first or second week in July to complete bloodwork and prolia injection. Note made to contact pt back

## 2018-02-09 NOTE — Telephone Encounter (Signed)
Noted  

## 2018-02-11 MED ORDER — CEFAZOLIN SODIUM-DEXTROSE 2-4 GM/100ML-% IV SOLN
2.0000 g | INTRAVENOUS | Status: AC
  Administered 2018-02-12: 2 g via INTRAVENOUS

## 2018-02-12 ENCOUNTER — Observation Stay
Admission: RE | Admit: 2018-02-12 | Discharge: 2018-02-13 | Disposition: A | Discharge status: Home / Self Care | Payer: Medicare Other | Source: Ambulatory Visit | Attending: Obstetrics and Gynecology | Admitting: Obstetrics and Gynecology

## 2018-02-12 ENCOUNTER — Ambulatory Visit: Payer: Medicare Other | Admitting: Anesthesiology

## 2018-02-12 ENCOUNTER — Encounter: Payer: Self-pay | Admitting: Emergency Medicine

## 2018-02-12 ENCOUNTER — Encounter
Admission: RE | Disposition: A | Discharge status: Home / Self Care | Payer: Self-pay | Source: Ambulatory Visit | Attending: Obstetrics and Gynecology

## 2018-02-12 ENCOUNTER — Other Ambulatory Visit: Payer: Self-pay

## 2018-02-12 DIAGNOSIS — N888 Other specified noninflammatory disorders of cervix uteri: Secondary | ICD-10-CM | POA: Diagnosis not present

## 2018-02-12 DIAGNOSIS — Z7982 Long term (current) use of aspirin: Secondary | ICD-10-CM | POA: Diagnosis not present

## 2018-02-12 DIAGNOSIS — N765 Ulceration of vagina: Secondary | ICD-10-CM | POA: Insufficient documentation

## 2018-02-12 DIAGNOSIS — D259 Leiomyoma of uterus, unspecified: Secondary | ICD-10-CM | POA: Diagnosis not present

## 2018-02-12 DIAGNOSIS — Z79899 Other long term (current) drug therapy: Secondary | ICD-10-CM | POA: Diagnosis not present

## 2018-02-12 DIAGNOSIS — N8110 Cystocele, unspecified: Secondary | ICD-10-CM | POA: Diagnosis not present

## 2018-02-12 DIAGNOSIS — Z888 Allergy status to other drugs, medicaments and biological substances status: Secondary | ICD-10-CM | POA: Insufficient documentation

## 2018-02-12 DIAGNOSIS — N816 Rectocele: Secondary | ICD-10-CM | POA: Diagnosis not present

## 2018-02-12 DIAGNOSIS — M1990 Unspecified osteoarthritis, unspecified site: Secondary | ICD-10-CM | POA: Diagnosis not present

## 2018-02-12 DIAGNOSIS — N813 Complete uterovaginal prolapse: Secondary | ICD-10-CM | POA: Diagnosis not present

## 2018-02-12 DIAGNOSIS — K219 Gastro-esophageal reflux disease without esophagitis: Secondary | ICD-10-CM | POA: Diagnosis not present

## 2018-02-12 DIAGNOSIS — R011 Cardiac murmur, unspecified: Secondary | ICD-10-CM | POA: Insufficient documentation

## 2018-02-12 DIAGNOSIS — N838 Other noninflammatory disorders of ovary, fallopian tube and broad ligament: Secondary | ICD-10-CM | POA: Insufficient documentation

## 2018-02-12 DIAGNOSIS — N814 Uterovaginal prolapse, unspecified: Secondary | ICD-10-CM | POA: Diagnosis not present

## 2018-02-12 DIAGNOSIS — Z8249 Family history of ischemic heart disease and other diseases of the circulatory system: Secondary | ICD-10-CM | POA: Diagnosis not present

## 2018-02-12 DIAGNOSIS — N858 Other specified noninflammatory disorders of uterus: Secondary | ICD-10-CM | POA: Diagnosis not present

## 2018-02-12 DIAGNOSIS — N819 Female genital prolapse, unspecified: Secondary | ICD-10-CM | POA: Diagnosis not present

## 2018-02-12 DIAGNOSIS — Z90710 Acquired absence of both cervix and uterus: Secondary | ICD-10-CM | POA: Diagnosis present

## 2018-02-12 HISTORY — PX: VAGINAL HYSTERECTOMY: SHX2639

## 2018-02-12 LAB — ABO/RH: ABO/RH(D): A POS

## 2018-02-12 SURGERY — HYSTERECTOMY, VAGINAL
Anesthesia: Spinal | Laterality: Bilateral | Wound class: Clean Contaminated

## 2018-02-12 MED ORDER — ONDANSETRON HCL 4 MG/2ML IJ SOLN: 4.0000 mg | Freq: Four times a day (QID) | INTRAMUSCULAR | Status: DC | PRN

## 2018-02-12 MED ORDER — BUPIVACAINE HCL (PF) 0.5 % IJ SOLN
INTRAMUSCULAR | Status: AC
  Filled 2018-02-12: qty 10

## 2018-02-12 MED ORDER — PROPOFOL 10 MG/ML IV BOLUS
INTRAVENOUS | Status: AC
  Filled 2018-02-12: qty 20

## 2018-02-12 MED ORDER — DIPHENHYDRAMINE HCL 50 MG/ML IJ SOLN
INTRAMUSCULAR | Status: AC
  Filled 2018-02-12: qty 1

## 2018-02-12 MED ORDER — FENTANYL CITRATE (PF) 100 MCG/2ML IJ SOLN
INTRAMUSCULAR | Status: AC
  Filled 2018-02-12: qty 2

## 2018-02-12 MED ORDER — DOCUSATE SODIUM 100 MG PO CAPS
100.0000 mg | ORAL_CAPSULE | Freq: Two times a day (BID) | ORAL | Status: DC
  Administered 2018-02-12 – 2018-02-13 (×2): 100 mg via ORAL
  Filled 2018-02-12 (×2): qty 1

## 2018-02-12 MED ORDER — ONDANSETRON HCL 4 MG/2ML IJ SOLN: 4.0000 mg | Freq: Once | INTRAMUSCULAR | Status: DC | PRN

## 2018-02-12 MED ORDER — FENTANYL CITRATE (PF) 100 MCG/2ML IJ SOLN
25.0000 ug | INTRAMUSCULAR | Status: DC | PRN
  Administered 2018-02-12 (×4): 25 ug via INTRAVENOUS

## 2018-02-12 MED ORDER — KETOROLAC TROMETHAMINE 30 MG/ML IJ SOLN
15.0000 mg | Freq: Four times a day (QID) | INTRAMUSCULAR | Status: DC
  Administered 2018-02-12 – 2018-02-13 (×3): 15 mg via INTRAVENOUS
  Filled 2018-02-12 (×2): qty 1

## 2018-02-12 MED ORDER — MORPHINE SULFATE (PF) 2 MG/ML IV SOLN
1.0000 mg | INTRAVENOUS | Status: DC | PRN
  Administered 2018-02-12: 1 mg via INTRAVENOUS
  Administered 2018-02-12 (×2): 2 mg via INTRAVENOUS
  Filled 2018-02-12 (×3): qty 1

## 2018-02-12 MED ORDER — PROPOFOL 500 MG/50ML IV EMUL
INTRAVENOUS | Status: DC | PRN
  Administered 2018-02-12: 50 ug/kg/min via INTRAVENOUS

## 2018-02-12 MED ORDER — LIDOCAINE HCL (PF) 2 % IJ SOLN
INTRAMUSCULAR | Status: AC
  Filled 2018-02-12: qty 10

## 2018-02-12 MED ORDER — LACTATED RINGERS IV SOLN: INTRAVENOUS | Status: DC

## 2018-02-12 MED ORDER — KETOROLAC TROMETHAMINE 30 MG/ML IJ SOLN
15.0000 mg | Freq: Four times a day (QID) | INTRAMUSCULAR | Status: DC
  Filled 2018-02-12: qty 1

## 2018-02-12 MED ORDER — DIPHENHYDRAMINE HCL 50 MG/ML IJ SOLN
INTRAMUSCULAR | Status: DC | PRN
  Administered 2018-02-12: 25 mg via INTRAVENOUS

## 2018-02-12 MED ORDER — LIDOCAINE HCL (CARDIAC) PF 100 MG/5ML IV SOSY
PREFILLED_SYRINGE | INTRAVENOUS | Status: DC | PRN
  Administered 2018-02-12: 60 mg via INTRAVENOUS

## 2018-02-12 MED ORDER — ACETAMINOPHEN 500 MG PO TABS
1000.0000 mg | ORAL_TABLET | Freq: Four times a day (QID) | ORAL | Status: DC
  Administered 2018-02-12 – 2018-02-13 (×4): 1000 mg via ORAL
  Filled 2018-02-12 (×4): qty 2

## 2018-02-12 MED ORDER — ESTROGENS, CONJUGATED 0.625 MG/GM VA CREA
TOPICAL_CREAM | VAGINAL | Status: DC | PRN
  Administered 2018-02-12: 1 via VAGINAL

## 2018-02-12 MED ORDER — BISACODYL 10 MG RE SUPP: 10.0000 mg | Freq: Every day | RECTAL | Status: DC | PRN

## 2018-02-12 MED ORDER — BUPIVACAINE HCL (PF) 0.5 % IJ SOLN
INTRAMUSCULAR | Status: DC | PRN
  Administered 2018-02-12: 3 mL

## 2018-02-12 MED ORDER — ROCURONIUM BROMIDE 50 MG/5ML IV SOLN
INTRAVENOUS | Status: AC
  Filled 2018-02-12: qty 1

## 2018-02-12 MED ORDER — FENTANYL CITRATE (PF) 100 MCG/2ML IJ SOLN
INTRAMUSCULAR | Status: AC
  Administered 2018-02-12: 25 ug via INTRAVENOUS
  Filled 2018-02-12: qty 2

## 2018-02-12 MED ORDER — LACTATED RINGERS IV SOLN
INTRAVENOUS | Status: DC
  Administered 2018-02-12: 10:00:00 via INTRAVENOUS

## 2018-02-12 MED ORDER — LACTATED RINGERS IV SOLN
INTRAVENOUS | Status: DC
  Administered 2018-02-12 – 2018-02-13 (×3): via INTRAVENOUS

## 2018-02-12 MED ORDER — SIMETHICONE 80 MG PO CHEW
80.0000 mg | CHEWABLE_TABLET | Freq: Four times a day (QID) | ORAL | Status: DC | PRN
Start: 2018-02-12 — End: 2018-02-13
  Administered 2018-02-12: 80 mg via ORAL
  Filled 2018-02-12: qty 1

## 2018-02-12 MED ORDER — PROPOFOL 10 MG/ML IV BOLUS
INTRAVENOUS | Status: DC | PRN
  Administered 2018-02-12: 30 mg via INTRAVENOUS
  Administered 2018-02-12: 20 mg via INTRAVENOUS
  Administered 2018-02-12: 40 mg via INTRAVENOUS

## 2018-02-12 MED ORDER — FAMOTIDINE 20 MG PO TABS
20.0000 mg | ORAL_TABLET | Freq: Once | ORAL | Status: AC
  Administered 2018-02-12: 20 mg via ORAL

## 2018-02-12 SURGICAL SUPPLY — 34 items
BAG URINE DRAINAGE (UROLOGICAL SUPPLIES) ×3 IMPLANT
CANISTER SUCT 1200ML W/VALVE (MISCELLANEOUS) ×3 IMPLANT
CATH FOLEY 2WAY  5CC 16FR (CATHETERS) ×2
CATH URTH 16FR FL 2W BLN LF (CATHETERS) ×1 IMPLANT
DRAPE PERI LITHO V/GYN (MISCELLANEOUS) ×3 IMPLANT
DRAPE SHEET LG 3/4 BI-LAMINATE (DRAPES) ×3 IMPLANT
DRAPE UNDER BUTTOCK W/FLU (DRAPES) ×3 IMPLANT
ELECT REM PT RETURN 9FT ADLT (ELECTROSURGICAL) ×3
ELECTRODE REM PT RTRN 9FT ADLT (ELECTROSURGICAL) ×1 IMPLANT
GAUZE PACK 2X3YD (MISCELLANEOUS) ×3 IMPLANT
GLOVE BIO SURGEON STRL SZ8 (GLOVE) ×6 IMPLANT
GLOVE INDICATOR 8.0 STRL GRN (GLOVE) ×3 IMPLANT
GOWN STRL REUS W/ TWL LRG LVL3 (GOWN DISPOSABLE) ×2 IMPLANT
GOWN STRL REUS W/ TWL XL LVL3 (GOWN DISPOSABLE) ×1 IMPLANT
GOWN STRL REUS W/TWL LRG LVL3 (GOWN DISPOSABLE) ×4
GOWN STRL REUS W/TWL XL LVL3 (GOWN DISPOSABLE) ×2
KIT TURNOVER CYSTO (KITS) ×3 IMPLANT
LABEL OR SOLS (LABEL) ×3 IMPLANT
NS IRRIG 500ML POUR BTL (IV SOLUTION) ×3 IMPLANT
PACK BASIN MINOR ARMC (MISCELLANEOUS) ×3 IMPLANT
PAD OB MATERNITY 4.3X12.25 (PERSONAL CARE ITEMS) ×3 IMPLANT
PAD PREP 24X41 OB/GYN DISP (PERSONAL CARE ITEMS) ×3 IMPLANT
SPONGE XRAY 4X4 16PLY STRL (MISCELLANEOUS) ×3 IMPLANT
SUT CHROMIC 0 CT 1 (SUTURE) ×6 IMPLANT
SUT CHROMIC 1-0 (SUTURE) ×3 IMPLANT
SUT CHROMIC 2 0 CT 1 (SUTURE) ×15 IMPLANT
SUT CHROMIC 2 0 SH (SUTURE) ×6 IMPLANT
SUT VIC AB 0 CT1 27 (SUTURE) ×6
SUT VIC AB 0 CT1 27XCR 8 STRN (SUTURE) ×3 IMPLANT
SUT VIC AB 0 CT1 36 (SUTURE) ×9 IMPLANT
SUT VIC AB 2-0 CT1 (SUTURE) ×6 IMPLANT
SUT VIC AB 2-0 CT1 36 (SUTURE) ×3 IMPLANT
SUT VIC AB 2-0 UR6 27 (SUTURE) ×6 IMPLANT
SYR 10ML LL (SYRINGE) ×3 IMPLANT

## 2018-02-12 NOTE — Anesthesia Preprocedure Evaluation (Addendum)
Anesthesia Evaluation  Patient identified by MRN, date of birth, ID band Patient awake    Reviewed: Allergy & Precautions, NPO status , Patient's Chart, lab work & pertinent test results  History of Anesthesia Complications Negative for: history of anesthetic complications  Airway Mallampati: II       Dental   Pulmonary neg sleep apnea, neg COPD,           Cardiovascular (-) hypertension(-) Past MI and (-) CHF (-) dysrhythmias + Valvular Problems/Murmurs (mild mitral stenosis, no tx)      Neuro/Psych neg Seizures    GI/Hepatic Neg liver ROS, GERD  Poorly Controlled,  Endo/Other  neg diabetes  Renal/GU negative Renal ROS     Musculoskeletal   Abdominal   Peds  Hematology   Anesthesia Other Findings   Reproductive/Obstetrics                            Anesthesia Physical Anesthesia Plan  ASA: II  Anesthesia Plan: Spinal   Post-op Pain Management:    Induction:   PONV Risk Score and Plan:   Airway Management Planned:   Additional Equipment:   Intra-op Plan:   Post-operative Plan:   Informed Consent: I have reviewed the patients History and Physical, chart, labs and discussed the procedure including the risks, benefits and alternatives for the proposed anesthesia with the patient or authorized representative who has indicated his/her understanding and acceptance.     Plan Discussed with:   Anesthesia Plan Comments:         Anesthesia Quick Evaluation

## 2018-02-12 NOTE — Anesthesia Post-op Follow-up Note (Signed)
Anesthesia QCDR form completed.        

## 2018-02-12 NOTE — Anesthesia Procedure Notes (Signed)
Spinal  Patient location during procedure: OR Start time: 02/12/2018 10:25 AM End time: 02/12/2018 10:31 AM Staffing Anesthesiologist: Gunnar Fusi, MD Resident/CRNA: Jonna Clark, CRNA Performed: resident/CRNA  Preanesthetic Checklist Completed: patient identified, site marked, surgical consent, pre-op evaluation, timeout performed, IV checked, risks and benefits discussed and monitors and equipment checked Spinal Block Patient position: sitting Prep: Betadine Patient monitoring: heart rate, continuous pulse ox, blood pressure and cardiac monitor Approach: midline Location: L4-5 Injection technique: single-shot Needle Needle type: Whitacre and Introducer  Needle gauge: 24 G Needle length: 9 cm Assessment Sensory level: T8 Additional Notes Negative paresthesia. Negative blood return. Positive free-flowing CSF. Expiration date of kit checked and confirmed. Patient tolerated procedure well, without complications.

## 2018-02-12 NOTE — Progress Notes (Signed)
Error, Vernia Buff, R.N. Will still have patient.

## 2018-02-12 NOTE — Anesthesia Postprocedure Evaluation (Signed)
Anesthesia Post Note  Patient: Christy [name]  Procedure(s) Performed: HYSTERECTOMY VAGINAL WITH BILATERAL SALPINGO OOPHERECTOMY (Bilateral )  Patient location during evaluation: PACU Anesthesia Type: Spinal Level of consciousness: awake and alert Pain management: pain level controlled Vital Signs Assessment: post-procedure vital signs reviewed and stable Respiratory status: spontaneous breathing and respiratory function stable Cardiovascular status: stable Anesthetic complications: no     Last Vitals:  Vitals:   02/12/18 1242 02/12/18 1245  BP: 101/66   Pulse: 69 64  Resp:  13  Temp:    SpO2: 100% 100%    Last Pain:  Vitals:   02/12/18 0937  TempSrc: Temporal  PainSc: 0-No pain                 Liesa Tsan K

## 2018-02-12 NOTE — Op Note (Signed)
OPERATIVE NOTE:  Christy [name] PROCEDURE DATE: 02/12/2018   PREOPERATIVE DIAGNOSIS: 1.  Procidentia 2.  Cystocele 3.  Rectocele  POSTOPERATIVE DIAGNOSIS: Same as above  PROCEDURE: TVH BSO with anterior/posterior colporrhaphy SURGEON:  Christy Mars, MD ASSISTANTS: Christy Fend, MD ANESTHESIA: Spinal INDICATIONS: 72 y.o. [phone] with uterine procidentia and symptomatic cystocele and rectocele, desires definitive surgery due to suboptimal management with pessary  FINDINGS: Small uterus, atrophic tubes and ovaries, third-degree cystocele, moderate rectocele   I/O's: Total I/O In: 700 [I.V.:700] Out: 300 [Urine:200; Blood:100] COUNTS:  YES SPECIMENS: Uterus and cervix with bilateral fallopian tubes and bilateral ovaries ANTIBIOTIC PROPHYLAXIS:Ancef 2 grams COMPLICATIONS: None immediate  PROCEDURE IN DETAIL: Patient was brought to the operating room and placed in the sitting position.  Spinal anesthetic was introduced without difficulty.  She was placed in dorsolithotomy position with the aid of candycane stirrups.  A ChloraPrep perineal and intravaginal prep and drape was performed in standard fashion. Timeout was completed. Foley catheter was placed in the bladder and was draining clear yellow urine.  Weighted speculum was placed into the vagina.  A double-tooth tenaculum was placed onto the cervix.  Posterior colpotomy was made with Mayo scissors.  Uterosacral ligaments were clamped cut and stick tied using 0 Vicryl suture.  The cervix was circumscribed with Bovie cautery and the vagina and bladder was dissected off of the lower uterine segment through sharp and blunt dissection.  Eventually the anterior cul-de-sac was entered.  The cardinal broad ligament complexes were sequentially clamped cut and stick tied using 0 Vicryl suture.  Finally the utero-ovarian ligaments were clamped cut and stick tied and the uterus was removed from the operative field.  These pedicles were  suture-ligated.  Right and left adnexa were then removed; they were grasped with Babcock clamp and curved Heaney clamps were used to cross-clamp the infundibulopelvic ligament.  The adnexa were excised with Mayo scissors and the pedicles were stick tied using 0 Vicryl suture.  Good hemostasis was obtained.  The posterior cuff was run with a 0 Vicryl suture in a baseball stitch running locking manner.  Peritoneum was reapproximated using a pursestring stitch of 0 Vicryl. Anterior colporrhaphy was then performed in standard fashion.  Allis clamps were used to grasp the distal margins of the vagina overlying the bladder.  The bladder was undermined with Metzenbaum scissors and incised in the midline.  Allis Adair clamps were used to help facilitate exposure.  This process was carried out to within several centimeters of the urethral meatus.  The perivesical fascia was dissected off of the vagina through sharp and blunt dissection.  Once the cystocele was adequately mobilized the cystocele was reduced using 2-0 Vicryl sutures in a vertical mattress technique.  Excess vagina was excised and the vagina was then reapproximated in the midline using 2-0 chromic sutures in a simple interrupted manner. Posterior colporrhaphy was then performed.  Allis clamps were used to grasp the lateral margins of the introitus.  A diamond-shaped wedge of tissue was removed from the perineum and vagina to remove scar.  Posterior vagina was then undermined in the midline using Metzenbaum scissors.  This mucosa was then incised in the midline and the margins of the vagina were grasped with Christy [name] clamps.  This process was carried out to the apex of the vagina.  The perirectal fascia was dissected off of the vagina through sharp and blunt dissection.  Levator muscles were then reapproximated in the midline using 2-0 Vicryl suture in a simple  interrupted manner.  Excess vaginal mucosa was trimmed.  The vagina was then reapproximated  midline using 2-0 chromic suture in a simple interrupted manner.  Following completion of the procedure the vagina was packed with Kerlix gauze and Premarin cream.  Patient was then awakened mobilized and taken to recovery room in satisfactory condition.  Christy [name] A. Christy Plants, MD, [facility]

## 2018-02-12 NOTE — Transfer of Care (Signed)
Immediate Anesthesia Transfer of Care Note  Patient: Christy [name]  Procedure(s) Performed: HYSTERECTOMY VAGINAL WITH BILATERAL SALPINGO OOPHERECTOMY (Bilateral )  Patient Location: PACU  Anesthesia Type:Spinal  Level of Consciousness: awake, alert  and oriented  Airway & Oxygen Therapy: Patient Spontanous Breathing and Patient connected to nasal cannula oxygen  Post-op Assessment: Report given to RN and Post -op Vital signs reviewed and stable  Post vital signs: Reviewed and stable  Last Vitals:  Vitals Value Taken Time  BP 101/66 02/12/2018 12:42 PM  Temp    Pulse 67 02/12/2018 12:43 PM  Resp 15 02/12/2018 12:43 PM  SpO2 100 % 02/12/2018 12:43 PM  Vitals shown include unvalidated device data.  Last Pain:  Vitals:   02/12/18 0937  TempSrc: Temporal  PainSc: 0-No pain         Complications: No apparent anesthesia complications

## 2018-02-12 NOTE — Interval H&P Note (Signed)
History and Physical Interval Note:  02/12/2018 9:43 AM  Christy [name]  has presented today for surgery, with the diagnosis of UTERINE PROLAPSE, CYSTOCELE, RECTOCELE  The various methods of treatment have been discussed with the patient and family. After consideration of risks, benefits and other options for treatment, the patient has consented to  Procedure(s): HYSTERECTOMY VAGINAL WITH BILATERAL SALPINGO OOPHERECTOMY (Bilateral) AND ANTERIOR/POSTERIOR COLPORRHAPHY as a surgical intervention .  The patient's history has been reviewed, patient examined, no change in status, stable for surgery.  I have reviewed the patient's chart and labs.  Questions were answered to the patient's satisfaction.     Hassell Done A Mathews Stuhr

## 2018-02-13 ENCOUNTER — Other Ambulatory Visit: Payer: Self-pay | Admitting: Obstetrics and Gynecology

## 2018-02-13 ENCOUNTER — Encounter: Payer: Self-pay | Admitting: Obstetrics and Gynecology

## 2018-02-13 DIAGNOSIS — D259 Leiomyoma of uterus, unspecified: Secondary | ICD-10-CM | POA: Diagnosis not present

## 2018-02-13 DIAGNOSIS — K219 Gastro-esophageal reflux disease without esophagitis: Secondary | ICD-10-CM | POA: Diagnosis not present

## 2018-02-13 DIAGNOSIS — N765 Ulceration of vagina: Secondary | ICD-10-CM | POA: Diagnosis not present

## 2018-02-13 DIAGNOSIS — N813 Complete uterovaginal prolapse: Secondary | ICD-10-CM | POA: Diagnosis not present

## 2018-02-13 DIAGNOSIS — N888 Other specified noninflammatory disorders of cervix uteri: Secondary | ICD-10-CM | POA: Diagnosis not present

## 2018-02-13 DIAGNOSIS — N838 Other noninflammatory disorders of ovary, fallopian tube and broad ligament: Secondary | ICD-10-CM | POA: Diagnosis not present

## 2018-02-13 LAB — HEMOGLOBIN: HEMOGLOBIN: 11.9 g/dL — AB (ref 12.0–16.0)

## 2018-02-13 MED ORDER — HYDROMORPHONE HCL 2 MG PO TABS: 2.0000 mg | ORAL_TABLET | ORAL | Status: DC | PRN

## 2018-02-13 MED ORDER — HYDROMORPHONE HCL 2 MG PO TABS: 2.0000 mg | ORAL_TABLET | ORAL | 0 refills | Status: DC | PRN

## 2018-02-13 MED ORDER — DOCUSATE SODIUM 100 MG PO CAPS: 100.0000 mg | ORAL_CAPSULE | Freq: Two times a day (BID) | ORAL | 0 refills | Status: DC

## 2018-02-13 MED ORDER — NITROFURANTOIN MONOHYD MACRO 100 MG PO CAPS: 100.0000 mg | ORAL_CAPSULE | Freq: Two times a day (BID) | ORAL | 1 refills | Status: DC

## 2018-02-13 MED ORDER — ACETAMINOPHEN 500 MG PO TABS: 1000.0000 mg | ORAL_TABLET | Freq: Four times a day (QID) | ORAL | 0 refills | Status: DC

## 2018-02-13 MED ORDER — IBUPROFEN 600 MG PO TABS: 600.0000 mg | ORAL_TABLET | Freq: Four times a day (QID) | ORAL | 0 refills | Status: DC

## 2018-02-13 MED ORDER — IBUPROFEN 600 MG PO TABS
600.0000 mg | ORAL_TABLET | Freq: Four times a day (QID) | ORAL | Status: DC
  Administered 2018-02-13 (×2): 600 mg via ORAL
  Filled 2018-02-13 (×3): qty 1

## 2018-02-13 NOTE — Progress Notes (Signed)
Patient discharged home. Discharge instructions, prescriptions and follow up appointment given to and reviewed with patient. Patient verbalized understanding. 

## 2018-02-13 NOTE — Discharge Summary (Signed)
Physician Discharge Summary  Patient ID: Christy [name] MRN: [id number] DOB/AGE: 09/19/1945 72 y.o.  Admit date: 02/12/2018 Discharge date: 02/13/2018  Admission Diagnoses: Pelvic Organ Prolapse (procidentia; cystocele; rectocele)  Discharge Diagnoses:  Same as above  Operative Procedures: Procedure(s): HYSTERECTOMY VAGINAL WITH BILATERAL SALPINGO OOPHERECTOMY (Bilateral) and anterior/posterior colporrhaphy  Hospital Course: Uncomplicated .   Significant Diagnostic Studies:  Lab Results  Component Value Date   HGB 11.9 (L) 02/13/2018   HGB 13.8 02/05/2018   HGB 13.3 10/05/2016   Lab Results  Component Value Date   HCT 40.6 02/05/2018   HCT 40.2 10/05/2016   HCT 43.2 06/09/2016   CBC Latest Ref Rng & Units 02/13/2018 02/05/2018 10/05/2016  WBC 3.6 - 11.0 K/uL - 8.9 11.4(H)  Hemoglobin 12.0 - 16.0 g/dL 11.9(L) 13.8 13.3  Hematocrit 35.0 - 47.0 % - 40.6 40.2  Platelets 150 - 440 K/uL - 214 163     Discharged Condition: good  Discharge Exam: Blood pressure 119/71, pulse 90, temperature 98.2 F (36.8 C), temperature source Oral, resp. rate 15, height 5\' 5"  (1.651 m), weight 122 lb 3.2 oz (55.4 kg), SpO2 96 %. Incision/Wound: No vaginal bleeding or discharge  Disposition:    Allergies as of 02/13/2018      Reactions   Xalatan [latanoprost] Itching, Swelling, Other (See Comments)   Caused eye to be red, swollen,itchy      Medication List    TAKE these medications   acetaminophen 500 MG tablet Commonly known as:  TYLENOL Take 2 tablets (1,000 mg total) by mouth 4 (four) times daily.   aspirin 81 MG tablet Take 81 mg by mouth daily.   brimonidine 0.2 % ophthalmic solution Commonly known as:  ALPHAGAN Place 1 drop into the left eye 2 (two) times daily.   CALCIUM CARBONATE-VITAMIN D PO Take 1 tablet by mouth 2 (two) times daily.   cholecalciferol 1000 units tablet Commonly known as:  VITAMIN D Take 1,000 Units by mouth daily.   Cranberry 500 MG Caps Take  500 mg by mouth daily.   denosumab 60 MG/ML Soln injection Commonly known as:  PROLIA Inject 60 mg into the skin every 6 (six) months. Administer in upper arm, thigh, or abdomen   docusate sodium 100 MG capsule Commonly known as:  COLACE Take 1 capsule (100 mg total) by mouth 2 (two) times daily.   dorzolamide-timolol 22.3-6.8 MG/ML ophthalmic solution Commonly known as:  COSOPT Place 1 drop into the left eye every 12 (twelve) hours.   estradiol 0.1 MG/GM vaginal cream Commonly known as:  ESTRACE INSERT ONE-HALF GRAM  VAGINALLY TWICE WEEKLY   Fish Oil 1200 MG Caps Take 2,400 mg by mouth daily.   GLUCOSAMINE CHONDROITIN COMPLX PO Take 1 tablet by mouth daily.   HYDROmorphone 2 MG tablet Commonly known as:  DILAUDID Take 1 tablet (2 mg total) by mouth every 4 (four) hours as needed for moderate pain or severe pain.   ibuprofen 600 MG tablet Commonly known as:  ADVIL,MOTRIN Take 1 tablet (600 mg total) by mouth 4 (four) times daily.   multivitamin tablet Take 1 tablet by mouth daily.      Follow-up Information    Assyria Morreale, Alanda Slim, MD. Go in 1 week(s).   Specialties:  Obstetrics and Gynecology, Radiology Why:  Postop check Contact information: [address] [place] [address] [phone]           Signed: Alanda Slim Berenis Corter 02/13/2018, 8:01 AM

## 2018-02-14 LAB — SURGICAL PATHOLOGY

## 2018-02-15 ENCOUNTER — Encounter: Payer: Self-pay | Admitting: Obstetrics and Gynecology

## 2018-02-15 ENCOUNTER — Ambulatory Visit (INDEPENDENT_AMBULATORY_CARE_PROVIDER_SITE_OTHER): Payer: Medicare Other | Admitting: Obstetrics and Gynecology

## 2018-02-15 VITALS — BP 149/84 | HR 91 | Ht 65.0 in | Wt 126.7 lb

## 2018-02-15 DIAGNOSIS — Z90710 Acquired absence of both cervix and uterus: Secondary | ICD-10-CM

## 2018-02-15 DIAGNOSIS — R339 Retention of urine, unspecified: Secondary | ICD-10-CM

## 2018-02-15 NOTE — Patient Instructions (Signed)
1.  Post void residual is 400 cc. 2.  Continue with leg bag urinary catheter drainage for another 5 days. 3.  Return in 5 days for post void residual check and possible removal of catheter

## 2018-02-15 NOTE — Progress Notes (Signed)
Chief complaint: 1.  Status post TVH BSO with anterior/posterior colporrhaphy 2.  Urinary retention  Patient presents for PVR check. In the hospital patient was voiding small amounts up to 125 cc volume; however, PVR demonstrated 900 cc in the bladder.  She has since had continuous bladder drainage with Foley catheter leg bag.  Urine is clear.  Patient is without pelvic discomfort.  OBJECTIVE: BP (!) 149/84   Pulse 91   Ht 5\' 5"  (1.651 m)   Wt 126 lb 11.2 oz (57.5 kg)   BMI 21.08 kg/m  Pleasant well-appearing female no acute distress. Abdomen: Soft, nontender Pelvic exam: External genitalia-minimal ecchymoses of left labia majora; BUS-normal; Foley catheter is removed  PROCEDURE: PVR check 16 French red Robinson catheter is used to drain bladder-400 cc residual following last void.  PROCEDURE: Reinsertion of leg bag catheter 14 French Foley is inserted under sterile conditions with production of clear urine.  Leg bag is applied.  ASSESSMENT: 1.  2 days status post TVH BSO with anterior/posterior colporrhaphy 2.  Continued urinary retention-400 cc  PLAN: 1.  14 French Foley is inserted with leg bag 2.  Return in 5 days for recheck 3.  Continue Macrobid twice daily antibiotic until next visit  Brayton Mars, MD  Note: This dictation was prepared with Dragon dictation along with smaller phrase technology. Any transcriptional errors that result from this process are unintentional.

## 2018-02-20 ENCOUNTER — Ambulatory Visit (INDEPENDENT_AMBULATORY_CARE_PROVIDER_SITE_OTHER): Payer: Medicare Other | Admitting: Obstetrics and Gynecology

## 2018-02-20 ENCOUNTER — Encounter: Payer: Self-pay | Admitting: Obstetrics and Gynecology

## 2018-02-20 ENCOUNTER — Encounter: Payer: Medicare Other | Admitting: Obstetrics and Gynecology

## 2018-02-20 VITALS — BP 153/89 | HR 90 | Ht 65.0 in | Wt 122.3 lb

## 2018-02-20 DIAGNOSIS — R339 Retention of urine, unspecified: Secondary | ICD-10-CM | POA: Diagnosis not present

## 2018-02-20 DIAGNOSIS — Z90710 Acquired absence of both cervix and uterus: Secondary | ICD-10-CM

## 2018-02-20 NOTE — Progress Notes (Signed)
Chief complaint: 1.  PVR check 2.  Urinary retention  Christy [name] presents for PVR check 8 days post surgery.  Status post TVH BSO with anterior posterior colporrhaphy on 02/19/2018.  Postoperative course was notable for urinary retention requiring a leg bag catheter.  Most significant PVR was 900 cc; PVR 5 days ago was notable for 400 cc.  Christy [name] is taking Macrobid antibiotic prophylaxis.  OBJECTIVE: BP (!) 153/89   Pulse 90   Ht 5\' 5"  (1.651 m)   Wt 122 lb 4.8 oz (55.5 kg)   BMI 20.35 kg/m  Pleasant female in no acute distress.  Alert and oriented. Abdomen: Soft, nontender Pelvic exam: External genitalia-left labia majora ecchymoses and ecchymosis of the mons pubis; no palpable masses or tenderness BUS-normal; previously noted edema is markedly reduced Vagina-surgical incision lines are well approximated and healing; excellent vault support; no palpable masses  PROCEDURE: PVR check-90 cc urine Leg bag catheter was previously removed.  Verbal consent was obtained.  After 60 minutes and multiple spontaneous voids of good urine stream (volume was not calculated), patient presents for PVR check.  The perineum is cleansed with Betadine.  16 French red Robinson catheter is used to drain the bladder of 90 cc of clear urine.  Procedure is well-tolerated.  ASSESSMENT: 1.  Resolution of urinary retention following TVH BSO with anterior/posterior colporrhaphy  PLAN: 1.  Leave catheter out 2.  Continue with postop precautions 3.  Return in 5 weeks for final postop check 4.  Discontinue Macrobid antibiotic  Brayton Mars, MD  Note: This dictation was prepared with Dragon dictation along with smaller phrase technology. Any transcriptional errors that result from this process are unintentional.

## 2018-02-20 NOTE — Patient Instructions (Signed)
1.  Postvoid residual today was 90 cc.  No further continuous bladder drainage with catheter is necessary. 2.  Recommend avoiding every 3-4 hours during the day 3.  Return in 5 weeks for final postop check 4.  Return if any problems develop including dysuria, frequency, urgency, or hematuria. 5.  No further antibiotic prophylaxis is necessary.

## 2018-02-26 NOTE — Telephone Encounter (Signed)
Spoke to pt who states she experienced a few more complications than expected with her surgery and is requesting a few more weeks before completing prolia injection. Note made to contact pt back

## 2018-03-19 ENCOUNTER — Other Ambulatory Visit (INDEPENDENT_AMBULATORY_CARE_PROVIDER_SITE_OTHER): Payer: Medicare Other

## 2018-03-19 DIAGNOSIS — M810 Age-related osteoporosis without current pathological fracture: Secondary | ICD-10-CM

## 2018-03-19 LAB — CALCIUM: Calcium: 9.9 mg/dL (ref 8.4–10.5)

## 2018-03-28 ENCOUNTER — Ambulatory Visit (INDEPENDENT_AMBULATORY_CARE_PROVIDER_SITE_OTHER): Payer: Medicare Other

## 2018-03-28 DIAGNOSIS — M810 Age-related osteoporosis without current pathological fracture: Secondary | ICD-10-CM

## 2018-03-28 MED ORDER — DENOSUMAB 60 MG/ML ~~LOC~~ SOSY
60.0000 mg | PREFILLED_SYRINGE | Freq: Once | SUBCUTANEOUS | Status: AC
  Administered 2018-03-28: 60 mg via SUBCUTANEOUS

## 2018-04-03 ENCOUNTER — Encounter: Payer: Self-pay | Admitting: Obstetrics and Gynecology

## 2018-04-03 ENCOUNTER — Encounter: Payer: Medicare Other | Admitting: Obstetrics and Gynecology

## 2018-04-03 ENCOUNTER — Ambulatory Visit (INDEPENDENT_AMBULATORY_CARE_PROVIDER_SITE_OTHER): Payer: Medicare Other | Admitting: Obstetrics and Gynecology

## 2018-04-03 VITALS — BP 152/82 | HR 73 | Ht 65.0 in | Wt 120.7 lb

## 2018-04-03 DIAGNOSIS — Z90710 Acquired absence of both cervix and uterus: Secondary | ICD-10-CM

## 2018-04-03 DIAGNOSIS — N898 Other specified noninflammatory disorders of vagina: Secondary | ICD-10-CM

## 2018-04-03 DIAGNOSIS — Z09 Encounter for follow-up examination after completed treatment for conditions other than malignant neoplasm: Secondary | ICD-10-CM

## 2018-04-03 NOTE — Patient Instructions (Signed)
1.  Resume activities as tolerated 2.  Continue taking stool softener once or twice daily in order to have regular daily bowel movements 3.  Continue water intake to stay hydrated 4.  Return in 3 months for follow-up on granulation tissue and vagina 5.  Recommend continuing Estrace cream 1/2 g intravaginal once or twice a week

## 2018-04-03 NOTE — Progress Notes (Signed)
Chief complaint: 1.  Final postop check 2.  Status post TVH BSO with anterior/posterior colporrhaphy 3.  History of urinary retention postoperatively  Christy [name] presents today for final postop check.  She underwent TVH BSO with anterior/posterior colporrhaphy on 02/12/2018.  Postoperatively she had urinary retention requiring leg bag catheterization; problem has since resolved.  She is voiding completely now but does continue to wear a panty liner for protection; she does report that she is not actively leaking.  Bowel function is markedly improved since surgery; she continues to use the stool softener twice a day.  She has not been using Estrace cream intravaginal as was done preoperatively.  John reports no significant abdominal pelvic pain.  OBJECTIVE: BP (!) 152/82   Pulse 73   Ht 5\' 5"  (1.651 m)   Wt 120 lb 11.2 oz (54.7 kg)   BMI 20.09 kg/m  Pleasant well-appearing female in no acute distress.  Alert and oriented. Back: No CVA tenderness Abdomen: Soft, nontender Bladder: Nontender Pelvic exam: External genitalia-atrophic changes present BUS-normal Vagina-good vaginal vault support; 3 x 5 mm granulation tissue is noted on posterior mid vagina along the posterior colporrhaphy suture line (nonfriable); vaginal cuff is intact with evidence of residual suture present. Bimanual-no palpable abnormalities or tenderness Rectovaginal-normal external exam  ASSESSMENT: 1.  Normal postop check status post TVH BSO with anterior/posterior colporrhaphy 2.  Posterior vaginal granulation tissue, asymptomatic 3.  Vaginal cuff with residual suture still present, nonfriable  PLAN: 1.  Resume all activities without restriction 2.  Recommend continuing stool softener for another 4 weeks, taking Colace once or twice daily 3.  Recommend restarting Estrace cream intravaginal 1/2 g once or twice a week 4.  Return in 3 months for follow-up on granulation tissue and vaginal cuff healing  Christy Mars,  MD  Note: This dictation was prepared with Dragon dictation along with smaller phrase technology. Any transcriptional errors that result from this process are unintentional.

## 2018-04-09 DIAGNOSIS — D2260 Melanocytic nevi of unspecified upper limb, including shoulder: Secondary | ICD-10-CM | POA: Diagnosis not present

## 2018-04-09 DIAGNOSIS — Z1283 Encounter for screening for malignant neoplasm of skin: Secondary | ICD-10-CM | POA: Diagnosis not present

## 2018-04-09 DIAGNOSIS — L219 Seborrheic dermatitis, unspecified: Secondary | ICD-10-CM | POA: Diagnosis not present

## 2018-04-09 DIAGNOSIS — D2230 Melanocytic nevi of unspecified part of face: Secondary | ICD-10-CM | POA: Diagnosis not present

## 2018-04-09 DIAGNOSIS — L812 Freckles: Secondary | ICD-10-CM | POA: Diagnosis not present

## 2018-04-09 DIAGNOSIS — L578 Other skin changes due to chronic exposure to nonionizing radiation: Secondary | ICD-10-CM | POA: Diagnosis not present

## 2018-04-09 DIAGNOSIS — D1800 Hemangioma unspecified site: Secondary | ICD-10-CM | POA: Diagnosis not present

## 2018-04-09 DIAGNOSIS — D225 Melanocytic nevi of trunk: Secondary | ICD-10-CM | POA: Diagnosis not present

## 2018-04-09 DIAGNOSIS — L820 Inflamed seborrheic keratosis: Secondary | ICD-10-CM | POA: Diagnosis not present

## 2018-04-09 DIAGNOSIS — L821 Other seborrheic keratosis: Secondary | ICD-10-CM | POA: Diagnosis not present

## 2018-06-15 ENCOUNTER — Other Ambulatory Visit: Payer: Self-pay | Admitting: Primary Care

## 2018-06-15 DIAGNOSIS — E785 Hyperlipidemia, unspecified: Secondary | ICD-10-CM

## 2018-06-15 DIAGNOSIS — Z862 Personal history of diseases of the blood and blood-forming organs and certain disorders involving the immune mechanism: Secondary | ICD-10-CM

## 2018-06-19 DIAGNOSIS — H401122 Primary open-angle glaucoma, left eye, moderate stage: Secondary | ICD-10-CM | POA: Diagnosis not present

## 2018-06-22 ENCOUNTER — Ambulatory Visit (INDEPENDENT_AMBULATORY_CARE_PROVIDER_SITE_OTHER): Payer: Medicare Other

## 2018-06-22 ENCOUNTER — Other Ambulatory Visit: Payer: Self-pay | Admitting: Primary Care

## 2018-06-22 VITALS — BP 124/78 | HR 76 | Temp 97.9°F | Ht 64.5 in | Wt 123.2 lb

## 2018-06-22 DIAGNOSIS — Z0000 Encounter for general adult medical examination without abnormal findings: Secondary | ICD-10-CM

## 2018-06-22 DIAGNOSIS — Z23 Encounter for immunization: Secondary | ICD-10-CM | POA: Diagnosis not present

## 2018-06-22 DIAGNOSIS — Z1231 Encounter for screening mammogram for malignant neoplasm of breast: Secondary | ICD-10-CM

## 2018-06-22 DIAGNOSIS — Z862 Personal history of diseases of the blood and blood-forming organs and certain disorders involving the immune mechanism: Secondary | ICD-10-CM | POA: Diagnosis not present

## 2018-06-22 DIAGNOSIS — E785 Hyperlipidemia, unspecified: Secondary | ICD-10-CM | POA: Diagnosis not present

## 2018-06-22 LAB — COMPREHENSIVE METABOLIC PANEL
ALBUMIN: 4.4 g/dL (ref 3.5–5.2)
ALT: 19 U/L (ref 0–35)
AST: 23 U/L (ref 0–37)
Alkaline Phosphatase: 26 U/L — ABNORMAL LOW (ref 39–117)
BILIRUBIN TOTAL: 0.8 mg/dL (ref 0.2–1.2)
BUN: 13 mg/dL (ref 6–23)
CALCIUM: 9.1 mg/dL (ref 8.4–10.5)
CO2: 32 mEq/L (ref 19–32)
CREATININE: 0.64 mg/dL (ref 0.40–1.20)
Chloride: 104 mEq/L (ref 96–112)
GFR: 96.52 mL/min (ref >60.00)
Glucose, Bld: 97 mg/dL (ref 70–99)
Potassium: 4.2 mEq/L (ref 3.5–5.1)
SODIUM: 142 meq/L (ref 135–145)
Total Protein: 6.6 g/dL (ref 6.0–8.3)

## 2018-06-22 LAB — CBC
HEMATOCRIT: 44.8 % (ref 36.0–46.0)
Hemoglobin: 15 g/dL (ref 12.0–15.0)
MCHC: 33.6 g/dL (ref 30.0–36.0)
MCV: 95.6 fl (ref 78.0–100.0)
Platelets: 190 10*3/uL (ref 150.0–400.0)
RBC: 4.69 Mil/uL (ref 3.87–5.11)
RDW: 13.8 % (ref 11.5–15.5)
WBC: 5.5 10*3/uL (ref 4.0–10.5)

## 2018-06-22 LAB — LIPID PANEL
Cholesterol: 200 mg/dL (ref 0–200)
HDL: 76.8 mg/dL (ref >39.00)
LDL CALC: 112 mg/dL — AB (ref 0–99)
NonHDL: 123.02
TRIGLYCERIDES: 57 mg/dL (ref 0.0–149.0)
Total CHOL/HDL Ratio: 3
VLDL: 11.4 mg/dL (ref 0.0–40.0)

## 2018-06-22 NOTE — Progress Notes (Signed)
Subjective:   Christy [name] is a 72 y.o. female who presents for Medicare Annual (Subsequent) preventive examination.  Review of Systems:  N/A Cardiac Risk Factors include: advanced age (>54men, >87 women)     Objective:     Vitals: BP 124/78 (BP Location: Right Arm, Patient Position: Sitting, Cuff Size: Normal)   Pulse 76   Temp 97.9 F (36.6 C) (Oral)   Ht 5' 4.5" (1.638 m) Comment: no shoes  Wt 123 lb 4 oz (55.9 kg)   SpO2 98%   BMI 20.83 kg/m   Body mass index is 20.83 kg/m.  Advanced Directives 06/22/2018 02/12/2018 02/05/2018 10/05/2016  Does Patient Have a Medical Advance Directive? Yes Yes Yes Yes  Type of Paramedic of [place];Living will [place];Living will - Living will  Does patient want to make changes to medical advance directive? - No - Patient declined - -  Copy of [place] in Chart? Yes Yes - -    Tobacco Social History   Tobacco Use  Smoking Status Never Smoker  Smokeless Tobacco Never Used     Counseling given: No   Clinical Intake:  Pre-visit preparation completed: Yes  Pain : No/denies pain Pain Score: 0-No pain     Nutritional Status: BMI of 19-24  Normal Nutritional Risks: None Diabetes: No  How often do you need to have someone help you when you read instructions, pamphlets, or other written materials from your doctor or pharmacy?: 1 - Never What is the last grade level you completed in school?: Bachelor degree  Interpreter Needed?: No  Comments: pt is a widow and lives at [place] 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 Arthritis   . Atrophy of vagina   . Cystocele   . GERD (gastroesophageal reflux disease)   . Glaucoma    left eye  . Heart murmur   . Rectocele   . Uveitis    left   Past Surgical History:  Procedure Laterality Date  . CATARACT EXTRACTION, BILATERAL    . EYE SURGERY Bilateral    lens  implant  . TUBAL LIGATION    . VAGINAL HYSTERECTOMY Bilateral 02/12/2018   Procedure: HYSTERECTOMY VAGINAL WITH BILATERAL SALPINGO OOPHERECTOMY;  Surgeon: Brayton Mars, MD;  Location: [facility] ORS;  Service: Gynecology;  Laterality: Bilateral;   Family History  Problem Relation Age of Onset  . Aortic stenosis Mother   . Heart disease Mother   . Osteoporosis Mother   . Cancer Father        prostate  . Heart disease Father   . Multiple sclerosis Daughter   . Breast cancer Maternal Aunt    Social History   Socioeconomic History  . Marital status: Widowed    Spouse name: Not on file  . Number of children: Not on file  . Years of education: Not on file  . Highest education level: Not on file  Occupational History  . Not on file  Social Needs  . Financial resource strain: Not on file  . Food insecurity:    Worry: Not on file    Inability: Not on file  . Transportation needs:    Medical: Not on file    Non-medical: Not on file  Tobacco Use  . Smoking status: Never Smoker  . Smokeless tobacco: Never Used  Substance and Sexual Activity  . Alcohol use: Yes    Alcohol/week: 7.0 standard drinks  Types: 7 Glasses of wine per week    Comment: wine daily  . Drug use: No  . Sexual activity: Not Currently    Birth control/protection: Surgical  Lifestyle  . Physical activity:    Days per week: Not on file    Minutes per session: Not on file  . Stress: Not on file  Relationships  . Social connections:    Talks on phone: Not on file    Gets together: Not on file    Attends religious service: Not on file    Active member of club or organization: Not on file    Attends meetings of clubs or organizations: Not on file    Relationship status: Not on file  Other Topics Concern  . Not on file  Social History Narrative   Recently moved to [facility] from [place].   She is primary caregiver for her husband who has Alzheimers.   Two children.      Retired Arts development officer.      Desires CPR.   Has living will- SCANNED INTO CHART   Would not want prolonged life support if futile.                   Outpatient Encounter Medications as of 06/22/2018  Medication Sig  . brimonidine (ALPHAGAN) 0.2 % ophthalmic solution Place 1 drop into the left eye 2 (two) times daily.   Marland Kitchen CALCIUM CARBONATE-VITAMIN D PO Take 1 tablet by mouth 2 (two) times daily.   . cholecalciferol (VITAMIN D) 1000 UNITS tablet Take 1,000 Units by mouth daily.  . Cranberry 500 MG CAPS Take 500 mg by mouth daily.   Marland Kitchen denosumab (PROLIA) 60 MG/ML SOLN injection Inject 60 mg into the skin every 6 (six) months. Administer in upper arm, thigh, or abdomen  . dorzolamide-timolol (COSOPT) 22.3-6.8 MG/ML ophthalmic solution Place 1 drop into the left eye every 12 (twelve) hours.  Marland Kitchen estradiol (ESTRACE) 0.1 MG/GM vaginal cream INSERT ONE-HALF GRAM  VAGINALLY TWICE WEEKLY  . GLUCOSAMINE CHONDROITIN COMPLX PO Take 1 tablet by mouth daily.   . Multiple Vitamin (MULTIVITAMIN) tablet Take 1 tablet by mouth daily.  . Omega-3 Fatty Acids (FISH OIL) 1200 MG CAPS Take 2,400 mg by mouth daily.   . [DISCONTINUED] acetaminophen (TYLENOL) 500 MG tablet Take 2 tablets (1,000 mg total) by mouth 4 (four) times daily.  . [DISCONTINUED] aspirin 81 MG tablet Take 81 mg by mouth daily.  . [DISCONTINUED] docusate sodium (COLACE) 100 MG capsule Take 1 capsule (100 mg total) by mouth 2 (two) times daily.   No facility-administered encounter medications on file as of 06/22/2018.     Activities of Daily Living In your present state of health, do you have any difficulty performing the following activities: 06/22/2018 02/12/2018  Hearing? N -  Vision? N -  Difficulty concentrating or making decisions? N -  Walking or climbing stairs? N -  Dressing or bathing? N -  Doing errands, shopping? N N  Preparing Food and eating ? N -  Using the Toilet? N -  In the past six months, have you accidently leaked urine? N -  Do you  have problems with loss of bowel control? N -  Managing your Medications? N -  Managing your Finances? N -  Housekeeping or managing your Housekeeping? N -  Some recent data might be hidden    Patient Care Team: Pleas Koch, NP as PCP - General (Internal Medicine) Leandrew Koyanagi, MD  as Referring Physician (Ophthalmology) Defrancesco, Alanda Slim, MD as Consulting Physician (Obstetrics and Gynecology)    Assessment:   This is a routine wellness examination for Sandrika.   Hearing Screening   125Hz  250Hz  500Hz  1000Hz  2000Hz  3000Hz  4000Hz  6000Hz  8000Hz   Right ear:   40 40 40  40    Left ear:   40 40 40  40    Vision Screening Comments: Vision exam on 06/19/18 with Dr. Wallace Going    Exercise Activities and Dietary recommendations Current Exercise Habits: Home exercise routine, Type of exercise: walking, Time (Minutes): 20, Frequency (Times/Week): 7, Weekly Exercise (Minutes/Week): 140, Intensity: Mild, Exercise limited by: None identified  Goals    . Increase physical activity     Starting 06/22/2018, I will continue to walk at least 1 mile daily.        Fall Risk Fall Risk  06/22/2018 06/09/2016 06/09/2015 06/03/2014 05/06/2013  Falls in the past year? 0 No No No No   Depression Screen PHQ 2/9 Scores 06/22/2018 06/09/2016 06/09/2015 06/03/2014  PHQ - 2 Score 0 0 0 0  PHQ- 9 Score 0 - - -     Cognitive Function MMSE - Mini Mental State Exam 06/22/2018  Orientation to time 5  Orientation to Place 5  Registration 3  Attention/ Calculation 0  Recall 3  Language- name 2 objects 0  Language- repeat 1  Language- follow 3 step command 3  Language- read & follow direction 0  Write a sentence 0  Copy design 0  Total score 20     PLEASE NOTE: A Mini-Cog screen was completed. Maximum score is 20. A value of 0 denotes this part of Folstein MMSE was not completed or the patient failed this part of the Mini-Cog screening.   Mini-Cog Screening Orientation to Time - Max 5  pts Orientation to Place - Max 5 pts Registration - Max 3 pts Recall - Max 3 pts Language Repeat - Max 1 pts Language Follow 3 Step Command - Max 3 pts     Immunization History  Administered Date(s) Administered  . Influenza, Seasonal, Injecte, Preservative Fre 06/29/2006, 07/04/2007, 06/24/2008, 05/28/2009, 06/14/2010, 06/03/2011, 05/28/2012  . Influenza,inj,Quad PF,6+ Mos 05/06/2013, 05/20/2014, 06/09/2015, 06/15/2017, 06/22/2018  . Pneumococcal Conjugate-13 07/10/2014  . Pneumococcal Polysaccharide-23 01/05/2010  . Pneumococcal-Unspecified 01/05/2010  . Td 08/22/1996, 12/21/2006, 07/26/2017  . Tdap 12/21/2006  . Zoster 02/24/2006  . Zoster Recombinat (Shingrix) 03/29/2018    Screening Tests Health Maintenance  Topic Date Due  . MAMMOGRAM  08/21/2018 (Originally 06/01/2018)  . COLONOSCOPY  01/15/2019  . TETANUS/TDAP  07/27/2027  . INFLUENZA VACCINE  Completed  . DEXA SCAN  Completed  . Hepatitis C Screening  Completed  . PNA vac Low Risk Adult  Completed      Plan:     I have personally reviewed, addressed, and noted the following in the patient's chart:  A. Medical and social history B. Use of alcohol, tobacco or illicit drugs  C. Current medications and supplements D. Functional ability and status E.  Nutritional status F.  Physical activity G. Advance directives H. List of other physicians I.  Hospitalizations, surgeries, and ER visits in previous 12 months J.  [place] to include hearing, vision, cognitive, depression L. Referrals and appointments - none  In addition, I have reviewed and discussed with patient certain preventive protocols, quality metrics, and best practice recommendations. A written personalized care plan for preventive services as well as general preventive health recommendations were provided to patient.  See attached scanned questionnaire for additional information.   Signed,   Lindell Noe, MHA, BS, LPN [facility]

## 2018-06-22 NOTE — Progress Notes (Signed)
PCP notes:   Health maintenance:  Mammogram - PCP follow-up needed Flu vaccine - administered  Abnormal screenings:   None  Patient concerns:   Patient reports concern with increased frequency of hoarseness.  Nurse concerns:  None  Next PCP appt:   07/06/18 @ 1140

## 2018-06-22 NOTE — Patient Instructions (Signed)
Ms. Poudrier , Thank you for taking time to come for your Medicare Wellness Visit. I appreciate your ongoing commitment to your health goals. Please review the following plan we discussed and let me know if I can assist you in the future.   These are the goals we discussed: Goals    . Increase physical activity     Starting 06/22/2018, I will continue to walk at least 1 mile daily.        This is a list of the screening recommended for you and due dates:  Health Maintenance  Topic Date Due  . Mammogram  08/21/2018*  . Colon Cancer Screening  01/15/2019  . Tetanus Vaccine  07/27/2027  . Flu Shot  Completed  . DEXA scan (bone density measurement)  Completed  .  Hepatitis C: One time screening is recommended by Center for Disease Control  (CDC) for  adults born from [birth year] through 1966.   Completed  . Pneumonia vaccines  Completed  *Topic was postponed. The date shown is not the original due date.   Preventive Care for Adults  A healthy lifestyle and preventive care can promote health and wellness. Preventive health guidelines for adults include the following key practices.  . A routine yearly physical is a good way to check with your health care provider about your health and preventive screening. It is a chance to share any concerns and updates on your health and to receive a thorough exam.  . Visit your dentist for a routine exam and preventive care every 6 months. Brush your teeth twice a day and floss once a day. Good oral hygiene prevents tooth decay and gum disease.  . The frequency of eye exams is based on your age, health, family medical history, use  of contact lenses, and other factors. Follow your health care provider's recommendations for frequency of eye exams.  . Eat a healthy diet. Foods like vegetables, fruits, whole grains, low-fat dairy products, and lean protein foods contain the nutrients you need without too many calories. Decrease your intake of foods high in  solid fats, added sugars, and salt. Eat the right amount of calories for you. Get information about a proper diet from your health care provider, if necessary.  . Regular physical exercise is one of the most important things you can do for your health. Most adults should get at least 150 minutes of moderate-intensity exercise (any activity that increases your heart rate and causes you to sweat) each week. In addition, most adults need muscle-strengthening exercises on 2 or more days a week.  Silver Sneakers may be a benefit available to you. To determine eligibility, you may visit the website: www.silversneakers.com or contact program at 684 199 5274 Mon-Fri between 8AM-8PM.   . Maintain a healthy weight. The body mass index (BMI) is a screening tool to identify possible weight problems. It provides an estimate of body fat based on height and weight. Your health care provider can find your BMI and can help you achieve or maintain a healthy weight.   For adults 20 years and older: ? A BMI below 18.5 is considered underweight. ? A BMI of 18.5 to 24.9 is normal. ? A BMI of 25 to 29.9 is considered overweight. ? A BMI of 30 and above is considered obese.   . Maintain normal blood lipids and cholesterol levels by exercising and minimizing your intake of saturated fat. Eat a balanced diet with plenty of fruit and vegetables. Blood tests for lipids and cholesterol  should begin at age 64 and be repeated every 5 years. If your lipid or cholesterol levels are high, you are over 50, or you are at high risk for heart disease, you may need your cholesterol levels checked more frequently. Ongoing high lipid and cholesterol levels should be treated with medicines if diet and exercise are not working.  . If you smoke, find out from your health care provider how to quit. If you do not use tobacco, please do not start.  . If you choose to drink alcohol, please do not consume more than 2 drinks per day. One drink  is considered to be 12 ounces (355 mL) of beer, 5 ounces (148 mL) of wine, or 1.5 ounces (44 mL) of liquor.  . If you are 38-47 years old, ask your health care provider if you should take aspirin to prevent strokes.  . Use sunscreen. Apply sunscreen liberally and repeatedly throughout the day. You should seek shade when your shadow is shorter than you. Protect yourself by wearing long sleeves, pants, a wide-brimmed hat, and sunglasses year round, whenever you are outdoors.  . Once a month, do a whole body skin exam, using a mirror to look at the skin on your back. Tell your health care provider of new moles, moles that have irregular borders, moles that are larger than a pencil eraser, or moles that have changed in shape or color.

## 2018-06-25 ENCOUNTER — Ambulatory Visit
Admission: RE | Admit: 2018-06-25 | Discharge: 2018-06-25 | Disposition: A | Discharge status: Home / Self Care | Payer: Medicare Other | Source: Ambulatory Visit | Attending: Primary Care | Admitting: Primary Care

## 2018-06-25 DIAGNOSIS — Z1231 Encounter for screening mammogram for malignant neoplasm of breast: Secondary | ICD-10-CM | POA: Insufficient documentation

## 2018-06-29 ENCOUNTER — Encounter: Payer: Medicare Other | Admitting: Primary Care

## 2018-07-04 ENCOUNTER — Ambulatory Visit (INDEPENDENT_AMBULATORY_CARE_PROVIDER_SITE_OTHER): Payer: Medicare Other | Admitting: Obstetrics and Gynecology

## 2018-07-04 ENCOUNTER — Encounter: Payer: Self-pay | Admitting: Obstetrics and Gynecology

## 2018-07-04 VITALS — BP 125/77 | Ht 65.0 in | Wt 122.7 lb

## 2018-07-04 DIAGNOSIS — N952 Postmenopausal atrophic vaginitis: Secondary | ICD-10-CM

## 2018-07-04 DIAGNOSIS — N898 Other specified noninflammatory disorders of vagina: Secondary | ICD-10-CM | POA: Diagnosis not present

## 2018-07-04 DIAGNOSIS — Z90710 Acquired absence of both cervix and uterus: Secondary | ICD-10-CM

## 2018-07-04 NOTE — Patient Instructions (Signed)
1.  Continue Estrace cream intravaginal 1/2 g twice weekly 2.  Return in 1 year for annual gynecologic Medicare physical with Dr. Amalia Hailey

## 2018-07-04 NOTE — Progress Notes (Signed)
Chief complaint: 1.  Granulation tissue 2.  Status post TVH BSO with anterior/posterior colporrhaphy 3.  Vaginal atrophy  Christy [name] presents for follow-up.  She had residual granulation tissue at last postop check following her TVH BSO with anterior/posterior colporrhaphy's.  At this time she is not experiencing any pelvic pain.  She is not experiencing any vaginal discharge or vaginal bleeding. Bowel function is normal. Bladder function is normal. She has been using the Estrace cream intravaginal to help with healing and maintenance of her prolapse repair. Christy [name] is also on Prolia for osteoporosis-currently year for of treatment.  Past medical history, past surgical history, problem list, medications, and allergies are reviewed  OBJECTIVE: BP 125/77   Ht 5\' 5"  (1.651 m)   Wt 122 lb 11.2 oz (55.7 kg)   BMI 20.42 kg/m  Pleasant well-appearing elderly female no acute distress.  Alert and oriented. Abdomen: Soft, nontender Pelvic exam: External genitalia-normal BUS-small urethral caruncle Vagina-excellent support; mild to moderate atrophy; vaginal cuff is intact, demonstrating a 3 mm residual area of granulation tissue, nonfriable. Cervix-surgically absent Uterus-surgically absent Bimanual-no palpable masses or tenderness Rectovaginal-normal external exam Extremities-warm and dry  ASSESSMENT: 1.  Vaginal granulation tissue, resolved 2.  Moderate vaginal atrophy 3.  Status post TVH BSO with anterior/posterior colporrhaphy  PLAN: 1.  Continue using Estrace cream intravaginal 1/2 to 1 g twice weekly 2.  Return in 1 year for follow-up Medicare physical with Dr. Amalia Hailey 3.  Follow-up as needed for any gynecologic issues  A total of 15 minutes were spent face-to-face with the patient during this encounter and over half of that time dealt with counseling and coordination of care.  Brayton Mars, MD  Note: This dictation was prepared with Dragon dictation along with smaller phrase  technology. Any transcriptional errors that result from this process are unintentional.

## 2018-07-06 ENCOUNTER — Ambulatory Visit (INDEPENDENT_AMBULATORY_CARE_PROVIDER_SITE_OTHER): Payer: Medicare Other | Admitting: Primary Care

## 2018-07-06 ENCOUNTER — Encounter: Payer: Self-pay | Admitting: Primary Care

## 2018-07-06 DIAGNOSIS — K219 Gastro-esophageal reflux disease without esophagitis: Secondary | ICD-10-CM

## 2018-07-06 DIAGNOSIS — Z0000 Encounter for general adult medical examination without abnormal findings: Secondary | ICD-10-CM | POA: Insufficient documentation

## 2018-07-06 DIAGNOSIS — H4089 Other specified glaucoma: Secondary | ICD-10-CM | POA: Diagnosis not present

## 2018-07-06 DIAGNOSIS — M810 Age-related osteoporosis without current pathological fracture: Secondary | ICD-10-CM

## 2018-07-06 DIAGNOSIS — N952 Postmenopausal atrophic vaginitis: Secondary | ICD-10-CM | POA: Diagnosis not present

## 2018-07-06 DIAGNOSIS — Z90710 Acquired absence of both cervix and uterus: Secondary | ICD-10-CM | POA: Diagnosis not present

## 2018-07-06 NOTE — Assessment & Plan Note (Signed)
Compliant to semiannual Prolia injections.  Recent calcium level stable.  Continue Prolia, oral calcium, vitamin D.  Repeat bone density due in 2021.

## 2018-07-06 NOTE — Assessment & Plan Note (Signed)
Overall doing well, recent follow-up with GYN.

## 2018-07-06 NOTE — Assessment & Plan Note (Signed)
Immunizations up-to-date. Colonoscopy up-to-date, due in spring 2020. Mammogram up-to-date. Encouraged a healthy diet and regular weightbearing exercise. Exam unremarkable. Labs reviewed.

## 2018-07-06 NOTE — Assessment & Plan Note (Signed)
Suspect hoarseness of voice secondary to silent reflux.  We will have her start on a 2 to 4-week trial of omeprazole 20 mg daily.  She will update in a few weeks.  Due for repeat endoscopy in early 2020.

## 2018-07-06 NOTE — Patient Instructions (Addendum)
Start omeprazole 20 mg once daily for 2-4 weeks. Please update me if you continue to notice voice hoarseness.  Make sure to eat a healthy diet and get regular weight bearing exercise.  Ensure you are consuming 64 ounces of water daily.  Continue Prolia injections.  Continue using estrace cream twice weekly as prescribed.  We will see you in one year for your annual exam or sooner if needed.   It was a pleasure to see you today!

## 2018-07-06 NOTE — Assessment & Plan Note (Signed)
Compliant to Estrace cream twice weekly.  Continue same.

## 2018-07-06 NOTE — Progress Notes (Signed)
Subjective:    Patient ID: Christy [name], female    DOB: 27-Jan-1946, 72 y.o.   MRN: [id number]  HPI  [name] is a 72 year old female who presents today for [place] Part 2. She saw our health advisor.   1) Osteoporosis: Currently managed on Prolia injections with her last injection being on 03/28/18. Compliant to calcium and vitamin D.    2) Vaginal Atrophy/Granulation Tissue of Vagina: Status post TVHBSO with anterior/posterior colporrhaphy. Following with GYN. She is using Estrace cream vaginally twice weekly on average.   3) Glaucoma: Located to the left eye. Currently managed on Alphagan and Cosopt. Following with ophthalmology.   4) GERD: Long history. Silent reflux for the last 10 years. Once on omeprazole, then ranitidine. Currently not taking medications. She's noticed voice hoarseness over the last 2-3 years. Endoscopy last completed in 2010 and was told that she had irritation to the esophagus and stomach. She was also evaluated by ENT years ago for a mucous plug that she coughs up once to twice daily.  She was told that her symptoms were related to GERD.    She denies esophageal burning, belching, epigastric pain, nausea.  Immunizations: -Tetanus: Completed in 2018 -Influenza: Completed this season -Pneumonia: Completed in 2015 -Shingles: Completed 2 doses of Shingrix in 2019  Eye exam: Completed in October 2019 Colonoscopy: Completed in 2010, due in 2020 Dexa: Completed in January 2019 Mammogram: Completed in 2019 Hep C Screen: Completed    Review of Systems  Constitutional: Negative for unexpected weight change.  HENT: Negative for rhinorrhea.   Respiratory: Negative for cough and shortness of breath.   Cardiovascular: Negative for chest pain.  Gastrointestinal: Negative for constipation and diarrhea.       Hoarse voice  Genitourinary: Negative for difficulty urinating.  Musculoskeletal: Positive for arthralgias.  Skin: Negative for rash.    Allergic/Immunologic: Negative for environmental allergies.  Neurological: Negative for dizziness, numbness and headaches.  Psychiatric/Behavioral: The patient is not nervous/anxious.        Past Medical History:  Diagnosis Date  . Arthritis   . Atrophy of vagina   . Cystocele   . GERD (gastroesophageal reflux disease)   . Glaucoma    left eye  . Heart murmur   . Rectocele   . Uveitis    left     Social History   Socioeconomic History  . Marital status: Widowed    Spouse name: Not on file  . Number of children: Not on file  . Years of education: Not on file  . Highest education level: Not on file  Occupational History  . Not on file  Social Needs  . Financial resource strain: Not on file  . Food insecurity:    Worry: Not on file    Inability: Not on file  . Transportation needs:    Medical: Not on file    Non-medical: Not on file  Tobacco Use  . Smoking status: Never Smoker  . Smokeless tobacco: Never Used  Substance and Sexual Activity  . Alcohol use: Yes    Alcohol/week: 7.0 standard drinks    Types: 7 Glasses of wine per week    Comment: wine daily  . Drug use: No  . Sexual activity: Not Currently    Birth control/protection: Surgical  Lifestyle  . Physical activity:    Days per week: Not on file    Minutes per session: Not on file  . Stress: Not on file  Relationships  .  Social connections:    Talks on phone: Not on file    Gets together: Not on file    Attends religious service: Not on file    Active member of club or organization: Not on file    Attends meetings of clubs or organizations: Not on file    Relationship status: Not on file  . Intimate partner violence:    Fear of current or ex partner: Not on file    Emotionally abused: Not on file    Physically abused: Not on file    Forced sexual activity: Not on file  Other Topics Concern  . Not on file  Social History Narrative   Recently moved to [facility] from [place].   She is  primary caregiver for her husband who has Alzheimers.   Two children.      Retired Engineer, production.      Desires CPR.   Has living will- SCANNED INTO CHART   Would not want prolonged life support if futile.                   Past Surgical History:  Procedure Laterality Date  . CATARACT EXTRACTION, BILATERAL    . EYE SURGERY Bilateral    lens implant  . TUBAL LIGATION    . VAGINAL HYSTERECTOMY Bilateral 02/12/2018   Procedure: HYSTERECTOMY VAGINAL WITH BILATERAL SALPINGO OOPHERECTOMY;  Surgeon: Brayton Mars, MD;  Location: [facility] ORS;  Service: Gynecology;  Laterality: Bilateral;    Family History  Problem Relation Age of Onset  . Aortic stenosis Mother   . Heart disease Mother   . Osteoporosis Mother   . Cancer Father        prostate  . Heart disease Father   . Multiple sclerosis Daughter   . Breast cancer Maternal Aunt     Allergies  Allergen Reactions  . Xalatan [Latanoprost] Itching, Swelling and Other (See Comments)    Caused eye to be red, swollen,itchy    Current Outpatient Medications on File Prior to Visit  Medication Sig Dispense Refill  . brimonidine (ALPHAGAN) 0.2 % ophthalmic solution Place 1 drop into the left eye 2 (two) times daily.     Marland Kitchen CALCIUM CARBONATE-VITAMIN D PO Take 1 tablet by mouth 2 (two) times daily.     . cholecalciferol (VITAMIN D) 1000 UNITS tablet Take 1,000 Units by mouth daily.    . Cranberry 500 MG CAPS Take 500 mg by mouth daily.     Marland Kitchen denosumab (PROLIA) 60 MG/ML SOLN injection Inject 60 mg into the skin every 6 (six) months. Administer in upper arm, thigh, or abdomen    . dorzolamide-timolol (COSOPT) 22.3-6.8 MG/ML ophthalmic solution Place 1 drop into the left eye every 12 (twelve) hours.    Marland Kitchen estradiol (ESTRACE) 0.1 MG/GM vaginal cream INSERT ONE-HALF GRAM  VAGINALLY TWICE WEEKLY 42.5 g 3  . GLUCOSAMINE CHONDROITIN COMPLX PO Take 1 tablet by mouth daily.     . Multiple Vitamin (MULTIVITAMIN) tablet Take 1 tablet by  mouth daily.    . Omega-3 Fatty Acids (FISH OIL) 1200 MG CAPS Take 2,400 mg by mouth daily.      No current facility-administered medications on file prior to visit.     BP 124/78   Pulse 78   Temp 98.5 F (36.9 C) (Oral)   Ht 5' 4.5" (1.638 m)   Wt 123 lb 8 oz (56 kg)   SpO2 98%   BMI 20.87 kg/m    Objective:  Physical Exam  Constitutional: She is oriented to person, place, and time. She appears well-nourished.  HENT:  Mouth/Throat: No oropharyngeal exudate.  Eyes: Pupils are equal, round, and reactive to light. EOM are normal.  Neck: Neck supple. No thyromegaly present.  Cardiovascular: Normal rate and regular rhythm.  Respiratory: Effort normal and breath sounds normal.  GI: Soft. Bowel sounds are normal. There is no tenderness.  Musculoskeletal: Normal range of motion.  Neurological: She is alert and oriented to person, place, and time.  Skin: Skin is warm and dry.  Psychiatric: She has a normal mood and affect.           Assessment & Plan:

## 2018-07-06 NOTE — Assessment & Plan Note (Signed)
Following with ophthalmology.  Continue same.

## 2018-07-17 ENCOUNTER — Encounter: Payer: Medicare Other | Admitting: Primary Care

## 2018-07-24 ENCOUNTER — Encounter: Payer: Medicare Other | Admitting: Primary Care

## 2018-09-03 ENCOUNTER — Telehealth: Payer: Self-pay | Admitting: *Deleted

## 2018-09-03 NOTE — Telephone Encounter (Signed)
Information has been submitted to pts insurance for verification of benefits. Awaiting response for coverage  

## 2018-09-25 ENCOUNTER — Other Ambulatory Visit (INDEPENDENT_AMBULATORY_CARE_PROVIDER_SITE_OTHER): Payer: Medicare Other

## 2018-09-25 DIAGNOSIS — M810 Age-related osteoporosis without current pathological fracture: Secondary | ICD-10-CM

## 2018-09-25 LAB — CALCIUM: CALCIUM: 9.3 mg/dL (ref 8.4–10.5)

## 2018-09-25 NOTE — Telephone Encounter (Signed)
Verification of benefits have been processed and an approval has been received for pts prolia injection. Pts estimated cost are appx $0. This is only an estimate and cannot be confirmed until benefits are paid. Please advise pt and schedule if needed. If scheduled, once the injection is received, pls contact me back with the date it was received so that I am able to update prolia folder. thanks   Lm on pts vm requesting a call back

## 2018-10-03 ENCOUNTER — Ambulatory Visit (INDEPENDENT_AMBULATORY_CARE_PROVIDER_SITE_OTHER): Payer: Medicare Other

## 2018-10-03 DIAGNOSIS — M810 Age-related osteoporosis without current pathological fracture: Secondary | ICD-10-CM

## 2018-10-03 MED ORDER — DENOSUMAB 60 MG/ML ~~LOC~~ SOSY
60.0000 mg | PREFILLED_SYRINGE | Freq: Once | SUBCUTANEOUS | Status: AC
  Administered 2018-10-03: 60 mg via SUBCUTANEOUS

## 2018-10-24 NOTE — Progress Notes (Signed)
Approved.  

## 2018-11-21 ENCOUNTER — Other Ambulatory Visit: Payer: Self-pay | Admitting: Primary Care

## 2018-11-21 ENCOUNTER — Telehealth: Payer: Self-pay | Admitting: Primary Care

## 2018-11-21 DIAGNOSIS — Z1211 Encounter for screening for malignant neoplasm of colon: Secondary | ICD-10-CM

## 2018-11-21 DIAGNOSIS — K219 Gastro-esophageal reflux disease without esophagitis: Secondary | ICD-10-CM

## 2018-11-21 NOTE — Telephone Encounter (Addendum)
-----   Message from Pleas Koch, NP sent at 07/06/2018 12:21 PM EST ----- Regarding: Colonoscopy/Endoscopy Please notify patient that she will be due for repeat colonoscopy and endoscopy in late May/early June this year. I've gone ahead and placed the referral for her to have this done later this Summer.

## 2018-11-22 NOTE — Telephone Encounter (Signed)
Spoken and notified patient of Kate Clark's comments. Patient verbalized understanding.  

## 2018-12-03 ENCOUNTER — Telehealth: Payer: Self-pay | Admitting: Primary Care

## 2018-12-03 NOTE — Telephone Encounter (Signed)
Best number [phone]  Pt wanted you to call her back about gi referral

## 2018-12-11 ENCOUNTER — Other Ambulatory Visit: Payer: Self-pay

## 2018-12-11 ENCOUNTER — Encounter: Payer: Self-pay | Admitting: Gastroenterology

## 2018-12-11 ENCOUNTER — Ambulatory Visit (INDEPENDENT_AMBULATORY_CARE_PROVIDER_SITE_OTHER): Payer: Medicare Other | Admitting: Gastroenterology

## 2018-12-11 DIAGNOSIS — R4702 Dysphasia: Secondary | ICD-10-CM

## 2018-12-11 DIAGNOSIS — K219 Gastro-esophageal reflux disease without esophagitis: Secondary | ICD-10-CM | POA: Diagnosis not present

## 2018-12-11 NOTE — Progress Notes (Signed)
Christy Lame, MD [address]  [place]  [place], [place] [address]  Main: [phone]  Fax: [phone]    Gastroenterology Virtual/Video Visit  Referring Provider:     Pleas Koch, NP Primary Care Physician:  Pleas Koch, NP Primary Gastroenterologist:  Dr.Jeremie Giangrande Allen Norris Reason for Consultation:     GERD        HPI:    Virtual Visit via Video Note Location of the patient: Home Location of provider: Office  Participating persons: The patient myself and Christy [name].  I connected with Christy [name] on 12/11/18 at  9:30 AM EDT by a video enabled telemedicine application and verified that I am speaking with the correct person using two identifiers.   I discussed the limitations of evaluation and management by telemedicine and the availability of in person appointments. The patient expressed understanding and agreed to proceed.  Verbal consent to proceed obtained.  History of Present Illness: Christy [name] is a 73 y.o. female referred by Dr. Carlis Abbott, Leticia Penna, NP  for consultation & management of GERD and a need for a screening colonoscopy.  This patient had an EGD and colonoscopy back in 2010 with 2 small polyps found during the colonoscopy of which the pathology is not readily available.  The patient was told at that time that she needs a repeat colonoscopy in 10 years.  The patient also had an EGD at that time that showed her to have esophagitis and gastritis.  She was seen by her primary care provider in November 2019 and was reporting some hoarseness at that time.  The patient was started on a PPI thinking that she may be having some silent reflux.  The patient was then contacted on April 1 by her primary care provider's office stating that she was due for her screening colonoscopy and upper endoscopy due to her history of reflux. Her hoarseness has been for the last 2-3 years and worse at end of day.  The patient reports that she had only one episode  where food got stuck in the esophagus and she had a bring it back up which happened approximately 1 month ago.  She also reports that she was seen by ENT a year before her last colonoscopy which would make it somewhere around 2009 and was told that she had some irritation of her vocal cords.  There is no report of any unexplained weight loss black stools or bloody stools.  Past Medical History:  Diagnosis Date  . Arthritis   . Atrophy of vagina   . Cystocele   . GERD (gastroesophageal reflux disease)   . Glaucoma    left eye  . Heart murmur   . Rectocele   . Uveitis    left    Past Surgical History:  Procedure Laterality Date  . CATARACT EXTRACTION, BILATERAL    . EYE SURGERY Bilateral    lens implant  . TUBAL LIGATION    . VAGINAL HYSTERECTOMY Bilateral 02/12/2018   Procedure: HYSTERECTOMY VAGINAL WITH BILATERAL SALPINGO OOPHERECTOMY;  Surgeon: Brayton Mars, MD;  Location: [facility] ORS;  Service: Gynecology;  Laterality: Bilateral;    Prior to Admission medications   Medication Sig Start Date End Date Taking? Authorizing Provider  brimonidine (ALPHAGAN) 0.2 % ophthalmic solution Place 1 drop into the left eye 2 (two) times daily.     [provider]  CALCIUM CARBONATE-VITAMIN D PO Take 1 tablet by mouth 2 (two) times daily.  [provider]  cholecalciferol (VITAMIN D) 1000 UNITS tablet Take 1,000 Units by mouth daily.    [provider]  Cranberry 500 MG CAPS Take 500 mg by mouth daily.     [provider]  denosumab (PROLIA) 60 MG/ML SOLN injection Inject 60 mg into the skin every 6 (six) months. Administer in upper arm, thigh, or abdomen    [provider]  dorzolamide-timolol (COSOPT) 22.3-6.8 MG/ML ophthalmic solution Place 1 drop into the left eye every 12 (twelve) hours.    [provider]  estradiol (ESTRACE) 0.1 MG/GM vaginal cream INSERT ONE-HALF GRAM  VAGINALLY TWICE WEEKLY 06/29/17   Defrancesco, Alanda Slim, MD   GLUCOSAMINE CHONDROITIN COMPLX PO Take 1 tablet by mouth daily.     [provider]  Multiple Vitamin (MULTIVITAMIN) tablet Take 1 tablet by mouth daily.    [provider]  Omega-3 Fatty Acids (FISH OIL) 1200 MG CAPS Take 2,400 mg by mouth daily.     [provider]    Family History  Problem Relation Age of Onset  . Aortic stenosis Mother   . Heart disease Mother   . Osteoporosis Mother   . Cancer Father        prostate  . Heart disease Father   . Multiple sclerosis Daughter   . Breast cancer Maternal Aunt      Social History   Tobacco Use  . Smoking status: Never Smoker  . Smokeless tobacco: Never Used  Substance Use Topics  . Alcohol use: Yes    Alcohol/week: 7.0 standard drinks    Types: 7 Glasses of wine per week    Comment: wine daily  . Drug use: No    Allergies as of 12/11/2018 - Review Complete 07/06/2018  Allergen Reaction Noted  . Xalatan [latanoprost] Itching, Swelling, and Other (See Comments) 05/06/2013    Review of Systems:    All systems reviewed and negative except where noted in HPI.   Observations/Objective:  Labs: CBC    Component Value Date/Time   WBC 5.5 06/22/2018 1047   RBC 4.69 06/22/2018 1047   HGB 15.0 06/22/2018 1047   HCT 44.8 06/22/2018 1047   PLT 190.0 06/22/2018 1047   MCV 95.6 06/22/2018 1047   MCH 33.0 02/05/2018 0838   MCHC 33.6 06/22/2018 1047   RDW 13.8 06/22/2018 1047   LYMPHSABS 1.4 02/05/2018 0838   MONOABS 0.8 02/05/2018 0838   EOSABS 0.2 02/05/2018 0838   BASOSABS 0.1 02/05/2018 0838   CMP     Component Value Date/Time   NA 142 06/22/2018 1047   K 4.2 06/22/2018 1047   CL 104 06/22/2018 1047   CO2 32 06/22/2018 1047   GLUCOSE 97 06/22/2018 1047   BUN 13 06/22/2018 1047   CREATININE 0.64 06/22/2018 1047   CALCIUM 9.3 09/25/2018 1455   PROT 6.6 06/22/2018 1047   ALBUMIN 4.4 06/22/2018 1047   AST 23 06/22/2018 1047   ALT 19 06/22/2018 1047   ALKPHOS 26 (L) 06/22/2018 1047    BILITOT 0.8 06/22/2018 1047   GFRNONAA >60 10/05/2016 2149   GFRAA >60 10/05/2016 2149    Imaging Studies: No results found.  Assessment and Plan:   Christy [name] is a 73 y.o. y/o female has been referred for reflux with some dysphasia and need for a colonoscopy.  The patient is due for her screening colonoscopy.  The patient has been found to have osteoporosis and is reluctant to be on any acid suppression.  The patient  had been on omeprazole in the past but did not notice any difference and was switched to Zantac but stopped that.  She presently is doing well without any heartburn symptoms although she does report the hoarseness at the mid to end of the day.  She also states that she feels like mucus comes up her esophagus.  The patient will be set up for an EGD and colonoscopy.  The colonoscopy will be for screening purposes and the EGD will be done for her reflux symptoms and dysphagia.  Follow Up Instructions:  I discussed the assessment and treatment plan with the patient. The patient was provided an opportunity to ask questions and all were answered. The patient agreed with the plan and demonstrated an understanding of the instructions.   The patient was advised to call back or seek an in-person evaluation if the symptoms worsen or if the condition fails to improve as anticipated.  I provided 20 minutes of non-face-to-face time during this encounter.   Christy Lame, MD  Speech recognition software was used to dictate the above note.

## 2018-12-26 DIAGNOSIS — H401122 Primary open-angle glaucoma, left eye, moderate stage: Secondary | ICD-10-CM | POA: Diagnosis not present

## 2018-12-27 IMAGING — MG DIGITAL SCREENING BILATERAL MAMMOGRAM WITH TOMO AND CAD
6 of 12 series · 6 of 36 positions shown · non-contrast
Comparison: Previous exam(s).

CLINICAL DATA: Screening.

EXAM:
DIGITAL SCREENING BILATERAL MAMMOGRAM WITH TOMO AND CAD

[L MLO synth-2D (1 of 2)]
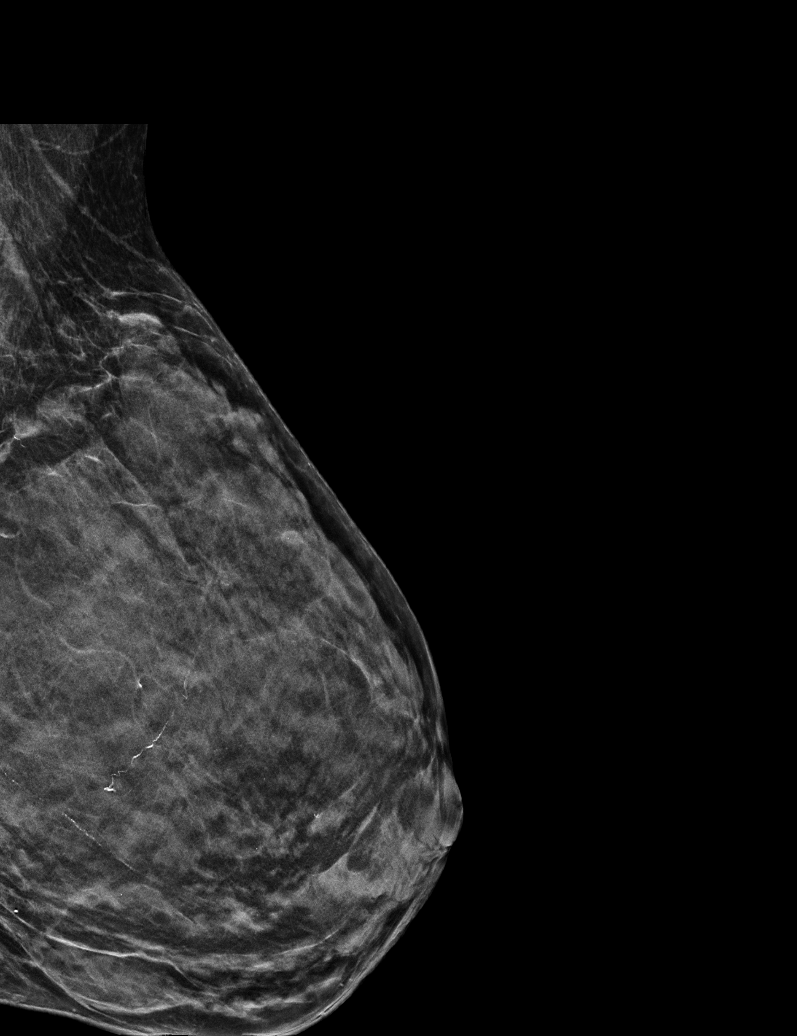

[R CC synth-2D]
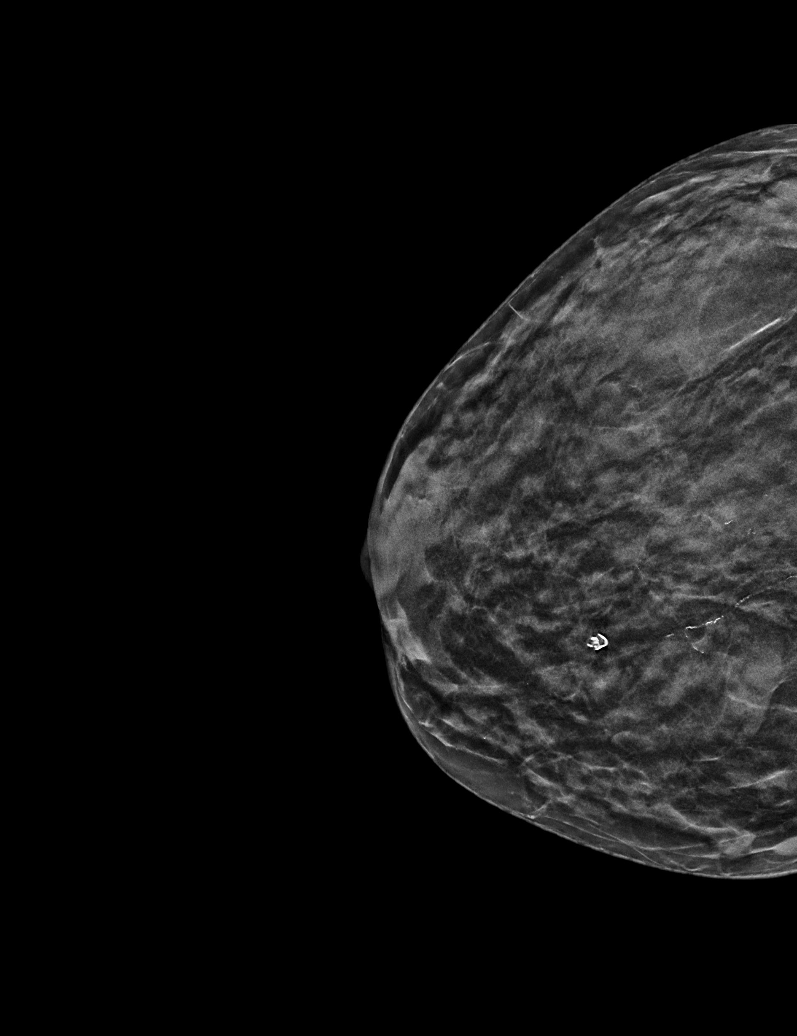

[L MLO synth-2D (2 of 2)]
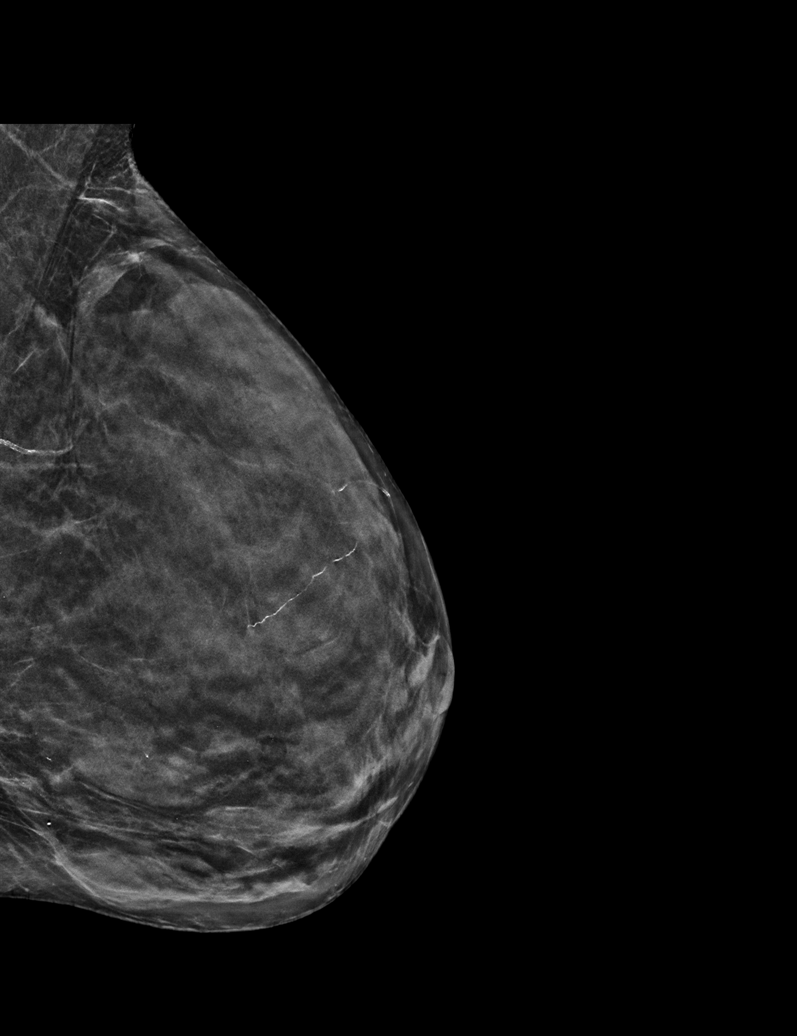

[L CC synth-2D]
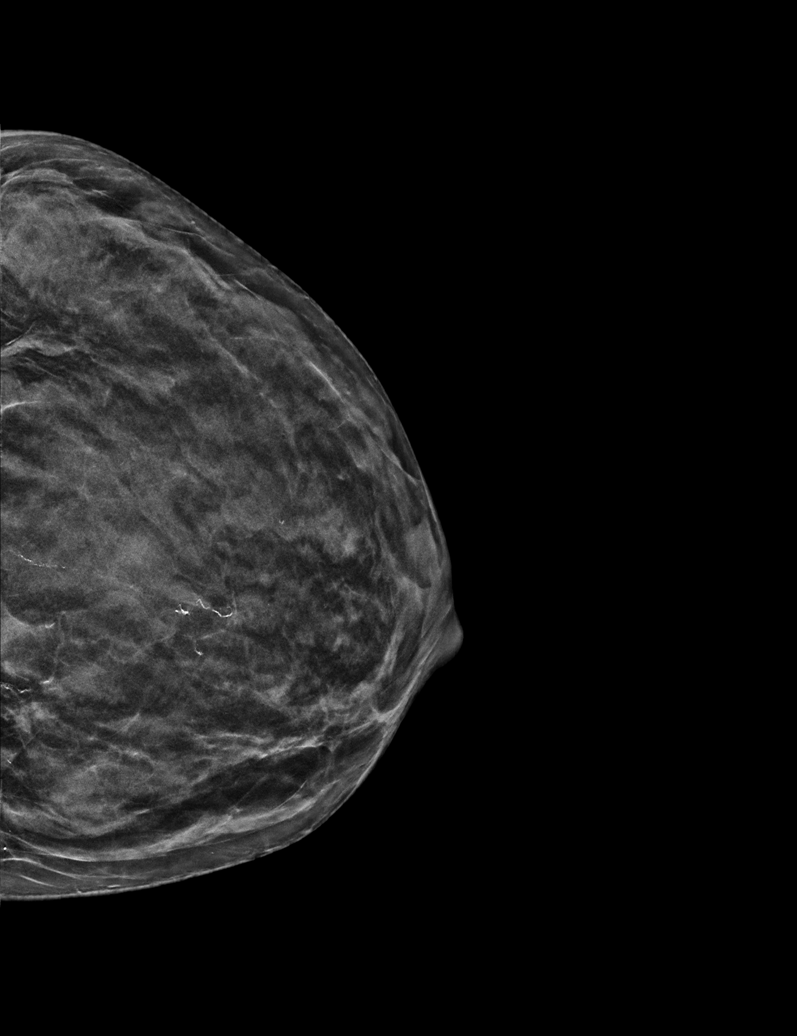

[R MLO synth-2D (1 of 2)]
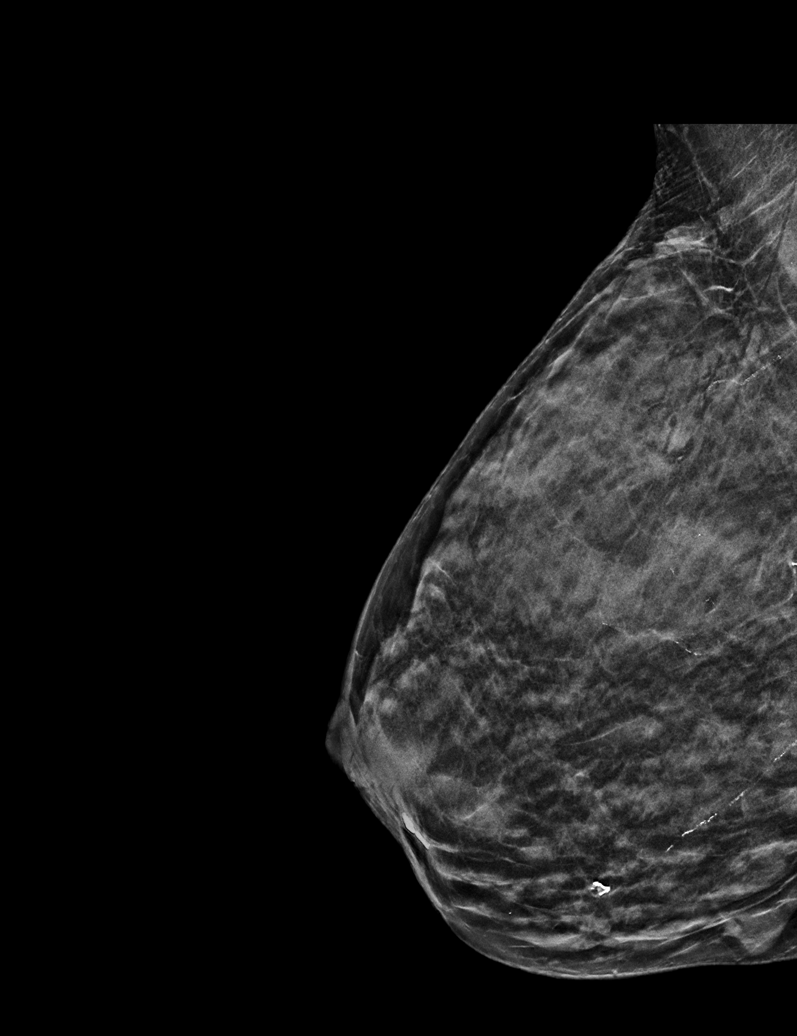

[R MLO synth-2D (2 of 2)]
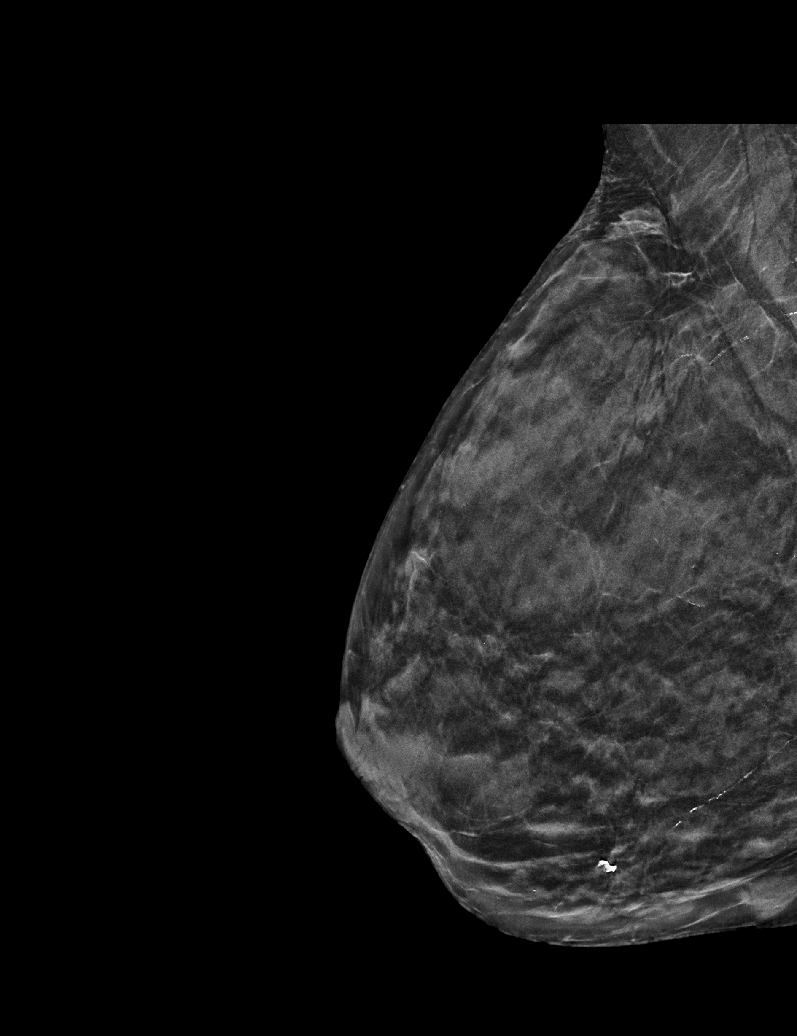

[6 of 36 positions shown; findings below may reference images not displayed]

ACR Breast Density Category d: The breast tissue is extremely dense,
which lowers the sensitivity of mammography.
FINDINGS: There are no findings suspicious for malignancy. Images were
processed with CAD.
IMPRESSION: No mammographic evidence of malignancy. A result letter of this
screening mammogram will be mailed directly to the patient.

RECOMMENDATION:
Screening mammogram in one year. (Code:RA-I-AVB)

BI-RADS CATEGORY  1: Negative.

## 2019-01-08 DIAGNOSIS — N952 Postmenopausal atrophic vaginitis: Secondary | ICD-10-CM

## 2019-01-08 MED ORDER — ESTRADIOL 0.1 MG/GM VA CREA: TOPICAL_CREAM | VAGINAL | 0 refills | Status: DC

## 2019-01-09 ENCOUNTER — Ambulatory Visit: Payer: Medicare Other

## 2019-01-23 ENCOUNTER — Other Ambulatory Visit: Payer: Self-pay

## 2019-01-23 DIAGNOSIS — Z1211 Encounter for screening for malignant neoplasm of colon: Secondary | ICD-10-CM

## 2019-01-23 MED ORDER — NA SULFATE-K SULFATE-MG SULF 17.5-3.13-1.6 GM/177ML PO SOLN: 1.0000 | Freq: Once | ORAL | 0 refills | Status: AC

## 2019-01-29 ENCOUNTER — Other Ambulatory Visit: Payer: Self-pay

## 2019-01-29 DIAGNOSIS — Z1211 Encounter for screening for malignant neoplasm of colon: Secondary | ICD-10-CM

## 2019-01-29 DIAGNOSIS — K219 Gastro-esophageal reflux disease without esophagitis: Secondary | ICD-10-CM

## 2019-01-29 DIAGNOSIS — R4702 Dysphasia: Secondary | ICD-10-CM

## 2019-02-05 ENCOUNTER — Telehealth: Payer: Self-pay | Admitting: Gastroenterology

## 2019-02-05 NOTE — Telephone Encounter (Signed)
Patient called & l/m on v/m stating she is scheduled for a colonoscopy next Tuesday & has questions about her clear liquid diet.

## 2019-02-06 NOTE — Telephone Encounter (Signed)
Contacted pt and answered questions regarding drinking prep solution and clear liquid diet. All questions answered.

## 2019-02-08 ENCOUNTER — Other Ambulatory Visit: Payer: Self-pay

## 2019-02-08 ENCOUNTER — Other Ambulatory Visit
Admission: RE | Admit: 2019-02-08 | Discharge: 2019-02-08 | Disposition: A | Discharge status: Home / Self Care | Payer: Medicare Other | Source: Ambulatory Visit | Attending: Gastroenterology | Admitting: Gastroenterology

## 2019-02-08 DIAGNOSIS — Z1159 Encounter for screening for other viral diseases: Secondary | ICD-10-CM | POA: Insufficient documentation

## 2019-02-09 LAB — NOVEL CORONAVIRUS, NAA (HOSP ORDER, SEND-OUT TO REF LAB; TAT 18-24 HRS): SARS-CoV-2, NAA: NOT DETECTED

## 2019-02-12 ENCOUNTER — Encounter
Admission: RE | Disposition: A | Discharge status: Home / Self Care | Payer: Self-pay | Source: Home / Self Care | Attending: Gastroenterology

## 2019-02-12 ENCOUNTER — Ambulatory Visit
Admission: RE | Admit: 2019-02-12 | Discharge: 2019-02-12 | Disposition: A | Discharge status: Home / Self Care | Payer: Medicare Other | Attending: Gastroenterology | Admitting: Gastroenterology

## 2019-02-12 ENCOUNTER — Encounter: Payer: Self-pay | Admitting: *Deleted

## 2019-02-12 ENCOUNTER — Other Ambulatory Visit: Payer: Self-pay

## 2019-02-12 ENCOUNTER — Ambulatory Visit: Payer: Medicare Other | Admitting: Certified Registered"

## 2019-02-12 DIAGNOSIS — Z820 Family history of epilepsy and other diseases of the nervous system: Secondary | ICD-10-CM | POA: Insufficient documentation

## 2019-02-12 DIAGNOSIS — K219 Gastro-esophageal reflux disease without esophagitis: Secondary | ICD-10-CM | POA: Diagnosis not present

## 2019-02-12 DIAGNOSIS — K5730 Diverticulosis of large intestine without perforation or abscess without bleeding: Secondary | ICD-10-CM | POA: Diagnosis not present

## 2019-02-12 DIAGNOSIS — Z9842 Cataract extraction status, left eye: Secondary | ICD-10-CM | POA: Insufficient documentation

## 2019-02-12 DIAGNOSIS — Z803 Family history of malignant neoplasm of breast: Secondary | ICD-10-CM | POA: Insufficient documentation

## 2019-02-12 DIAGNOSIS — Z79899 Other long term (current) drug therapy: Secondary | ICD-10-CM | POA: Diagnosis not present

## 2019-02-12 DIAGNOSIS — Z8262 Family history of osteoporosis: Secondary | ICD-10-CM | POA: Diagnosis not present

## 2019-02-12 DIAGNOSIS — H409 Unspecified glaucoma: Secondary | ICD-10-CM | POA: Insufficient documentation

## 2019-02-12 DIAGNOSIS — M1990 Unspecified osteoarthritis, unspecified site: Secondary | ICD-10-CM | POA: Insufficient documentation

## 2019-02-12 DIAGNOSIS — Z8042 Family history of malignant neoplasm of prostate: Secondary | ICD-10-CM | POA: Insufficient documentation

## 2019-02-12 DIAGNOSIS — Z9841 Cataract extraction status, right eye: Secondary | ICD-10-CM | POA: Diagnosis not present

## 2019-02-12 DIAGNOSIS — F458 Other somatoform disorders: Secondary | ICD-10-CM | POA: Diagnosis not present

## 2019-02-12 DIAGNOSIS — K449 Diaphragmatic hernia without obstruction or gangrene: Secondary | ICD-10-CM | POA: Insufficient documentation

## 2019-02-12 DIAGNOSIS — R4702 Dysphasia: Secondary | ICD-10-CM

## 2019-02-12 DIAGNOSIS — R011 Cardiac murmur, unspecified: Secondary | ICD-10-CM | POA: Insufficient documentation

## 2019-02-12 DIAGNOSIS — Z8249 Family history of ischemic heart disease and other diseases of the circulatory system: Secondary | ICD-10-CM | POA: Diagnosis not present

## 2019-02-12 DIAGNOSIS — Z1211 Encounter for screening for malignant neoplasm of colon: Secondary | ICD-10-CM | POA: Diagnosis not present

## 2019-02-12 DIAGNOSIS — Z888 Allergy status to other drugs, medicaments and biological substances status: Secondary | ICD-10-CM | POA: Diagnosis not present

## 2019-02-12 DIAGNOSIS — K641 Second degree hemorrhoids: Secondary | ICD-10-CM | POA: Diagnosis not present

## 2019-02-12 DIAGNOSIS — Z90710 Acquired absence of both cervix and uterus: Secondary | ICD-10-CM | POA: Insufficient documentation

## 2019-02-12 DIAGNOSIS — R130 Aphagia: Secondary | ICD-10-CM | POA: Diagnosis not present

## 2019-02-12 HISTORY — PX: ESOPHAGOGASTRODUODENOSCOPY (EGD) WITH PROPOFOL: SHX5813

## 2019-02-12 HISTORY — PX: COLONOSCOPY WITH PROPOFOL: SHX5780

## 2019-02-12 SURGERY — COLONOSCOPY WITH PROPOFOL
Anesthesia: General

## 2019-02-12 MED ORDER — GLYCOPYRROLATE 0.2 MG/ML IJ SOLN
INTRAMUSCULAR | Status: DC | PRN
  Administered 2019-02-12: 0.2 mg via INTRAVENOUS

## 2019-02-12 MED ORDER — PROPOFOL 10 MG/ML IV BOLUS
INTRAVENOUS | Status: DC | PRN
  Administered 2019-02-12: 20 mg via INTRAVENOUS
  Administered 2019-02-12: 50 mg via INTRAVENOUS
  Administered 2019-02-12 (×7): 20 mg via INTRAVENOUS

## 2019-02-12 MED ORDER — SODIUM CHLORIDE 0.9 % IV SOLN
INTRAVENOUS | Status: DC
  Administered 2019-02-12: 08:00:00 via INTRAVENOUS

## 2019-02-12 MED ORDER — LIDOCAINE HCL (CARDIAC) PF 100 MG/5ML IV SOSY
PREFILLED_SYRINGE | INTRAVENOUS | Status: DC | PRN
  Administered 2019-02-12: 60 mg via INTRATRACHEAL

## 2019-02-12 NOTE — Transfer of Care (Signed)
Immediate Anesthesia Transfer of Care Note  Patient: Christy [name]  Procedure(s) Performed: COLONOSCOPY WITH PROPOFOL (N/A ) ESOPHAGOGASTRODUODENOSCOPY (EGD) WITH PROPOFOL (N/A )  Patient Location: Endoscopy Unit  Anesthesia Type:General  Level of Consciousness: awake, patient cooperative and responds to stimulation  Airway & Oxygen Therapy: Patient Spontanous Breathing and Patient connected to face mask oxygen  Post-op Assessment: Report given to RN and Post -op Vital signs reviewed and stable  Post vital signs: Reviewed and stable  Last Vitals:  Vitals Value Taken Time  BP 107/68 02/12/19 0849  Temp 36.1 C 02/12/19 0848  Pulse 82 02/12/19 0851  Resp 13 02/12/19 0851  SpO2 100 % 02/12/19 0851  Vitals shown include unvalidated device data.  Last Pain:  Vitals:   02/12/19 0848  TempSrc: Tympanic  PainSc: Asleep         Complications: No apparent anesthesia complications

## 2019-02-12 NOTE — Anesthesia Preprocedure Evaluation (Addendum)
Anesthesia Evaluation  Patient identified by MRN, date of birth, ID band Patient awake    Reviewed: Allergy & Precautions, H&P , NPO status , Patient's Chart, lab work & pertinent test results  Airway Mallampati: I  TM Distance: >3 FB Neck ROM: full    Dental  (+) Teeth Intact   Pulmonary neg pulmonary ROS, neg shortness of breath, neg sleep apnea, neg COPD, neg recent URI,           Cardiovascular (-) hypertension(-) angina(-) Past MI and (-) Cardiac Stents (-) dysrhythmias      Neuro/Psych negative neurological ROS  negative psych ROS   GI/Hepatic Neg liver ROS, GERD  ,  Endo/Other  negative endocrine ROS  Renal/GU negative Renal ROS  negative genitourinary   Musculoskeletal  (+) Arthritis ,   Abdominal   Peds  Hematology negative hematology ROS (+)   Anesthesia Other Findings Past Medical History: No date: Arthritis No date: Atrophy of vagina No date: Cystocele No date: GERD (gastroesophageal reflux disease) No date: Glaucoma     Comment:  left eye No date: Heart murmur No date: Rectocele No date: Uveitis     Comment:  left  Past Surgical History: No date: CATARACT EXTRACTION, BILATERAL No date: EYE SURGERY; Bilateral     Comment:  lens implant No date: TUBAL LIGATION 02/12/2018: VAGINAL HYSTERECTOMY; Bilateral     Comment:  Procedure: HYSTERECTOMY VAGINAL WITH BILATERAL SALPINGO               OOPHERECTOMY;  Surgeon: Brayton Mars, MD;                Location: [facility] ORS;  Service: Gynecology;  Laterality:               Bilateral;     Reproductive/Obstetrics negative OB ROS                            Anesthesia Physical Anesthesia Plan  ASA: II  Anesthesia Plan: General   Post-op Pain Management:    Induction:   PONV Risk Score and Plan: Propofol infusion and TIVA  Airway Management Planned: Natural Airway and Nasal Cannula  Additional Equipment:    Intra-op Plan:   Post-operative Plan:   Informed Consent: I have reviewed the patients History and Physical, chart, labs and discussed the procedure including the risks, benefits and alternatives for the proposed anesthesia with the patient or authorized representative who has indicated his/her understanding and acceptance.     Dental Advisory Given  Plan Discussed with: Anesthesiologist and CRNA  Anesthesia Plan Comments:         Anesthesia Quick Evaluation

## 2019-02-12 NOTE — Anesthesia Post-op Follow-up Note (Signed)
Anesthesia QCDR form completed.        

## 2019-02-12 NOTE — Op Note (Signed)
[facility] Gastroenterology Patient Name: Christy [name] Procedure Date: 02/12/2019 8:21 AM MRN: [id number] Account #: [id number] Date of Birth: 03/14/1946 Admit Type: Outpatient Age: 73 Room: [facility] ENDO ROOM 4 Gender: Female Note Status: Finalized Procedure:            Colonoscopy Indications:          Screening for colorectal malignant neoplasm Providers:            Lucilla Lame MD, MD Referring MD:         Pleas Koch (Referring MD) Medicines:            Propofol per Anesthesia Complications:        No immediate complications. Procedure:            Pre-Anesthesia Assessment:                       - Prior to the procedure, a History and Physical was                        performed, and patient medications and allergies were                        reviewed. The patient's tolerance of previous                        anesthesia was also reviewed. The risks and benefits of                        the procedure and the sedation options and risks were                        discussed with the patient. All questions were                        answered, and informed consent was obtained. Prior                        Anticoagulants: The patient has taken no previous                        anticoagulant or antiplatelet agents. ASA Grade                        Assessment: II - A patient with mild systemic disease.                        After reviewing the risks and benefits, the patient was                        deemed in satisfactory condition to undergo the                        procedure.                       After obtaining informed consent, the colonoscope was                        passed under direct vision. Throughout the procedure,  the patient's blood pressure, pulse, and oxygen                        saturations were monitored continuously. The                        Colonoscope was introduced through the anus and                         advanced to the the cecum, identified by appendiceal                        orifice and ileocecal valve. The colonoscopy was                        performed without difficulty. The patient tolerated the                        procedure well. The quality of the bowel preparation                        was excellent. Findings:      The perianal and digital rectal examinations were normal.      Multiple small-mouthed diverticula were found in the sigmoid colon.      Non-bleeding internal hemorrhoids were found during retroflexion. The       hemorrhoids were Grade II (internal hemorrhoids that prolapse but reduce       spontaneously). Impression:           - Diverticulosis in the sigmoid colon.                       - Non-bleeding internal hemorrhoids.                       - No specimens collected. Recommendation:       - Discharge patient to home.                       - Resume previous diet.                       - Continue present medications. Procedure Code(s):    --- Professional ---                       [phone], Colonoscopy, flexible; diagnostic, including                        collection of specimen(s) by brushing or washing, when                        performed (separate procedure) Diagnosis Code(s):    --- Professional ---                       Z12.11, Encounter for screening for malignant neoplasm                        of colon CPT copyright 2019 [employer] Medical Association. All rights reserved. The codes documented in this report are preliminary and upon coder review may  be revised to meet current compliance requirements. Lucilla Lame MD, MD 02/12/2019 8:46:38  AM This report has been signed electronically. Number of Addenda: 0 Note Initiated On: 02/12/2019 8:21 AM Scope Withdrawal Time: 0 hours 7 minutes 12 seconds  Total Procedure Duration: 0 hours 14 minutes 6 seconds  Estimated Blood Loss: Estimated blood loss: none.      [facility]

## 2019-02-12 NOTE — Op Note (Signed)
[facility] Patient Name: Christy [name] Procedure Date: 02/12/2019 8:22 AM MRN: [id number] Account #: [id number] Date of Birth: 03-30-1946 Admit Type: Outpatient Age: 73 Room: [facility] ENDO ROOM 4 Gender: Female Note Status: Finalized Procedure:            Upper GI endoscopy Indications:          Globus sensation Providers:            Lucilla Lame MD, MD Referring MD:         Pleas Koch (Referring MD) Medicines:            Propofol per Anesthesia Complications:        No immediate complications. Procedure:            Pre-Anesthesia Assessment:                       - Prior to the procedure, a History and Physical was                        performed, and patient medications and allergies were                        reviewed. The patient's tolerance of previous                        anesthesia was also reviewed. The risks and benefits of                        the procedure and the sedation options and risks were                        discussed with the patient. All questions were                        answered, and informed consent was obtained. Prior                        Anticoagulants: The patient has taken no previous                        anticoagulant or antiplatelet agents. ASA Grade                        Assessment: II - A patient with mild systemic disease.                        After reviewing the risks and benefits, the patient was                        deemed in satisfactory condition to undergo the                        procedure.                       After obtaining informed consent, the endoscope was                        passed under direct vision. Throughout the procedure,  the patient's blood pressure, pulse, and oxygen                        saturations were monitored continuously. The Endoscope                        was introduced through the mouth, and advanced to the     second part of duodenum. The upper GI endoscopy was                        accomplished without difficulty. The patient tolerated                        the procedure well. Findings:      A small hiatal hernia was present.      Two biopsies were obtained with cold forceps for histology in the middle       third of the esophagus.      The stomach was normal.      The examined duodenum was normal. Impression:           - Small hiatal hernia.                       - Normal stomach.                       - Normal examined duodenum.                       - Two biopsies were obtained in the middle third of the                        esophagus. Recommendation:       - Discharge patient to home.                       - Resume previous diet.                       - Continue present medications.                       - Await pathology results.                       - Perform a colonoscopy today. Procedure Code(s):    --- Professional ---                       [phone], Esophagogastroduodenoscopy, flexible, transoral;                        with biopsy, single or multiple Diagnosis Code(s):    --- Professional ---                       F45.8, Other somatoform disorders CPT copyright 2019 American Medical Association. All rights reserved. The codes documented in this report are preliminary and upon coder review may  be revised to meet current compliance requirements. Lucilla Lame MD, MD 02/12/2019 8:30:16 AM This report has been signed electronically. Number of Addenda: 0 Note Initiated On: 02/12/2019 8:22 AM Estimated Blood Loss: Estimated blood loss: none.      [facility]

## 2019-02-12 NOTE — H&P (Signed)
Christy Lame, MD [facility] [address] [place], [place] [address] Phone:[phone] Fax : [phone]  Primary Care Physician:  Pleas Koch, NP Primary Gastroenterologist:  Dr. Allen Norris  Pre-Procedure History & Physical: HPI:  Christy [name] is a 73 y.o. female is here for an endoscopy and colonoscopy.   Past Medical History:  Diagnosis Date  . Arthritis   . Atrophy of vagina   . Cystocele   . GERD (gastroesophageal reflux disease)   . Glaucoma    left eye  . Heart murmur   . Rectocele   . Uveitis    left    Past Surgical History:  Procedure Laterality Date  . CATARACT EXTRACTION, BILATERAL    . EYE SURGERY Bilateral    lens implant  . TUBAL LIGATION    . VAGINAL HYSTERECTOMY Bilateral 02/12/2018   Procedure: HYSTERECTOMY VAGINAL WITH BILATERAL SALPINGO OOPHERECTOMY;  Surgeon: Brayton Mars, MD;  Location: [facility] ORS;  Service: Gynecology;  Laterality: Bilateral;    Prior to Admission medications   Medication Sig Start Date End Date Taking? Authorizing Provider  brimonidine (ALPHAGAN) 0.2 % ophthalmic solution Place 1 drop into the left eye 2 (two) times daily.     [provider]  CALCIUM CARBONATE-VITAMIN D PO Take 1 tablet by mouth 2 (two) times daily.     [provider]  cholecalciferol (VITAMIN D) 1000 UNITS tablet Take 1,000 Units by mouth daily.    [provider]  Cranberry 500 MG CAPS Take 500 mg by mouth daily.     [provider]  denosumab (PROLIA) 60 MG/ML SOLN injection Inject 60 mg into the skin every 6 (six) months. Administer in upper arm, thigh, or abdomen    [provider]  dorzolamide-timolol (COSOPT) 22.3-6.8 MG/ML ophthalmic solution Place 1 drop into the left eye every 12 (twelve) hours.    [provider]  estradiol (ESTRACE) 0.1 MG/GM vaginal cream INSERT ONE-HALF GRAM  VAGINALLY TWICE WEEKLY 01/08/19   Pleas Koch, NP  GLUCOSAMINE CHONDROITIN COMPLX PO Take 1 tablet  by mouth daily.     [provider]  Multiple Vitamin (MULTIVITAMIN) tablet Take 1 tablet by mouth daily.    [provider]  Omega-3 Fatty Acids (FISH OIL) 1200 MG CAPS Take 2,400 mg by mouth daily.     [provider]  SUPREP BOWEL PREP KIT 17.5-3.13-1.6 GM/177ML SOLN See admin instructions. 01/23/19   [provider]    Allergies as of 01/29/2019 - Review Complete 12/11/2018  Allergen Reaction Noted  . Xalatan [latanoprost] Itching, Swelling, and Other (See Comments) 05/06/2013    Family History  Problem Relation Age of Onset  . Aortic stenosis Mother   . Heart disease Mother   . Osteoporosis Mother   . Cancer Father        prostate  . Heart disease Father   . Multiple sclerosis Daughter   . Breast cancer Maternal Aunt     Social History   Socioeconomic History  . Marital status: Widowed    Spouse name: Not on file  . Number of children: Not on file  . Years of education: Not on file  . Highest education level: Not on file  Occupational History  . Not on file  Social Needs  . Financial resource strain: Not on file  . Food insecurity    Worry: Not on file    Inability: Not on file  . Transportation needs    Medical: Not on file  Non-medical: Not on file  Tobacco Use  . Smoking status: Never Smoker  . Smokeless tobacco: Never Used  Substance and Sexual Activity  . Alcohol use: Yes    Alcohol/week: 7.0 standard drinks    Types: 7 Glasses of wine per week    Comment: wine daily  . Drug use: No  . Sexual activity: Not Currently    Birth control/protection: Surgical  Lifestyle  . Physical activity    Days per week: Not on file    Minutes per session: Not on file  . Stress: Not on file  Relationships  . Social Herbalist on phone: Not on file    Gets together: Not on file    Attends religious service: Not on file    Active member of club or organization: Not on file    Attends meetings of clubs or organizations:  Not on file    Relationship status: Not on file  . Intimate partner violence    Fear of current or ex partner: Not on file    Emotionally abused: Not on file    Physically abused: Not on file    Forced sexual activity: Not on file  Other Topics Concern  . Not on file  Social History Narrative   Recently moved to [facility] from [place].   She is primary caregiver for her husband who has Alzheimers.   Two children.      Retired Engineer, production.      Desires CPR.   Has living will- SCANNED INTO CHART   Would not want prolonged life support if futile.                   Review of Systems: See HPI, otherwise negative ROS  Physical Exam: There were no vitals taken for this visit. General:   Alert,  pleasant and cooperative in NAD Head:  Normocephalic and atraumatic. Neck:  Supple; no masses or thyromegaly. Lungs:  Clear throughout to auscultation.    Heart:  Regular rate and rhythm. Abdomen:  Soft, nontender and nondistended. Normal bowel sounds, without guarding, and without rebound.   Neurologic:  Alert and  oriented x4;  grossly normal neurologically.  Impression/Plan: [name] is here for an endoscopy and colonoscopy to be performed for dysphagia and screening.  Risks, benefits, limitations, and alternatives regarding  endoscopy and colonoscopy have been reviewed with the patient.  Questions have been answered.  All parties agreeable.   Christy Lame, MD  02/12/2019, 7:37 AM

## 2019-02-13 ENCOUNTER — Encounter: Payer: Self-pay | Admitting: Gastroenterology

## 2019-02-13 LAB — SURGICAL PATHOLOGY

## 2019-02-13 NOTE — Anesthesia Postprocedure Evaluation (Signed)
Anesthesia Post Note  Patient: Christy [name]  Procedure(s) Performed: COLONOSCOPY WITH PROPOFOL (N/A ) ESOPHAGOGASTRODUODENOSCOPY (EGD) WITH PROPOFOL (N/A )  Patient location during evaluation: PACU Anesthesia Type: General Level of consciousness: awake and alert Pain management: pain level controlled Vital Signs Assessment: post-procedure vital signs reviewed and stable Respiratory status: spontaneous breathing, nonlabored ventilation and respiratory function stable Cardiovascular status: blood pressure returned to baseline and stable Postop Assessment: no apparent nausea or vomiting Anesthetic complications: no     Last Vitals:  Vitals:   02/12/19 0908 02/12/19 0918  BP: 103/67 109/82  Pulse:    Resp:    Temp:    SpO2:      Last Pain:  Vitals:   02/12/19 0918  TempSrc:   PainSc: 0-No pain                 Durenda Hurt

## 2019-02-19 ENCOUNTER — Telehealth: Payer: Self-pay

## 2019-02-19 NOTE — Telephone Encounter (Signed)
LVM for pt to return my call to discuss pathology results.

## 2019-02-19 NOTE — Telephone Encounter (Signed)
-----   Message from Lucilla Lame, MD sent at 02/14/2019  5:48 PM EDT ----- Please let the patient know that the biopsies of her esophagus did not show any signs of reflux or damage.  With her fullness in her throat I would like her to see ENT.

## 2019-02-20 ENCOUNTER — Telehealth: Payer: Self-pay

## 2019-02-20 ENCOUNTER — Other Ambulatory Visit: Payer: Self-pay

## 2019-02-20 NOTE — Telephone Encounter (Signed)
Pt notified and referred to [facility] ENT.

## 2019-02-20 NOTE — Telephone Encounter (Signed)
Pt notified of pathology results. She has been referred to [facility] ENT.

## 2019-02-20 NOTE — Telephone Encounter (Signed)
Pt is calling for Ginger  She will be on a conference call until 3:30-4  You may call her after 4 pm today.

## 2019-02-20 NOTE — Telephone Encounter (Signed)
-----   Message from Lucilla Lame, MD sent at 02/14/2019  5:48 PM EDT ----- Please let the patient know that the biopsies of her esophagus did not show any signs of reflux or damage.  With her fullness in her throat I would like her to see ENT.

## 2019-02-25 NOTE — Progress Notes (Signed)
I reviewed health advisor's note, was available for consultation, and agree with documentation and plan.  

## 2019-02-28 ENCOUNTER — Telehealth: Payer: Self-pay | Admitting: Primary Care

## 2019-02-28 NOTE — Telephone Encounter (Signed)
Prolia benefits submitted. °

## 2019-03-21 DIAGNOSIS — R490 Dysphonia: Secondary | ICD-10-CM | POA: Diagnosis not present

## 2019-03-21 DIAGNOSIS — K219 Gastro-esophageal reflux disease without esophagitis: Secondary | ICD-10-CM | POA: Diagnosis not present

## 2019-03-22 ENCOUNTER — Telehealth: Payer: Self-pay | Admitting: Primary Care

## 2019-03-22 NOTE — Telephone Encounter (Signed)
Discussed Prolia benefits w/pt.  Pt has Medicare and supplement.  Pt would owe $0.  Pt understands.

## 2019-04-04 ENCOUNTER — Ambulatory Visit (INDEPENDENT_AMBULATORY_CARE_PROVIDER_SITE_OTHER): Payer: Medicare Other

## 2019-04-04 DIAGNOSIS — M810 Age-related osteoporosis without current pathological fracture: Secondary | ICD-10-CM | POA: Diagnosis not present

## 2019-04-04 MED ORDER — DENOSUMAB 60 MG/ML ~~LOC~~ SOSY
60.0000 mg | PREFILLED_SYRINGE | Freq: Once | SUBCUTANEOUS | Status: AC
  Administered 2019-04-04: 15:00:00 60 mg via SUBCUTANEOUS

## 2019-04-04 NOTE — Progress Notes (Signed)
Per orders of Katherine Clark,NP, injection of Prolia given by Takeya Marquis. Patient tolerated injection well.  

## 2019-05-03 DIAGNOSIS — K219 Gastro-esophageal reflux disease without esophagitis: Secondary | ICD-10-CM | POA: Diagnosis not present

## 2019-05-21 ENCOUNTER — Ambulatory Visit (INDEPENDENT_AMBULATORY_CARE_PROVIDER_SITE_OTHER): Payer: Medicare Other

## 2019-05-21 DIAGNOSIS — Z23 Encounter for immunization: Secondary | ICD-10-CM | POA: Diagnosis not present

## 2019-05-31 ENCOUNTER — Other Ambulatory Visit: Payer: Self-pay | Admitting: Primary Care

## 2019-05-31 DIAGNOSIS — Z1231 Encounter for screening mammogram for malignant neoplasm of breast: Secondary | ICD-10-CM

## 2019-06-06 DIAGNOSIS — L821 Other seborrheic keratosis: Secondary | ICD-10-CM | POA: Diagnosis not present

## 2019-06-06 DIAGNOSIS — L814 Other melanin hyperpigmentation: Secondary | ICD-10-CM | POA: Diagnosis not present

## 2019-06-06 DIAGNOSIS — L578 Other skin changes due to chronic exposure to nonionizing radiation: Secondary | ICD-10-CM | POA: Diagnosis not present

## 2019-06-06 DIAGNOSIS — L820 Inflamed seborrheic keratosis: Secondary | ICD-10-CM | POA: Diagnosis not present

## 2019-06-20 DIAGNOSIS — H401122 Primary open-angle glaucoma, left eye, moderate stage: Secondary | ICD-10-CM | POA: Diagnosis not present

## 2019-06-27 DIAGNOSIS — H401122 Primary open-angle glaucoma, left eye, moderate stage: Secondary | ICD-10-CM | POA: Diagnosis not present

## 2019-07-01 ENCOUNTER — Other Ambulatory Visit: Payer: Self-pay | Admitting: Primary Care

## 2019-07-01 DIAGNOSIS — Z0000 Encounter for general adult medical examination without abnormal findings: Secondary | ICD-10-CM

## 2019-07-03 ENCOUNTER — Ambulatory Visit: Payer: Medicare Other

## 2019-07-03 ENCOUNTER — Ambulatory Visit (INDEPENDENT_AMBULATORY_CARE_PROVIDER_SITE_OTHER): Payer: Medicare Other

## 2019-07-03 DIAGNOSIS — Z0000 Encounter for general adult medical examination without abnormal findings: Secondary | ICD-10-CM

## 2019-07-03 NOTE — Progress Notes (Signed)
Subjective:   Christy [name] is a 73 y.o. female who presents for Medicare Annual (Subsequent) preventive examination.  Review of Systems: N/A   This visit is being conducted through telemedicine via telephone at the nurse health advisor's home address due to the COVID-19 pandemic. This patient has given me verbal consent via doximity to conduct this visit, patient states they are participating from their home address. Patient and myself are on the telephone call. There is no referral for this visit. Some vital signs may be absent or patient reported.    Patient identification: identified by name, DOB, and current address   Cardiac Risk Factors include: advanced age (>21mn, >>18women)     Objective:     Vitals: There were no vitals taken for this visit.  There is no height or weight on file to calculate BMI.  Advanced Directives 07/03/2019 02/12/2019 06/22/2018 02/12/2018 02/05/2018 10/05/2016  Does Patient Have a Medical Advance Directive? _0  Yes  Type of [place]amed[place]of [place];Out of facility DNR (pink MOST or yellow form) [place] - Living will  Does patient want to make changes to medical advance directive? - - - No - Patient declined - -  Copy of [place] Chart? Yes - validated most recent copy scanned in chart (See row information) Yes - validated most recent copy scanned in chart (See row information) Yes Yes - -    Tobacco Social History   Tobacco Use  Smoking Status Never Smoker  Smokeless Tobacco Never Used     Counseling given: Not Answered   Clinical Intake:  Pre-visit preparation completed: Yes  Pain : No/denies pain     Nutritional Risks: None Diabetes: No  How often do you need to have someone help you when you read instructions, pamphlets, or other written materials  from your doctor or pharmacy?: 1 - Never What is the last grade level you completed in school?: college  Interpreter Needed?: No  Information entered by :: CJohnson, LPN  Past Medical History:  Diagnosis Date  . Arthritis   . Atrophy of vagina   . Cystocele   . GERD (gastroesophageal reflux disease)   . Glaucoma    left eye  . Heart murmur   . Rectocele   . Uveitis    left   Past Surgical History:  Procedure Laterality Date  . ABDOMINAL HYSTERECTOMY    . CATARACT EXTRACTION, BILATERAL    . COLONOSCOPY WITH PROPOFOL N/A 02/12/2019   Procedure: COLONOSCOPY WITH PROPOFOL;  Surgeon: WLucilla Lame MD;  [facility];  Service: Endoscopy;  Laterality: N/A;  . ESOPHAGOGASTRODUODENOSCOPY (EGD) WITH PROPOFOL N/A 02/12/2019   Procedure: ESOPHAGOGASTRODUODENOSCOPY (EGD) WITH PROPOFOL;  Surgeon: WLucilla Lame MD;  [facility];  Service: Endoscopy;  Laterality: N/A;  . EYE SURGERY Bilateral    lens implant  . TUBAL LIGATION    . VAGINAL HYSTERECTOMY Bilateral 02/12/2018   Procedure: HYSTERECTOMY VAGINAL WITH BILATERAL SALPINGO OOPHERECTOMY;  Surgeon: DBrayton Mars MD;  Location: [facility] ORS;  Service: Gynecology;  Laterality: Bilateral;   Family History  Problem Relation Age of Onset  . Aortic stenosis Mother   . Heart disease Mother   . Osteoporosis Mother   . Cancer Father        prostate  . Heart disease Father   . Multiple sclerosis Daughter   . Breast cancer Maternal  Aunt    Social History   Socioeconomic History  . Marital status: Widowed    Spouse name: Not on file  . Number of children: Not on file  . Years of education: Not on file  . Highest education level: Not on file  Occupational History  . Not on file  Social Needs  . Financial resource strain: Not hard at all  . Food insecurity    Worry: Never true    Inability: Never true  . Transportation needs    Medical: No    Non-medical: No  Tobacco Use  . Smoking status: Never Smoker   . Smokeless tobacco: Never Used  Substance and Sexual Activity  . Alcohol use: Yes    Alcohol/week: 7.0 standard drinks    Types: 7 Glasses of wine per week    Comment: wine daily  . Drug use: No  . Sexual activity: Not Currently    Birth control/protection: Surgical  Lifestyle  . Physical activity    Days per week: 0 days    Minutes per session: 0 min  . Stress: Not at all  Relationships  . Social Herbalist on phone: Not on file    Gets together: Not on file    Attends religious service: Not on file    Active member of club or organization: Not on file    Attends meetings of clubs or organizations: Not on file    Relationship status: Not on file  Other Topics Concern  . Not on file  Social History Narrative   Recently moved to [facility] from [place].   She is primary caregiver for her husband who has Alzheimers.   Two children.      Retired Engineer, production.      Desires CPR.   Has living will- SCANNED INTO CHART   Would not want prolonged life support if futile.                   Outpatient Encounter Medications as of 07/03/2019  Medication Sig  . brimonidine (ALPHAGAN) 0.2 % ophthalmic solution Place 1 drop into the left eye 2 (two) times daily.   Marland Kitchen CALCIUM CARBONATE-VITAMIN D PO Take 1 tablet by mouth 2 (two) times daily.   . cholecalciferol (VITAMIN D) 1000 UNITS tablet Take 1,000 Units by mouth daily.  . Cranberry 500 MG CAPS Take 500 mg by mouth daily.   Marland Kitchen denosumab (PROLIA) 60 MG/ML SOLN injection Inject 60 mg into the skin every 6 (six) months. Administer in upper arm, thigh, or abdomen  . dorzolamide-timolol (COSOPT) 22.3-6.8 MG/ML ophthalmic solution Place 1 drop into the left eye every 12 (twelve) hours.  Marland Kitchen estradiol (ESTRACE) 0.1 MG/GM vaginal cream INSERT ONE-HALF GRAM  VAGINALLY TWICE WEEKLY  . GLUCOSAMINE CHONDROITIN COMPLX PO Take 1 tablet by mouth daily.   . Multiple Vitamin (MULTIVITAMIN) tablet Take 1 tablet by mouth daily.   . Omega-3 Fatty Acids (FISH OIL) 1200 MG CAPS Take 2,400 mg by mouth daily.   Marland Kitchen omeprazole (PRILOSEC) 40 MG capsule Take 40 mg by mouth daily.  Marland Kitchen PROLIA 60 MG/ML SOSY injection   . SUPREP BOWEL PREP KIT 17.5-3.13-1.6 GM/177ML SOLN See admin instructions.  . [DISCONTINUED] estradiol (ESTRACE) 0.1 MG/GM vaginal cream    No facility-administered encounter medications on file as of 07/03/2019.     Activities of Daily Living In your present state of health, do you have any difficulty performing the following activities: 07/03/2019  Hearing?  N  Vision? N  Difficulty concentrating or making decisions? N  Walking or climbing stairs? N  Dressing or bathing? N  Doing errands, shopping? N  Preparing Food and eating ? N  Using the Toilet? N  In the past six months, have you accidently leaked urine? N  Do you have problems with loss of bowel control? N  Managing your Medications? N  Managing your Finances? N  Housekeeping or managing your Housekeeping? N  Some recent data might be hidden    Patient Care Team: Pleas Koch, NP as PCP - General (Internal Medicine) Leandrew Koyanagi, MD as Referring Physician (Ophthalmology) Defrancesco, Alanda Slim, MD as Consulting Physician (Obstetrics and Gynecology)    Assessment:   This is a routine wellness examination for Christy [name].  Exercise Activities and Dietary recommendations Current Exercise Habits: Home exercise routine, Type of exercise: walking, Time (Minutes): 30, Frequency (Times/Week): 7, Weekly Exercise (Minutes/Week): 210, Intensity: Mild, Exercise limited by: None identified  Goals    . Increase physical activity     Starting 06/22/2018, I will continue to walk at least 1 mile daily.     . Patient Stated     07/03/2019, I will maintain and continue medications as prescribed.        Fall Risk Fall Risk  07/03/2019 06/22/2018 06/09/2016 06/09/2015 06/03/2014  Falls in the past year? 0 0 No No No  Number falls in past yr: 0 - -  - -  Injury with Fall? 0 - - - -  Follow up Falls evaluation completed;Falls prevention discussed - - - -   Is the patient's home free of loose throw rugs in walkways, pet beds, electrical cords, etc?   yes      Grab bars in the bathroom? yes      Handrails on the stairs?   yes      Adequate lighting?   yes  Timed Get Up and Go performed: N/A  Depression Screen PHQ 2/9 Scores 07/03/2019 06/22/2018 06/09/2016 06/09/2015  PHQ - 2 Score 0 0 0 0  PHQ- 9 Score 0 0 - -     Cognitive Function MMSE - Mini Mental State Exam 07/03/2019 06/22/2018  Orientation to time 5 5  Orientation to Place 5 5  Registration 3 3  Attention/ Calculation 5 0  Recall 3 3  Language- name 2 objects - 0  Language- repeat 1 1  Language- follow 3 step command - 3  Language- read & follow direction - 0  Write a sentence - 0  Copy design - 0  Total score - 20  Mini Cog  Mini-Cog screen was completed. Maximum score is 22. A value of 0 denotes this part of the MMSE was not completed or the patient failed this part of the Mini-Cog screening.       Immunization History  Administered Date(s) Administered  . Fluad Quad(high Dose 65+) 05/21/2019  . Influenza, Seasonal, Injecte, Preservative Fre 06/29/2006, 07/04/2007, 06/24/2008, 05/28/2009, 06/14/2010, 06/03/2011, 05/28/2012  . Influenza,inj,Quad PF,6+ Mos 05/06/2013, 05/20/2014, 06/09/2015, 06/15/2017, 06/22/2018  . Pneumococcal Conjugate-13 07/10/2014  . Pneumococcal Polysaccharide-23 01/05/2010  . Pneumococcal-Unspecified 01/05/2010  . Td 08/22/1996, 12/21/2006, 07/26/2017  . Tdap 12/21/2006  . Zoster 02/24/2006  . Zoster Recombinat (Shingrix) 03/29/2018, 07/04/2018    Qualifies for Shingles Vaccine? Completed series  Screening Tests Health Maintenance  Topic Date Due  . MAMMOGRAM  06/25/2020  . TETANUS/TDAP  07/27/2027  . COLONOSCOPY  02/11/2029  . INFLUENZA VACCINE  Completed  . DEXA SCAN  Completed  . Hepatitis C Screening  Completed  . PNA  vac Low Risk Adult  Completed    Cancer Screenings: Lung: Low Dose CT Chest recommended if Age 41-80 years, 30 pack-year currently smoking OR have quit w/in 15years. Patient does not qualify. Breast:  Up to date on Mammogram? Yes, scheduled 07/05/2019   Up to date of Bone Density/Dexa? Yes, completed 09/06/2017 Colorectal: completed 02/12/2019  Additional Screenings:  Hepatitis C Screening: 06/09/2016     Plan:    Patient will maintain and continue medications as prescribed.    I have personally reviewed and noted the following in the patient's chart:   . Medical and social history . Use of alcohol, tobacco or illicit drugs  . Current medications and supplements . Functional ability and status . Nutritional status . Physical activity . Advanced directives . List of other physicians . Hospitalizations, surgeries, and ER visits in previous 12 months . Vitals . Screenings to include cognitive, depression, and falls . Referrals and appointments  In addition, I have reviewed and discussed with patient certain preventive protocols, quality metrics, and best practice recommendations. A written personalized care plan for preventive services as well as general preventive health recommendations were provided to patient.     Andrez Grime, LPN  [id number]

## 2019-07-03 NOTE — Progress Notes (Signed)
PCP notes:  Health Maintenance: none   Abnormal Screenings: none   Patient concerns: None   Nurse concerns: none   Next PCP appt.: 07/10/2019 @ 10:20 am

## 2019-07-03 NOTE — Patient Instructions (Signed)
[name] , Thank you for taking time to come for your Medicare Wellness Visit. I appreciate your ongoing commitment to your health goals. Please review the following plan we discussed and let me know if I can assist you in the future.   Screening recommendations/referrals: Colonoscopy: Up to date, completed 02/12/2019 Mammogram: scheduled for 07/05/2019 Bone Density: Up to date, completed 09/06/2017 Recommended yearly ophthalmology/optometry visit for glaucoma screening and checkup Recommended yearly dental visit for hygiene and checkup  Vaccinations: Influenza vaccine: Up to date, completed 05/21/2019 Pneumococcal vaccine: Completed series Tdap vaccine: Up to date, completed 07/26/2017 Shingles vaccine: Completed series    Advanced directives: copy in chart  Conditions/risks identified: none  Next appointment: 07/10/2019 @ 10:20 am    Preventive Care 65 Years and Older, Female Preventive care refers to lifestyle choices and visits with your health care provider that can promote health and wellness. What does preventive care include?  A yearly physical exam. This is also called an annual well check.  Dental exams once or twice a year.  Routine eye exams. Ask your health care provider how often you should have your eyes checked.  Personal lifestyle choices, including:  Daily care of your teeth and gums.  Regular physical activity.  Eating a healthy diet.  Avoiding tobacco and drug use.  Limiting alcohol use.  Practicing safe sex.  Taking low-dose aspirin every day.  Taking vitamin and mineral supplements as recommended by your health care provider. What happens during an annual well check? The services and screenings done by your health care provider during your annual well check will depend on your age, overall health, lifestyle risk factors, and family history of disease. Counseling  Your health care provider may ask you questions about your:  Alcohol use.   Tobacco use.  Drug use.  Emotional well-being.  Home and relationship well-being.  Sexual activity.  Eating habits.  History of falls.  Memory and ability to understand (cognition).  Work and work Statistician.  Reproductive health. Screening  You may have the following tests or measurements:  Height, weight, and BMI.  Blood pressure.  Lipid and cholesterol levels. These may be checked every 5 years, or more frequently if you are over 70 years old.  Skin check.  Lung cancer screening. You may have this screening every year starting at age 62 if you have a 30-pack-year history of smoking and currently smoke or have quit within the past 15 years.  Fecal occult blood test (FOBT) of the stool. You may have this test every year starting at age 18.  Flexible sigmoidoscopy or colonoscopy. You may have a sigmoidoscopy every 5 years or a colonoscopy every 10 years starting at age 68.  Hepatitis C blood test.  Hepatitis B blood test.  Sexually transmitted disease (STD) testing.  Diabetes screening. This is done by checking your blood sugar (glucose) after you have not eaten for a while (fasting). You may have this done every 1-3 years.  Bone density scan. This is done to screen for osteoporosis. You may have this done starting at age 30.  Mammogram. This may be done every 1-2 years. Talk to your health care provider about how often you should have regular mammograms. Talk with your health care provider about your test results, treatment options, and if necessary, the need for more tests. Vaccines  Your health care provider may recommend certain vaccines, such as:  Influenza vaccine. This is recommended every year.  Tetanus, diphtheria, and acellular pertussis (Tdap, Td) vaccine.  You may need a Td booster every 10 years.  Zoster vaccine. You may need this after age 38.  Pneumococcal 13-valent conjugate (PCV13) vaccine. One dose is recommended after age 16.  Pneumococcal  polysaccharide (PPSV23) vaccine. One dose is recommended after age 48. Talk to your health care provider about which screenings and vaccines you need and how often you need them. This information is not intended to replace advice given to you by your health care provider. Make sure you discuss any questions you have with your health care provider. Document Released: 09/04/2015 Document Revised: 04/27/2016 Document Reviewed: 06/09/2015 Elsevier Interactive Patient Education  2017 [employer] Prevention in the Home Falls can cause injuries. They can happen to people of all ages. There are many things you can do to make your home safe and to help prevent falls. What can I do on the outside of my home?  Regularly fix the edges of walkways and driveways and fix any cracks.  Remove anything that might make you trip as you walk through a door, such as a raised step or threshold.  Trim any bushes or trees on the path to your home.  Use bright outdoor lighting.  Clear any walking paths of anything that might make someone trip, such as rocks or tools.  Regularly check to see if handrails are loose or broken. Make sure that both sides of any steps have handrails.  Any raised decks and porches should have guardrails on the edges.  Have any leaves, snow, or ice cleared regularly.  Use sand or salt on walking paths during winter.  Clean up any spills in your garage right away. This includes oil or grease spills. What can I do in the bathroom?  Use night lights.  Install grab bars by the toilet and in the tub and shower. Do not use towel bars as grab bars.  Use non-skid mats or decals in the tub or shower.  If you need to sit down in the shower, use a plastic, non-slip stool.  Keep the floor dry. Clean up any water that spills on the floor as soon as it happens.  Remove soap buildup in the tub or shower regularly.  Attach bath mats securely with double-sided non-slip rug tape.   Do not have throw rugs and other things on the floor that can make you trip. What can I do in the bedroom?  Use night lights.  Make sure that you have a light by your bed that is easy to reach.  Do not use any sheets or blankets that are too big for your bed. They should not hang down onto the floor.  Have a firm chair that has side arms. You can use this for support while you get dressed.  Do not have throw rugs and other things on the floor that can make you trip. What can I do in the kitchen?  Clean up any spills right away.  Avoid walking on wet floors.  Keep items that you use a lot in easy-to-reach places.  If you need to reach something above you, use a strong step stool that has a grab bar.  Keep electrical cords out of the way.  Do not use floor polish or wax that makes floors slippery. If you must use wax, use non-skid floor wax.  Do not have throw rugs and other things on the floor that can make you trip. What can I do with my stairs?  Do not leave any  items on the stairs.  Make sure that there are handrails on both sides of the stairs and use them. Fix handrails that are broken or loose. Make sure that handrails are as long as the stairways.  Check any carpeting to make sure that it is firmly attached to the stairs. Fix any carpet that is loose or worn.  Avoid having throw rugs at the top or bottom of the stairs. If you do have throw rugs, attach them to the floor with carpet tape.  Make sure that you have a light switch at the top of the stairs and the bottom of the stairs. If you do not have them, ask someone to add them for you. What else can I do to help prevent falls?  Wear shoes that:  Do not have high heels.  Have rubber bottoms.  Are comfortable and fit you well.  Are closed at the toe. Do not wear sandals.  If you use a stepladder:  Make sure that it is fully opened. Do not climb a closed stepladder.  Make sure that both sides of the  stepladder are locked into place.  Ask someone to hold it for you, if possible.  Clearly mark and make sure that you can see:  Any grab bars or handrails.  First and last steps.  Where the edge of each step is.  Use tools that help you move around (mobility aids) if they are needed. These include:  Canes.  Walkers.  Scooters.  Crutches.  Turn on the lights when you go into a dark area. Replace any light bulbs as soon as they burn out.  Set up your furniture so you have a clear path. Avoid moving your furniture around.  If any of your floors are uneven, fix them.  If there are any pets around you, be aware of where they are.  Review your medicines with your doctor. Some medicines can make you feel dizzy. This can increase your chance of falling. Ask your doctor what other things that you can do to help prevent falls. This information is not intended to replace advice given to you by your health care provider. Make sure you discuss any questions you have with your health care provider. Document Released: 06/04/2009 Document Revised: 01/14/2016 Document Reviewed: 09/12/2014 Elsevier Interactive Patient Education  2017 [employer].

## 2019-07-04 ENCOUNTER — Other Ambulatory Visit (INDEPENDENT_AMBULATORY_CARE_PROVIDER_SITE_OTHER): Payer: Medicare Other

## 2019-07-04 DIAGNOSIS — Z0000 Encounter for general adult medical examination without abnormal findings: Secondary | ICD-10-CM

## 2019-07-04 LAB — COMPREHENSIVE METABOLIC PANEL
ALT: 20 U/L (ref 0–35)
AST: 27 U/L (ref 0–37)
Albumin: 4.3 g/dL (ref 3.5–5.2)
Alkaline Phosphatase: 28 U/L — ABNORMAL LOW (ref 39–117)
BUN: 11 mg/dL (ref 6–23)
CO2: 29 mEq/L (ref 19–32)
Calcium: 8.8 mg/dL (ref 8.4–10.5)
Chloride: 103 mEq/L (ref 96–112)
Creatinine, Ser: 0.67 mg/dL (ref 0.40–1.20)
GFR: 85.89 mL/min (ref >60.00)
Glucose, Bld: 104 mg/dL — ABNORMAL HIGH (ref 70–99)
Potassium: 4 mEq/L (ref 3.5–5.1)
Sodium: 139 mEq/L (ref 135–145)
Total Bilirubin: 0.6 mg/dL (ref 0.2–1.2)
Total Protein: 6.4 g/dL (ref 6.0–8.3)

## 2019-07-04 LAB — LIPID PANEL
Cholesterol: 202 mg/dL — ABNORMAL HIGH (ref 0–200)
HDL: 71.5 mg/dL (ref >39.00)
LDL Cholesterol: 119 mg/dL — ABNORMAL HIGH (ref 0–99)
NonHDL: 130.13
Total CHOL/HDL Ratio: 3
Triglycerides: 57 mg/dL (ref 0.0–149.0)
VLDL: 11.4 mg/dL (ref 0.0–40.0)

## 2019-07-05 ENCOUNTER — Ambulatory Visit
Admission: RE | Admit: 2019-07-05 | Discharge: 2019-07-05 | Disposition: A | Discharge status: Home / Self Care | Payer: Medicare Other | Source: Ambulatory Visit | Attending: Primary Care | Admitting: Primary Care

## 2019-07-05 DIAGNOSIS — Z1231 Encounter for screening mammogram for malignant neoplasm of breast: Secondary | ICD-10-CM | POA: Diagnosis not present

## 2019-07-08 ENCOUNTER — Encounter: Payer: Medicare Other | Admitting: Obstetrics and Gynecology

## 2019-07-09 ENCOUNTER — Ambulatory Visit (INDEPENDENT_AMBULATORY_CARE_PROVIDER_SITE_OTHER): Payer: Medicare Other | Admitting: Obstetrics and Gynecology

## 2019-07-09 ENCOUNTER — Other Ambulatory Visit: Payer: Self-pay

## 2019-07-09 ENCOUNTER — Encounter: Payer: Self-pay | Admitting: Obstetrics and Gynecology

## 2019-07-09 VITALS — BP 148/89 | HR 86 | Ht 65.0 in | Wt 125.4 lb

## 2019-07-09 DIAGNOSIS — Z01419 Encounter for gynecological examination (general) (routine) without abnormal findings: Secondary | ICD-10-CM | POA: Diagnosis not present

## 2019-07-09 DIAGNOSIS — N952 Postmenopausal atrophic vaginitis: Secondary | ICD-10-CM

## 2019-07-09 MED ORDER — ESTRADIOL 0.1 MG/GM VA CREA: TOPICAL_CREAM | VAGINAL | 0 refills | Status: DC

## 2019-07-09 NOTE — Progress Notes (Signed)
HPI:      [name] is a 73 y.o. [phone] who LMP was No LMP recorded. Patient is postmenopausal.  Subjective:   She presents today for her annual examination.  She has no specific gynecologic complaints.  She continues to use estrogen vaginal cream twice weekly.  After long discussion she has chosen to continue this medicine. She is taking medication for osteoporosis and has seen an increase in her DEXA scores 1 year ago. She describes some hesitancy of urine stream but no leakage at this time.    Hx: The following portions of the patient's history were reviewed and updated as appropriate:             She  has a past medical history of Arthritis, Atrophy of vagina, Cystocele, GERD (gastroesophageal reflux disease), Glaucoma, Heart murmur, Rectocele, and Uveitis. She does not have any pertinent problems on file. She  has a past surgical history that includes Cataract extraction, bilateral; Tubal ligation; Eye surgery (Bilateral); Vaginal hysterectomy (Bilateral, 02/12/2018); Abdominal hysterectomy; Colonoscopy with propofol (N/A, 02/12/2019); and Esophagogastroduodenoscopy (egd) with propofol (N/A, 02/12/2019). Her family history includes Aortic stenosis in her mother; Breast cancer in her maternal aunt; Cancer in her father; Heart disease in her father and mother; Multiple sclerosis in her daughter; Osteoporosis in her mother. She  reports that she has never smoked. She has never used smokeless tobacco. She reports current alcohol use of about 7.0 standard drinks of alcohol per week. She reports that she does not use drugs. She has a current medication list which includes the following prescription(s): brimonidine, calcium carbonate-vitamin d, cranberry, denosumab, dorzolamide-timolol, estradiol, glucosamine-chondroitin, multivitamin, fish oil, omeprazole, and cholecalciferol. She is allergic to xalatan [latanoprost].       Review of Systems:  Review of Systems  Constitutional: Denied  constitutional symptoms, night sweats, recent illness, fatigue, fever, insomnia and weight loss.  Eyes: Denied eye symptoms, eye pain, photophobia, vision change and visual disturbance.  Ears/Nose/Throat/Neck: Denied ear, nose, throat or neck symptoms, hearing loss, nasal discharge, sinus congestion and sore throat.  Cardiovascular: Denied cardiovascular symptoms, arrhythmia, chest pain/pressure, edema, exercise intolerance, orthopnea and palpitations.  Respiratory: Denied pulmonary symptoms, asthma, pleuritic pain, productive sputum, cough, dyspnea and wheezing.  Gastrointestinal: Denied, gastro-esophageal reflux, melena, nausea and vomiting.  Genitourinary: Denied genitourinary symptoms including symptomatic vaginal discharge, pelvic relaxation issues, and urinary complaints.  Musculoskeletal: Denied musculoskeletal symptoms, stiffness, swelling, muscle weakness and myalgia.  Dermatologic: Denied dermatology symptoms, rash and scar.  Neurologic: Denied neurology symptoms, dizziness, headache, neck pain and syncope.  Psychiatric: Denied psychiatric symptoms, anxiety and depression.  Endocrine: Denied endocrine symptoms including hot flashes and night sweats.   Meds:   Current Outpatient Medications on File Prior to Visit  Medication Sig Dispense Refill  . brimonidine (ALPHAGAN) 0.2 % ophthalmic solution Place 1 drop into the left eye 2 (two) times daily.     Marland Kitchen CALCIUM CARBONATE-VITAMIN D PO Take 1 tablet by mouth 2 (two) times daily.     . Cranberry 500 MG CAPS Take 500 mg by mouth daily.     Marland Kitchen denosumab (PROLIA) 60 MG/ML SOLN injection Inject 60 mg into the skin every 6 (six) months. Administer in upper arm, thigh, or abdomen    . dorzolamide-timolol (COSOPT) 22.3-6.8 MG/ML ophthalmic solution Place 1 drop into the left eye every 12 (twelve) hours.    Marland Kitchen GLUCOSAMINE CHONDROITIN COMPLX PO Take 1 tablet by mouth daily.     . Multiple Vitamin (MULTIVITAMIN) tablet Take 1 tablet by  mouth daily.     . Omega-3 Fatty Acids (FISH OIL) 1200 MG CAPS Take 2,400 mg by mouth daily.     Marland Kitchen omeprazole (PRILOSEC) 40 MG capsule Take 40 mg by mouth daily.    . cholecalciferol (VITAMIN D) 1000 UNITS tablet Take 1,000 Units by mouth daily.     No current facility-administered medications on file prior to visit.     Objective:     Vitals:   07/09/19 1340  BP: (!) 148/89  Pulse: 86              Physical examination General NAD, Conversant  HEENT Atraumatic; Op clear with mmm.  Normo-cephalic. Pupils reactive. Anicteric sclerae  Thyroid/Neck Smooth without nodularity or enlargement. Normal ROM.  Neck Supple.  Skin No rashes, lesions or ulceration. Normal palpated skin turgor. No nodularity.  Breasts: No masses or discharge.  Symmetric.  No axillary adenopathy.  Lungs: Clear to auscultation.No rales or wheezes. Normal Respiratory effort, no retractions.  Heart: NSR.  No murmurs or rubs appreciated. No periferal edema  Abdomen: Soft.  Non-tender.  No masses.  No HSM. No hernia  Extremities: Moves all appropriately.  Normal ROM for age. No lymphadenopathy.  Neuro: Oriented to PPT.  Normal mood. Normal affect.     Pelvic:   Vulva: Normal appearance.  No lesions.   Vagina: No lesions or abnormalities noted.  Mild vaginal atrophy  Support: Normal pelvic support.  Urethra No masses tenderness or scarring.  Meatus Normal size without lesions or prolapse.  Cervix: Surgically absent   Anus: Normal exam.  No lesions.  Perineum: Normal exam.  No lesions.        Bimanual   Uterus: Surgically absent   Adnexae: No masses.  Non-tender to palpation.  Cul-de-sac: Negative for abnormality.     Assessment:    [id number] Patient Active Problem List   Diagnosis Date Noted  . Encounter for screening colonoscopy   . Healthcare maintenance 07/06/2018  . Atrophy of vagina 07/04/2018  . S/P TVHBSO with Anterior/Posterior Colporrhaphy 02/12/2018  . Glaucoma   . Uveitis   . GERD (gastroesophageal reflux  disease) 06/09/2015  . Osteoporosis 09/02/2013  . Encounter for Medicare annual wellness exam 05/06/2013  . Arthritis      1. Well woman exam with routine gynecological exam   2. Atrophy of vagina     Patient generally doing well gynecologicly.  Continues to use estrogen vaginal cream.   Plan:            1.  Basic Screening Recommendations The basic screening recommendations for asymptomatic women were discussed with the patient during her visit.  The age-appropriate recommendations were discussed with her and the rational for the tests reviewed.  When I am informed by the patient that another primary care physician has previously obtained the age-appropriate tests and they are up-to-date, only outstanding tests are ordered and referrals given as necessary.  Abnormal results of tests will be discussed with her when all of her results are completed.  Routine preventative health maintenance measures emphasized: Exercise/Diet/Weight control, Tobacco Warnings, Alcohol/Substance use risks and Stress Management  Orders No orders of the defined types were placed in this encounter.    Meds ordered this encounter  Medications  . estradiol (ESTRACE) 0.1 MG/GM vaginal cream    Sig: INSERT ONE-HALF GRAM  VAGINALLY TWICE WEEKLY    Dispense:  42.5 g    Refill:  0    Please consider 90 day supplies to promote better adherence  F/U  Return in about 1 year (around 07/08/2020) for Annual Physical.  Finis Bud, M.D. 07/09/2019 2:14 PM

## 2019-07-10 ENCOUNTER — Encounter: Payer: Self-pay | Admitting: Primary Care

## 2019-07-10 ENCOUNTER — Ambulatory Visit (INDEPENDENT_AMBULATORY_CARE_PROVIDER_SITE_OTHER): Payer: Medicare Other | Admitting: Primary Care

## 2019-07-10 VITALS — BP 116/70 | HR 77 | Temp 97.5°F | Ht 65.0 in | Wt 125.0 lb

## 2019-07-10 DIAGNOSIS — H4089 Other specified glaucoma: Secondary | ICD-10-CM | POA: Diagnosis not present

## 2019-07-10 DIAGNOSIS — M810 Age-related osteoporosis without current pathological fracture: Secondary | ICD-10-CM

## 2019-07-10 DIAGNOSIS — N952 Postmenopausal atrophic vaginitis: Secondary | ICD-10-CM | POA: Diagnosis not present

## 2019-07-10 DIAGNOSIS — E785 Hyperlipidemia, unspecified: Secondary | ICD-10-CM | POA: Diagnosis not present

## 2019-07-10 DIAGNOSIS — R739 Hyperglycemia, unspecified: Secondary | ICD-10-CM

## 2019-07-10 DIAGNOSIS — K219 Gastro-esophageal reflux disease without esophagitis: Secondary | ICD-10-CM | POA: Diagnosis not present

## 2019-07-10 DIAGNOSIS — Z0000 Encounter for general adult medical examination without abnormal findings: Secondary | ICD-10-CM | POA: Diagnosis not present

## 2019-07-10 DIAGNOSIS — R7303 Prediabetes: Secondary | ICD-10-CM

## 2019-07-10 LAB — POCT GLYCOSYLATED HEMOGLOBIN (HGB A1C): Hemoglobin A1C: 5.7 % — AB (ref 4.0–5.6)

## 2019-07-10 MED ORDER — ATORVASTATIN CALCIUM 10 MG PO TABS: 10.0000 mg | ORAL_TABLET | Freq: Every evening | ORAL | 3 refills | Status: DC

## 2019-07-10 NOTE — Assessment & Plan Note (Addendum)
Slight hypoglycemia noted on recent labs. POC A1C completed today and is 5.7. Will discuss with patient and continue to monitor.

## 2019-07-10 NOTE — Assessment & Plan Note (Signed)
Endoscopy completed in 2020, placed on omeprazole 40 mg and has done well. She does plan on transitioning to famotidine. Following with ENT.

## 2019-07-10 NOTE — Assessment & Plan Note (Signed)
Increase in LDL with ASCVD risk score of 12%. Discussed results with patient and given her LDL level coupled with family history we decided to proceed with low dose statin therapy.  Rx for atorvastatin sent to pharmacy. Repeat lipids and LFT's in 6 weeks.

## 2019-07-10 NOTE — Patient Instructions (Addendum)
Stop by the lab prior to leaving today. I will notify you of your results once received.   Start atorvastatin 10 mg tablets once daily for cholesterol and stroke prevention.  You should be getting 150 minutes of moderate intensity exercise weekly.  Continue to work on a healthy diet.   Schedule a lab only appointment for 6 weeks for cholesterol check.  Follow up in 1 year.  It was a pleasure to see you today!   Preventive Care 13 Years and Older, Female Preventive care refers to lifestyle choices and visits with your health care provider that can promote health and wellness. This includes:  A yearly physical exam. This is also called an annual well check.  Regular dental and eye exams.  Immunizations.  Screening for certain conditions.  Healthy lifestyle choices, such as diet and exercise. What can I expect for my preventive care visit? Physical exam Your health care provider will check:  Height and weight. These may be used to calculate body mass index (BMI), which is a measurement that tells if you are at a healthy weight.  Heart rate and blood pressure.  Your skin for abnormal spots. Counseling Your health care provider may ask you questions about:  Alcohol, tobacco, and drug use.  Emotional well-being.  Home and relationship well-being.  Sexual activity.  Eating habits.  History of falls.  Memory and ability to understand (cognition).  Work and work Statistician.  Pregnancy and menstrual history. What immunizations do I need?  Influenza (flu) vaccine  This is recommended every year. Tetanus, diphtheria, and pertussis (Tdap) vaccine  You may need a Td booster every 10 years. Varicella (chickenpox) vaccine  You may need this vaccine if you have not already been vaccinated. Zoster (shingles) vaccine  You may need this after age 58. Pneumococcal conjugate (PCV13) vaccine  One dose is recommended after age 61. Pneumococcal polysaccharide (PPSV23)  vaccine  One dose is recommended after age 40. Measles, mumps, and rubella (MMR) vaccine  You may need at least one dose of MMR if you were born in 1957 or later. You may also need a second dose. Meningococcal conjugate (MenACWY) vaccine  You may need this if you have certain conditions. Hepatitis A vaccine  You may need this if you have certain conditions or if you travel or work in places where you may be exposed to hepatitis A. Hepatitis B vaccine  You may need this if you have certain conditions or if you travel or work in places where you may be exposed to hepatitis B. Haemophilus influenzae type b (Hib) vaccine  You may need this if you have certain conditions. You may receive vaccines as individual doses or as more than one vaccine together in one shot (combination vaccines). Talk with your health care provider about the risks and benefits of combination vaccines. What tests do I need? Blood tests  Lipid and cholesterol levels. These may be checked every 5 years, or more frequently depending on your overall health.  Hepatitis C test.  Hepatitis B test. Screening  Lung cancer screening. You may have this screening every year starting at age 44 if you have a 30-pack-year history of smoking and currently smoke or have quit within the past 15 years.  Colorectal cancer screening. All adults should have this screening starting at age 76 and continuing until age 30. Your health care provider may recommend screening at age 32 if you are at increased risk. You will have tests every 1-10 years, depending on  your results and the type of screening test.  Diabetes screening. This is done by checking your blood sugar (glucose) after you have not eaten for a while (fasting). You may have this done every 1-3 years.  Mammogram. This may be done every 1-2 years. Talk with your health care provider about how often you should have regular mammograms.  BRCA-related cancer screening. This may  be done if you have a family history of breast, ovarian, tubal, or peritoneal cancers. Other tests  Sexually transmitted disease (STD) testing.  Bone density scan. This is done to screen for osteoporosis. You may have this done starting at age 64. Follow these instructions at home: Eating and drinking  Eat a diet that includes fresh fruits and vegetables, whole grains, lean protein, and low-fat dairy products. Limit your intake of foods with high amounts of sugar, saturated fats, and salt.  Take vitamin and mineral supplements as recommended by your health care provider.  Do not drink alcohol if your health care provider tells you not to drink.  If you drink alcohol: ? Limit how much you have to 0-1 drink a day. ? Be aware of how much alcohol is in your drink. In the [place] one drink equals one 12 oz bottle of beer (355 mL), one 5 oz glass of wine (148 mL), or one 1 oz glass of hard liquor (44 mL). Lifestyle  Take daily care of your teeth and gums.  Stay active. Exercise for at least 30 minutes on 5 or more days each week.  Do not use any products that contain nicotine or tobacco, such as cigarettes, e-cigarettes, and chewing tobacco. If you need help quitting, ask your health care provider.  If you are sexually active, practice safe sex. Use a condom or other form of protection in order to prevent STIs (sexually transmitted infections).  Talk with your health care provider about taking a low-dose aspirin or statin. What's next?  Go to your health care provider once a year for a well check visit.  Ask your health care provider how often you should have your eyes and teeth checked.  Stay up to date on all vaccines. This information is not intended to replace advice given to you by your health care provider. Make sure you discuss any questions you have with your health care provider. Document Released: 09/04/2015 Document Revised: 08/02/2018 Document Reviewed: 08/02/2018 Elsevier  Patient Education  2020 [employer].

## 2019-07-10 NOTE — Assessment & Plan Note (Signed)
Immunizations UTD. Bone density UTD. Mammogram UTD. Encouraged regular exercise, healthy diet. Exam today unremarkable. Labs reviewed.

## 2019-07-10 NOTE — Assessment & Plan Note (Signed)
Compliant to Prolia injections, calcium and vitamin D. Recent calcium level within normal limits. Bone density in 2019 unchanged from 2016. Repeat in 2021.

## 2019-07-10 NOTE — Progress Notes (Signed)
Subjective:    Patient ID: Christy [name], female    DOB: 09-Jul-1946, 73 y.o.   MRN: [id number]  HPI  [name] is a 73 year old female who presents today for [place] Part 2. She spoke with our health advisor already.  Immunizations: -Tetanus: Completed in 2018 -Influenza: Completed this season  -Shingles: Completed Shingrix -Pneumonia: Completed Prevnar and Pneumovax  Mammogram: Completed in 2020 Dexa: Completed in 2019, osteopenia and same as bone density from 2016. Colonoscopy: Completed in 2020, no need to repeat per GI Hep C Screen: Negative  BP Readings from Last 3 Encounters:  07/10/19 116/70  07/09/19 (!) 148/89  02/12/19 109/82   The 10-year ASCVD risk score [name], et al., 2013) is: 12%   Values used to calculate the score:     Age: 9 years     Sex: Female     Is Non-Hispanic African American: No     Diabetic: No     Tobacco smoker: No     Systolic Blood Pressure: 99991111 mmHg     Is BP treated: No     HDL Cholesterol: 71.5 mg/dL     Total Cholesterol: 202 mg/dL   Review of Systems  Constitutional: Negative for unexpected weight change.  HENT: Negative for rhinorrhea.   Respiratory: Negative for cough and shortness of breath.   Cardiovascular: Negative for chest pain.  Gastrointestinal: Negative for constipation and diarrhea.  Genitourinary: Negative for difficulty urinating.  Musculoskeletal: Negative for arthralgias and myalgias.  Skin: Negative for rash.  Allergic/Immunologic: Negative for environmental allergies.  Neurological: Negative for dizziness, numbness and headaches.  Psychiatric/Behavioral: The patient is not nervous/anxious.        Past Medical History:  Diagnosis Date  . Arthritis   . Atrophy of vagina   . Cystocele   . GERD (gastroesophageal reflux disease)   . Glaucoma    left eye  . Heart murmur   . Rectocele   . Uveitis    left     Social History   Socioeconomic History  . Marital status: Widowed    Spouse name:  Not on file  . Number of children: Not on file  . Years of education: Not on file  . Highest education level: Not on file  Occupational History  . Not on file  Social Needs  . Financial resource strain: Not hard at all  . Food insecurity    Worry: Never true    Inability: Never true  . Transportation needs    Medical: No    Non-medical: No  Tobacco Use  . Smoking status: Never Smoker  . Smokeless tobacco: Never Used  Substance and Sexual Activity  . Alcohol use: Yes    Alcohol/week: 7.0 standard drinks    Types: 7 Glasses of wine per week    Comment: wine daily  . Drug use: No  . Sexual activity: Not Currently    Birth control/protection: Surgical  Lifestyle  . Physical activity    Days per week: 0 days    Minutes per session: 0 min  . Stress: Not at all  Relationships  . Social Herbalist on phone: Not on file    Gets together: Not on file    Attends religious service: Not on file    Active member of club or organization: Not on file    Attends meetings of clubs or organizations: Not on file    Relationship status: Not on file  .  Intimate partner violence    Fear of current or ex partner: No    Emotionally abused: No    Physically abused: No    Forced sexual activity: No  Other Topics Concern  . Not on file  Social History Narrative   Recently moved to [facility] from [place].   She is primary caregiver for her husband who has Alzheimers.   Two children.      Retired Engineer, production.      Desires CPR.   Has living will- SCANNED INTO CHART   Would not want prolonged life support if futile.                   Past Surgical History:  Procedure Laterality Date  . ABDOMINAL HYSTERECTOMY    . CATARACT EXTRACTION, BILATERAL    . COLONOSCOPY WITH PROPOFOL N/A 02/12/2019   Procedure: COLONOSCOPY WITH PROPOFOL;  Surgeon: Lucilla Lame, MD;  Location: [facility] ENDOSCOPY;  Service: Endoscopy;  Laterality: N/A;  . ESOPHAGOGASTRODUODENOSCOPY (EGD)  WITH PROPOFOL N/A 02/12/2019   Procedure: ESOPHAGOGASTRODUODENOSCOPY (EGD) WITH PROPOFOL;  Surgeon: Lucilla Lame, MD;  Location: [facility] ENDOSCOPY;  Service: Endoscopy;  Laterality: N/A;  . EYE SURGERY Bilateral    lens implant  . TUBAL LIGATION    . VAGINAL HYSTERECTOMY Bilateral 02/12/2018   Procedure: HYSTERECTOMY VAGINAL WITH BILATERAL SALPINGO OOPHERECTOMY;  Surgeon: Brayton Mars, MD;  Location: [facility] ORS;  Service: Gynecology;  Laterality: Bilateral;    Family History  Problem Relation Age of Onset  . Aortic stenosis Mother   . Heart disease Mother   . Osteoporosis Mother   . Cancer Father        prostate  . Heart disease Father   . Multiple sclerosis Daughter   . Breast cancer Maternal Aunt     Allergies  Allergen Reactions  . Xalatan [Latanoprost] Itching, Swelling and Other (See Comments)    Caused eye to be red, swollen,itchy    Current Outpatient Medications on File Prior to Visit  Medication Sig Dispense Refill  . brimonidine (ALPHAGAN) 0.2 % ophthalmic solution Place 1 drop into the left eye 2 (two) times daily.     Marland Kitchen CALCIUM CARBONATE-VITAMIN D PO Take 1 tablet by mouth 2 (two) times daily.     . cholecalciferol (VITAMIN D) 1000 UNITS tablet Take 1,000 Units by mouth daily.    . Cranberry 500 MG CAPS Take 500 mg by mouth daily.     Marland Kitchen denosumab (PROLIA) 60 MG/ML SOLN injection Inject 60 mg into the skin every 6 (six) months. Administer in upper arm, thigh, or abdomen    . dorzolamide-timolol (COSOPT) 22.3-6.8 MG/ML ophthalmic solution Place 1 drop into the left eye every 12 (twelve) hours.    Marland Kitchen estradiol (ESTRACE) 0.1 MG/GM vaginal cream INSERT ONE-HALF GRAM  VAGINALLY TWICE WEEKLY 42.5 g 0  . GLUCOSAMINE CHONDROITIN COMPLX PO Take 1 tablet by mouth daily.     . Multiple Vitamin (MULTIVITAMIN) tablet Take 1 tablet by mouth daily.    . Omega-3 Fatty Acids (FISH OIL) 1200 MG CAPS Take 2,400 mg by mouth daily.     Marland Kitchen omeprazole (PRILOSEC) 40 MG capsule Take 40 mg by  mouth daily.     No current facility-administered medications on file prior to visit.     BP 116/70   Pulse 77   Temp (!) 97.5 F (36.4 C) (Temporal)   Ht 5\' 5"  (1.651 m)   Wt 125 lb (56.7 kg)   SpO2 98%  BMI 20.80 kg/m    Objective:   Physical Exam  Constitutional: She is oriented to person, place, and time. She appears well-nourished.  HENT:  Right Ear: Tympanic membrane and ear canal normal.  Left Ear: Tympanic membrane and ear canal normal.  Mouth/Throat: Oropharynx is clear and moist.  Eyes: Pupils are equal, round, and reactive to light. EOM are normal.  Neck: Neck supple.  Cardiovascular: Normal rate and regular rhythm.  Respiratory: Effort normal and breath sounds normal.  GI: Soft. Bowel sounds are normal. There is no abdominal tenderness.  Musculoskeletal: Normal range of motion.  Neurological: She is alert and oriented to person, place, and time.  Skin: Skin is warm and dry.  Psychiatric: She has a normal mood and affect.           Assessment & Plan:

## 2019-07-10 NOTE — Assessment & Plan Note (Signed)
Following with ophthalmology, continue current regimen. 

## 2019-07-10 NOTE — Progress Notes (Signed)
   Subjective:    Patient ID: Christy [name], female    DOB: 1945/10/14, 73 y.o.   MRN: [id number]  HPI Christy [name] is a 73 year old female with a history of GERD, osteoporosis, glaucoma, and vaginal atrophy who presents today for part two of her Medicare wellness exam. She spoke with the health advisor prior to today's visit.   Immunizations:  0Tetanus: Completed 2018 Influenza: Completed this season  Pneumonia: Completed Prevnar and Pneumovax Shingles: Completed Shingrix in 2019   Mammogram: Completed 2020 Dexa: Completed 2019, osteopenia, T-score -1.8, Due 2021 Colonoscopy: Completed 2020, no need to repeat per GI.  Hep C Screening: Negative 2017    BP Readings from Last 3 Encounters:  07/10/19 116/70  07/09/19 (!) 148/89  02/12/19 109/82   The 10-year ASCVD risk score [name], et al., 2013) is: 12%   Values used to calculate the score:     Age: 18 years     Sex: Female     Is Non-Hispanic African American: No     Diabetic: No     Tobacco smoker: No     Systolic Blood Pressure: 99991111 mmHg     Is BP treated: No     HDL Cholesterol: 71.5 mg/dL     Total Cholesterol: 202 mg/dL   Review of Systems  Constitutional: Negative for appetite change and unexpected weight change.  HENT: Negative for hearing loss and rhinorrhea.   Respiratory: Negative for cough and shortness of breath.   Cardiovascular: Negative for chest pain.  Gastrointestinal: Negative for constipation and diarrhea.  Genitourinary: Negative for difficulty urinating.  Musculoskeletal: Negative for arthralgias and myalgias.  Skin: Negative for rash.  Allergic/Immunologic: Negative for environmental allergies.  Neurological: Negative for dizziness, weakness, numbness and headaches.  Psychiatric/Behavioral: The patient is not nervous/anxious.        Objective:   Physical Exam HENT:     Right Ear: Tympanic membrane and ear canal normal.     Left Ear: Tympanic membrane and ear canal normal.   Nose: Nose normal.     Mouth/Throat:     Mouth: Mucous membranes are moist.     Pharynx: Oropharynx is clear.  Eyes:     Extraocular Movements: Extraocular movements intact.     Conjunctiva/sclera: Conjunctivae normal.     Pupils: Pupils are equal, round, and reactive to light.  Cardiovascular:     Rate and Rhythm: Normal rate and regular rhythm.     Heart sounds: Normal heart sounds.  Pulmonary:     Effort: Pulmonary effort is normal.     Breath sounds: Normal breath sounds.  Abdominal:     General: Bowel sounds are normal.     Palpations: Abdomen is soft.     Tenderness: There is no abdominal tenderness.  Musculoskeletal: Normal range of motion.  Skin:    General: Skin is warm and dry.  Neurological:     Mental Status: She is alert and oriented to person, place, and time.     Cranial Nerves: No cranial nerve deficit.     Deep Tendon Reflexes: Reflexes normal.  Psychiatric:        Mood and Affect: Mood normal.           Assessment & Plan:

## 2019-07-10 NOTE — Assessment & Plan Note (Signed)
Following with GYN, doing well on estradiol cream. Continue same.

## 2019-08-21 ENCOUNTER — Other Ambulatory Visit: Payer: Medicare Other

## 2019-08-27 ENCOUNTER — Other Ambulatory Visit (INDEPENDENT_AMBULATORY_CARE_PROVIDER_SITE_OTHER): Payer: Medicare Other

## 2019-08-27 ENCOUNTER — Other Ambulatory Visit: Payer: Self-pay

## 2019-08-27 DIAGNOSIS — E785 Hyperlipidemia, unspecified: Secondary | ICD-10-CM

## 2019-08-27 LAB — LIPID PANEL
Cholesterol: 162 mg/dL (ref 0–200)
HDL: 72.4 mg/dL (ref >39.00)
LDL Cholesterol: 76 mg/dL (ref 0–99)
NonHDL: 89.34
Total CHOL/HDL Ratio: 2
Triglycerides: 65 mg/dL (ref 0.0–149.0)
VLDL: 13 mg/dL (ref 0.0–40.0)

## 2019-08-27 LAB — HEPATIC FUNCTION PANEL
ALT: 20 U/L (ref 0–35)
AST: 26 U/L (ref 0–37)
Albumin: 4.4 g/dL (ref 3.5–5.2)
Alkaline Phosphatase: 32 U/L — ABNORMAL LOW (ref 39–117)
Bilirubin, Direct: 0.1 mg/dL (ref 0.0–0.3)
Total Bilirubin: 0.5 mg/dL (ref 0.2–1.2)
Total Protein: 6.5 g/dL (ref 6.0–8.3)

## 2019-09-03 ENCOUNTER — Telehealth: Payer: Self-pay

## 2019-09-03 NOTE — Telephone Encounter (Signed)
Pt has not had Covid. Pt scheduled for first Covid vaccine 09-05-19.  She states she is getting the Moderna vaccine and her 2d dose would be 4 weeks later.  What is recommended date for her to do her Prolia that is due 10-06-19?

## 2019-09-03 NOTE — Telephone Encounter (Signed)
Discussed Prolia Benefits w/pt.  Pt has MCR and supp.  Pt would owe approximately $0.  Pt understands and agrees.

## 2019-09-03 NOTE — Telephone Encounter (Signed)
I recommend delaying her Prolia injection four weeks from her second Covid-19 vaccine. Thanks!

## 2019-09-04 ENCOUNTER — Telehealth: Payer: Self-pay

## 2019-09-04 NOTE — Telephone Encounter (Signed)
Pt advised to wait 4 weeks after 2d Covid vaccination to do Prolia injection per 09-03-19 phone note.  Pt agrees.

## 2019-09-05 DIAGNOSIS — Z23 Encounter for immunization: Secondary | ICD-10-CM | POA: Diagnosis not present

## 2019-10-03 DIAGNOSIS — Z23 Encounter for immunization: Secondary | ICD-10-CM | POA: Diagnosis not present

## 2019-11-04 ENCOUNTER — Telehealth: Payer: Self-pay

## 2019-11-04 NOTE — Telephone Encounter (Signed)
Pt had 2d Covid vaccination 10-03-19 w/no problems.  Pt was to wait 4 weeks after 2d vaccination before having next Prolia injection.  Pt was due 10-06-19. Benefits discussed previously w/pt.  Pt would owe approximately $0 after insurance.

## 2019-11-05 NOTE — Telephone Encounter (Signed)
Contacted pt and advised labs are needed before injection. Pt labs are 3/17 and pt requested to have injection one week later instead of 4 wks later. Pt scheduled for injection on 3/23.  Advised this would be ok as long as pt's labs came back normal. Pt verbalized understanding.

## 2019-11-06 ENCOUNTER — Other Ambulatory Visit: Payer: Self-pay | Admitting: Primary Care

## 2019-11-06 ENCOUNTER — Other Ambulatory Visit: Payer: Self-pay

## 2019-11-06 ENCOUNTER — Other Ambulatory Visit (INDEPENDENT_AMBULATORY_CARE_PROVIDER_SITE_OTHER): Payer: Medicare Other

## 2019-11-06 DIAGNOSIS — M810 Age-related osteoporosis without current pathological fracture: Secondary | ICD-10-CM

## 2019-11-06 LAB — BASIC METABOLIC PANEL
BUN: 12 mg/dL (ref 6–23)
CO2: 30 mEq/L (ref 19–32)
Calcium: 9.5 mg/dL (ref 8.4–10.5)
Chloride: 101 mEq/L (ref 96–112)
Creatinine, Ser: 0.65 mg/dL (ref 0.40–1.20)
GFR: 88.87 mL/min (ref >60.00)
Glucose, Bld: 102 mg/dL — ABNORMAL HIGH (ref 70–99)
Potassium: 3.9 mEq/L (ref 3.5–5.1)
Sodium: 138 mEq/L (ref 135–145)

## 2019-11-12 ENCOUNTER — Ambulatory Visit (INDEPENDENT_AMBULATORY_CARE_PROVIDER_SITE_OTHER): Payer: Medicare Other

## 2019-11-12 ENCOUNTER — Other Ambulatory Visit: Payer: Self-pay

## 2019-11-12 DIAGNOSIS — M810 Age-related osteoporosis without current pathological fracture: Secondary | ICD-10-CM | POA: Diagnosis not present

## 2019-11-12 MED ORDER — DENOSUMAB 60 MG/ML ~~LOC~~ SOSY
60.0000 mg | PREFILLED_SYRINGE | Freq: Once | SUBCUTANEOUS | Status: AC
  Administered 2019-11-12: 60 mg via SUBCUTANEOUS

## 2019-11-12 NOTE — Progress Notes (Signed)
Per orders of Allie Bossier, NP , injection of Prolia 60mg  per ML to left arm given by Judeth Horn - Ashtyn Green. Patient tolerated injection well.

## 2019-12-24 DIAGNOSIS — H401122 Primary open-angle glaucoma, left eye, moderate stage: Secondary | ICD-10-CM | POA: Diagnosis not present

## 2020-05-02 ENCOUNTER — Ambulatory Visit (INDEPENDENT_AMBULATORY_CARE_PROVIDER_SITE_OTHER): Payer: Medicare Other

## 2020-05-02 ENCOUNTER — Other Ambulatory Visit: Payer: Self-pay

## 2020-05-02 DIAGNOSIS — Z23 Encounter for immunization: Secondary | ICD-10-CM | POA: Diagnosis not present

## 2020-05-25 ENCOUNTER — Telehealth: Payer: Self-pay

## 2020-05-25 NOTE — Telephone Encounter (Signed)
Benefits submitted on [employer].

## 2020-06-07 ENCOUNTER — Other Ambulatory Visit: Payer: Self-pay | Admitting: Primary Care

## 2020-06-07 DIAGNOSIS — E785 Hyperlipidemia, unspecified: Secondary | ICD-10-CM

## 2020-06-10 ENCOUNTER — Other Ambulatory Visit: Payer: Self-pay | Admitting: Primary Care

## 2020-06-10 DIAGNOSIS — Z1231 Encounter for screening mammogram for malignant neoplasm of breast: Secondary | ICD-10-CM

## 2020-06-19 ENCOUNTER — Other Ambulatory Visit: Payer: Self-pay

## 2020-06-19 DIAGNOSIS — Z79899 Other long term (current) drug therapy: Secondary | ICD-10-CM

## 2020-06-19 DIAGNOSIS — M810 Age-related osteoporosis without current pathological fracture: Secondary | ICD-10-CM

## 2020-06-19 NOTE — Telephone Encounter (Signed)
  Benefits submitted: Yes  Prior authorization needed: No  Out of pocket for patient:  No out of pocket   Lab appointment : L/m to call to make app   Nurse visit:  L/m to call to make app  Lab order placed: Yes

## 2020-06-19 NOTE — Telephone Encounter (Signed)
Pt called back states she was told that prolia needed to be 1 month out from Covid booster. She has app for that on 11/16.   Has CPE labs on 07/28/2020 and in office CPE on 07/31/2020. We should make a nurse visit on or after 12/16 correct?

## 2020-06-19 NOTE — Telephone Encounter (Signed)
Yes, this would be correct, after 08/06/20 for the injection

## 2020-06-20 NOTE — Telephone Encounter (Signed)
Okay to move her CPE to the 16th if that is more convenient.

## 2020-06-22 NOTE — Telephone Encounter (Signed)
Pt returned your call Best number [phone]

## 2020-06-22 NOTE — Telephone Encounter (Signed)
Pt returned your call.  

## 2020-06-22 NOTE — Telephone Encounter (Signed)
Left message to return call to our office.  

## 2020-06-23 NOTE — Telephone Encounter (Signed)
Left message to return call to our office.  

## 2020-06-23 NOTE — Telephone Encounter (Signed)
App for CPE and prolia have been moved to 12/17 with patient.

## 2020-06-24 DIAGNOSIS — H401122 Primary open-angle glaucoma, left eye, moderate stage: Secondary | ICD-10-CM | POA: Diagnosis not present

## 2020-06-26 NOTE — Telephone Encounter (Signed)
Noted. This is fine.

## 2020-06-30 DIAGNOSIS — H401122 Primary open-angle glaucoma, left eye, moderate stage: Secondary | ICD-10-CM | POA: Diagnosis not present

## 2020-07-05 ENCOUNTER — Other Ambulatory Visit: Payer: Self-pay | Admitting: Obstetrics and Gynecology

## 2020-07-05 DIAGNOSIS — N952 Postmenopausal atrophic vaginitis: Secondary | ICD-10-CM

## 2020-07-07 DIAGNOSIS — Z23 Encounter for immunization: Secondary | ICD-10-CM | POA: Diagnosis not present

## 2020-07-14 ENCOUNTER — Ambulatory Visit
Admission: RE | Admit: 2020-07-14 | Discharge: 2020-07-14 | Disposition: A | Discharge status: Home / Self Care | Payer: Medicare Other | Source: Ambulatory Visit | Attending: Primary Care | Admitting: Primary Care

## 2020-07-14 ENCOUNTER — Other Ambulatory Visit: Payer: Self-pay

## 2020-07-14 DIAGNOSIS — Z1231 Encounter for screening mammogram for malignant neoplasm of breast: Secondary | ICD-10-CM | POA: Diagnosis not present

## 2020-07-23 ENCOUNTER — Other Ambulatory Visit: Payer: Self-pay | Admitting: Primary Care

## 2020-07-23 DIAGNOSIS — R7303 Prediabetes: Secondary | ICD-10-CM

## 2020-07-23 DIAGNOSIS — E785 Hyperlipidemia, unspecified: Secondary | ICD-10-CM

## 2020-07-24 ENCOUNTER — Ambulatory Visit (INDEPENDENT_AMBULATORY_CARE_PROVIDER_SITE_OTHER): Payer: Medicare Other

## 2020-07-24 ENCOUNTER — Other Ambulatory Visit: Payer: Self-pay

## 2020-07-24 DIAGNOSIS — Z0000 Encounter for general adult medical examination without abnormal findings: Secondary | ICD-10-CM | POA: Diagnosis not present

## 2020-07-24 NOTE — Progress Notes (Signed)
PCP notes:  Health Maintenance: Dexa- due   Abnormal Screenings: none   Patient concerns: Right hip pain issues Discuss benefits of Estrace cream    Nurse concerns: none   Next PCP appt.: 08/07/2020 @ 10 am

## 2020-07-24 NOTE — Patient Instructions (Signed)
Christy [name] , Thank you for taking time to come for your Medicare Wellness Visit. I appreciate your ongoing commitment to your health goals. Please review the following plan we discussed and let me know if I can assist you in the future.   Screening recommendations/referrals: Colonoscopy: Up to date, completed 02/12/2019, due 01/2029 Mammogram: Up to date, completed 07/14/2020, due 06/2021 Bone Density: due, Please call and schedule when provider places order Recommended yearly ophthalmology/optometry visit for glaucoma screening and checkup Recommended yearly dental visit for hygiene and checkup  Vaccinations: Influenza vaccine: Up to date, completed 05/02/2020, due 03/2021 Pneumococcal vaccine: Completed series Tdap vaccine: Up to date, completed 07/26/2017, due 07/2027 Shingles vaccine: Completed series   Covid-19:Completed series  Advanced directives: copy in chart  Conditions/risks identified: hyperlipidemia  Next appointment: Follow up in one year for your annual wellness visit    Preventive Care 60 Years and Older, Female Preventive care refers to lifestyle choices and visits with your health care provider that can promote health and wellness. What does preventive care include?  A yearly physical exam. This is also called an annual well check.  Dental exams once or twice a year.  Routine eye exams. Ask your health care provider how often you should have your eyes checked.  Personal lifestyle choices, including:  Daily care of your teeth and gums.  Regular physical activity.  Eating a healthy diet.  Avoiding tobacco and drug use.  Limiting alcohol use.  Practicing safe sex.  Taking low-dose aspirin every day.  Taking vitamin and mineral supplements as recommended by your health care provider. What happens during an annual well check? The services and screenings done by your health care provider during your annual well check will depend on your age, overall  health, lifestyle risk factors, and family history of disease. Counseling  Your health care provider may ask you questions about your:  Alcohol use.  Tobacco use.  Drug use.  Emotional well-being.  Home and relationship well-being.  Sexual activity.  Eating habits.  History of falls.  Memory and ability to understand (cognition).  Work and work Statistician.  Reproductive health. Screening  You may have the following tests or measurements:  Height, weight, and BMI.  Blood pressure.  Lipid and cholesterol levels. These may be checked every 5 years, or more frequently if you are over 73 years old.  Skin check.  Lung cancer screening. You may have this screening every year starting at age 8 if you have a 30-pack-year history of smoking and currently smoke or have quit within the past 15 years.  Fecal occult blood test (FOBT) of the stool. You may have this test every year starting at age 59.  Flexible sigmoidoscopy or colonoscopy. You may have a sigmoidoscopy every 5 years or a colonoscopy every 10 years starting at age 52.  Hepatitis C blood test.  Hepatitis B blood test.  Sexually transmitted disease (STD) testing.  Diabetes screening. This is done by checking your blood sugar (glucose) after you have not eaten for a while (fasting). You may have this done every 1-3 years.  Bone density scan. This is done to screen for osteoporosis. You may have this done starting at age 37.  Mammogram. This may be done every 1-2 years. Talk to your health care provider about how often you should have regular mammograms. Talk with your health care provider about your test results, treatment options, and if necessary, the need for more tests. Vaccines  Your health care provider may  recommend certain vaccines, such as:  Influenza vaccine. This is recommended every year.  Tetanus, diphtheria, and acellular pertussis (Tdap, Td) vaccine. You may need a Td booster every 10  years.  Zoster vaccine. You may need this after age 19.  Pneumococcal 13-valent conjugate (PCV13) vaccine. One dose is recommended after age 68.  Pneumococcal polysaccharide (PPSV23) vaccine. One dose is recommended after age 60. Talk to your health care provider about which screenings and vaccines you need and how often you need them. This information is not intended to replace advice given to you by your health care provider. Make sure you discuss any questions you have with your health care provider. Document Released: 09/04/2015 Document Revised: 04/27/2016 Document Reviewed: 06/09/2015 Elsevier Interactive Patient Education  2017 [place] Prevention in the Home Falls can cause injuries. They can happen to people of all ages. There are many things you can do to make your home safe and to help prevent falls. What can I do on the outside of my home?  Regularly fix the edges of walkways and driveways and fix any cracks.  Remove anything that might make you trip as you walk through a door, such as a raised step or threshold.  Trim any bushes or trees on the path to your home.  Use bright outdoor lighting.  Clear any walking paths of anything that might make someone trip, such as rocks or tools.  Regularly check to see if handrails are loose or broken. Make sure that both sides of any steps have handrails.  Any raised decks and porches should have guardrails on the edges.  Have any leaves, snow, or ice cleared regularly.  Use sand or salt on walking paths during winter.  Clean up any spills in your garage right away. This includes oil or grease spills. What can I do in the bathroom?  Use night lights.  Install grab bars by the toilet and in the tub and shower. Do not use towel bars as grab bars.  Use non-skid mats or decals in the tub or shower.  If you need to sit down in the shower, use a plastic, non-slip stool.  Keep the floor dry. Clean up any water that  spills on the floor as soon as it happens.  Remove soap buildup in the tub or shower regularly.  Attach bath mats securely with double-sided non-slip rug tape.  Do not have throw rugs and other things on the floor that can make you trip. What can I do in the bedroom?  Use night lights.  Make sure that you have a light by your bed that is easy to reach.  Do not use any sheets or blankets that are too big for your bed. They should not hang down onto the floor.  Have a firm chair that has side arms. You can use this for support while you get dressed.  Do not have throw rugs and other things on the floor that can make you trip. What can I do in the kitchen?  Clean up any spills right away.  Avoid walking on wet floors.  Keep items that you use a lot in easy-to-reach places.  If you need to reach something above you, use a strong step stool that has a grab bar.  Keep electrical cords out of the way.  Do not use floor polish or wax that makes floors slippery. If you must use wax, use non-skid floor wax.  Do not have throw rugs and  other things on the floor that can make you trip. What can I do with my stairs?  Do not leave any items on the stairs.  Make sure that there are handrails on both sides of the stairs and use them. Fix handrails that are broken or loose. Make sure that handrails are as long as the stairways.  Check any carpeting to make sure that it is firmly attached to the stairs. Fix any carpet that is loose or worn.  Avoid having throw rugs at the top or bottom of the stairs. If you do have throw rugs, attach them to the floor with carpet tape.  Make sure that you have a light switch at the top of the stairs and the bottom of the stairs. If you do not have them, ask someone to add them for you. What else can I do to help prevent falls?  Wear shoes that:  Do not have high heels.  Have rubber bottoms.  Are comfortable and fit you well.  Are closed at the  toe. Do not wear sandals.  If you use a stepladder:  Make sure that it is fully opened. Do not climb a closed stepladder.  Make sure that both sides of the stepladder are locked into place.  Ask someone to hold it for you, if possible.  Clearly mark and make sure that you can see:  Any grab bars or handrails.  First and last steps.  Where the edge of each step is.  Use tools that help you move around (mobility aids) if they are needed. These include:  Canes.  Walkers.  Scooters.  Crutches.  Turn on the lights when you go into a dark area. Replace any light bulbs as soon as they burn out.  Set up your furniture so you have a clear path. Avoid moving your furniture around.  If any of your floors are uneven, fix them.  If there are any pets around you, be aware of where they are.  Review your medicines with your doctor. Some medicines can make you feel dizzy. This can increase your chance of falling. Ask your doctor what other things that you can do to help prevent falls. This information is not intended to replace advice given to you by your health care provider. Make sure you discuss any questions you have with your health care provider. Document Released: 06/04/2009 Document Revised: 01/14/2016 Document Reviewed: 09/12/2014 Elsevier Interactive Patient Education  2017 [employer].

## 2020-07-24 NOTE — Progress Notes (Addendum)
Subjective:   Christy [name] is a 74 y.o. female who presents for Medicare Annual (Subsequent) preventive examination.  Review of Systems: N/A     I connected with the patient today by telephone and verified that I am speaking with the correct person using two identifiers. Location patient: home Location nurse: work Persons participating in the telephone visit: patient, nurse.   I discussed the limitations, risks, security and privacy concerns of performing an evaluation and management service by telephone and the availability of in person appointments. I also discussed with the patient that there may be a patient responsible charge related to this service. The patient expressed understanding and verbally consented to this telephonic visit.        Cardiac Risk Factors include: advanced age (>26men, >87 women);Other (see comment), Risk factor comments: hyperlipidemia     Objective:    Today's Vitals   There is no height or weight on file to calculate BMI.  Advanced Directives 07/24/2020 07/03/2019 02/12/2019 06/22/2018 02/12/2018 02/05/2018 10/05/2016  Does Patient Have a Medical Advance Directive? Yes Yes Yes Yes Yes Yes Yes  Type of Paramedic of [place];Living will [place];Living will [place];Living will;Out of facility DNR (pink MOST or yellow form) [place];Living will [place];Living will - Living will  Does patient want to make changes to medical advance directive? - - - - No - Patient declined - -  Copy of [place] in Chart? Yes - validated most recent copy scanned in chart (See row information) Yes - validated most recent copy scanned in chart (See row information) Yes - validated most recent copy scanned in chart (See row information) Yes Yes - -    Current Medications (verified) Outpatient Encounter Medications as of 07/24/2020  Medication Sig  .  atorvastatin (LIPITOR) 10 MG tablet TAKE 1 TABLET BY MOUTH IN THE EVENING FOR CHOLESTEROL  . brimonidine (ALPHAGAN) 0.2 % ophthalmic solution Place 1 drop into the left eye 2 (two) times daily.   Marland Kitchen CALCIUM CARBONATE-VITAMIN D PO Take 1 tablet by mouth 2 (two) times daily.   . cholecalciferol (VITAMIN D) 1000 UNITS tablet Take 1,000 Units by mouth daily.  . Cranberry 500 MG CAPS Take 500 mg by mouth daily.   Marland Kitchen denosumab (PROLIA) 60 MG/ML SOLN injection Inject 60 mg into the skin every 6 (six) months. Administer in upper arm, thigh, or abdomen  . dorzolamide-timolol (COSOPT) 22.3-6.8 MG/ML ophthalmic solution Place 1 drop into the left eye every 12 (twelve) hours.  Marland Kitchen estradiol (ESTRACE) 0.1 MG/GM vaginal cream INSERT 1/2 GRAM VAGINALLY TWICE A WEEK  . GLUCOSAMINE CHONDROITIN COMPLX PO Take 1 tablet by mouth daily.   . Multiple Vitamin (MULTIVITAMIN) tablet Take 1 tablet by mouth daily.  . Omega-3 Fatty Acids (FISH OIL) 1200 MG CAPS Take 2,400 mg by mouth daily.   Marland Kitchen omeprazole (PRILOSEC) 40 MG capsule Take 40 mg by mouth daily.   No facility-administered encounter medications on file as of 07/24/2020.    Allergies (verified) Xalatan [latanoprost]   History: Past Medical History:  Diagnosis Date  . Arthritis   . Atrophy of vagina   . Cystocele   . GERD (gastroesophageal reflux disease)   . Glaucoma    left eye  . Heart murmur   . Rectocele   . Uveitis    left   Past Surgical History:  Procedure Laterality Date  . ABDOMINAL HYSTERECTOMY    .  CATARACT EXTRACTION, BILATERAL    . COLONOSCOPY WITH PROPOFOL N/A 02/12/2019   Procedure: COLONOSCOPY WITH PROPOFOL;  Surgeon: Lucilla Lame, MD;  Location: [facility] ENDOSCOPY;  Service: Endoscopy;  Laterality: N/A;  . ESOPHAGOGASTRODUODENOSCOPY (EGD) WITH PROPOFOL N/A 02/12/2019   Procedure: ESOPHAGOGASTRODUODENOSCOPY (EGD) WITH PROPOFOL;  Surgeon: Lucilla Lame, MD;  Location[facility] ENDOSCOPY;  Service: Endoscopy;  Laterality: N/A;  . EYE SURGERY  Bilateral    lens implant  . TUBAL LIGATION    . VAGINAL HYSTERECTOMY Bilateral 02/12/2018   Procedure: HYSTERECTOMY VAGINAL WITH BILATERAL SALPINGO OOPHERECTOMY;  Surgeon: Brayton Mars, MD;  Location: [facility] ORS;  Service: Gynecology;  Laterality: Bilateral;   Family History  Problem Relation Age of Onset  . Aortic stenosis Mother   . Heart disease Mother   . Osteoporosis Mother   . Cancer Father        prostate  . Heart disease Father   . Multiple sclerosis Daughter   . Breast cancer Maternal Aunt    Social History   Socioeconomic History  . Marital status: Widowed    Spouse name: Not on file  . Number of children: Not on file  . Years of education: Not on file  . Highest education level: Not on file  Occupational History  . Not on file  Tobacco Use  . Smoking status: Never Smoker  . Smokeless tobacco: Never Used  Vaping Use  . Vaping Use: Never used  Substance and Sexual Activity  . Alcohol use: Yes    Alcohol/week: 7.0 standard drinks    Types: 7 Glasses of wine per week    Comment: wine daily  . Drug use: No  . Sexual activity: Not Currently    Birth control/protection: Surgical  Other Topics Concern  . Not on file  Social History Narrative   Recently moved to [facility] from [place].   She is primary caregiver for her husband who has Alzheimers.   Two children.      Retired Engineer, production.      Desires CPR.   Has living will- SCANNED INTO CHART   Would not want prolonged life support if futile.                  Social Determinants of Health   Financial Resource Strain: Low Risk   . Difficulty of Paying Living Expenses: Not hard at all  Food Insecurity: No Food Insecurity  . Worried About Charity fundraiser in the Last Year: Never true  . Ran Out of Food in the Last Year: Never true  Transportation Needs: No Transportation Needs  . Lack of Transportation (Medical): No  . Lack of Transportation (Non-Medical): No  Physical  Activity: Insufficiently Active  . Days of Exercise per Week: 7 days  . Minutes of Exercise per Session: 20 min  Stress: No Stress Concern Present  . Feeling of Stress : Not at all  Social Connections:   . Frequency of Communication with Friends and Family: Not on file  . Frequency of Social Gatherings with Friends and Family: Not on file  . Attends Religious Services: Not on file  . Active Member of Clubs or Organizations: Not on file  . Attends Archivist Meetings: Not on file  . Marital Status: Not on file    Tobacco Counseling Counseling given: Not Answered   Clinical Intake:  Pre-visit preparation completed: Yes  Pain : 0-10 Pain Type: Acute pain Pain Location: Hip Pain Orientation: Right Pain Descriptors /  Indicators: Aching Pain Onset: More than a month ago Pain Frequency: Intermittent     Nutritional Risks: None Diabetes: No  How often do you need to have someone help you when you read instructions, pamphlets, or other written materials from your doctor or pharmacy?: 1 - Never What is the last grade level you completed in school?: college graduate  Diabetic: No Nutrition Risk Assessment:  Has the patient had any N/V/D within the last 2 months?  No  Does the patient have any non-healing wounds?  No  Has the patient had any unintentional weight loss or weight gain?  No   Diabetes:  Is the patient diabetic?  No  If diabetic, was a CBG obtained today?  N/A Did the patient bring in their glucometer from home?  N/A How often do you monitor your CBG's? N/A.   Financial Strains and Diabetes Management:  Are you having any financial strains with the device, your supplies or your medication? N/A.  Does the patient want to be seen by Chronic Care Management for management of their diabetes?  N/A Would the patient like to be referred to a Nutritionist or for Diabetic Management?  N/A    Interpreter Needed?: No  Information entered by :: CJohnson,  LPN   Activities of Daily Living In your present state of health, do you have any difficulty performing the following activities: 07/24/2020  Hearing? N  Vision? N  Difficulty concentrating or making decisions? N  Walking or climbing stairs? N  Dressing or bathing? N  Doing errands, shopping? N  Preparing Food and eating ? N  Using the Toilet? N  In the past six months, have you accidently leaked urine? N  Do you have problems with loss of bowel control? N  Managing your Medications? N  Managing your Finances? N  Housekeeping or managing your Housekeeping? N  Some recent data might be hidden    Patient Care Team: Pleas Koch, NP as PCP - General (Internal Medicine) Leandrew Koyanagi, MD as Referring Physician (Ophthalmology) Defrancesco, Alanda Slim, MD as Consulting Physician (Obstetrics and Gynecology)  Indicate any recent Medical Services you may have received from other than [facility] providers in the past year (date may be approximate).     Assessment:   This is a routine wellness examination for Rosaura.  Hearing/Vision screen  Hearing Screening   125Hz  250Hz  500Hz  1000Hz  2000Hz  3000Hz  4000Hz  6000Hz  8000Hz   Right ear:           Left ear:           Vision Screening Comments: Patient gets annual eye exams  Dietary issues and exercise activities discussed: Current Exercise Habits: Home exercise routine, Type of exercise: walking, Time (Minutes): 20, Frequency (Times/Week): 7, Weekly Exercise (Minutes/Week): 140, Intensity: Moderate, Exercise limited by: None identified  Goals    . Increase physical activity     Starting 06/22/2018, I will continue to walk at least 1 mile daily.     . Patient Stated     07/03/2019, I will maintain and continue medications as prescribed.     . Patient Stated     07/24/2020, I will continue to walk everyday for 20-30 minutes.      Depression Screen PHQ 2/9 Scores 07/24/2020 07/03/2019 06/22/2018 06/09/2016 06/09/2015 06/03/2014  05/06/2013  PHQ - 2 Score 0 0 0 0 0 0 0  PHQ- 9 Score 0 0 0 - - - -    Fall Risk Fall Risk  07/24/2020 07/03/2019 06/22/2018 06/09/2016  06/09/2015  Falls in the past year? 0 0 0 No No  Number falls in past yr: 0 0 - - -  Injury with Fall? 0 0 - - -  Risk for fall due to : No Fall Risks - - - -  Follow up Falls evaluation completed;Falls prevention discussed Falls evaluation completed;Falls prevention discussed - - -    Any stairs in or around the home? Yes  If so, are there any without handrails? No  Home free of loose throw rugs in walkways, pet beds, electrical cords, etc? Yes  Adequate lighting in your home to reduce risk of falls? Yes   ASSISTIVE DEVICES UTILIZED TO PREVENT FALLS:  Life alert? No  Use of a cane, walker or w/c? No  Grab bars in the bathroom? No  Shower chair or bench in shower? No  Elevated toilet seat or a handicapped toilet? No   TIMED UP AND GO:  Was the test performed? N/A, telephone visit.  Cognitive Function: MMSE - Mini Mental State Exam 07/24/2020 07/03/2019 06/22/2018  Orientation to time 5 5 5   Orientation to Place 5 5 5   Registration 3 3 3   Attention/ Calculation 5 5 0  Recall 3 3 3   Language- name 2 objects - - 0  Language- repeat 1 1 1   Language- follow 3 step command - - 3  Language- read & follow direction - - 0  Write a sentence - - 0  Copy design - - 0  Total score - - 20  Mini Cog  Mini-Cog screen was completed. Maximum score is 22. A value of 0 denotes this part of the MMSE was not completed or the patient failed this part of the Mini-Cog screening.       Immunizations Immunization History  Administered Date(s) Administered  . Fluad Quad(high Dose 65+) 05/21/2019, 05/02/2020  . Influenza, Seasonal, Injecte, Preservative Fre 06/29/2006, 07/04/2007, 06/24/2008, 05/28/2009, 06/14/2010, 06/03/2011, 05/28/2012  . Influenza,inj,Quad PF,6+ Mos 05/06/2013, 05/20/2014, 06/09/2015, 06/15/2017, 06/22/2018  . Moderna SARS-COVID-2  Vaccination 09/05/2019, 10/03/2019  . Pneumococcal Conjugate-13 07/10/2014  . Pneumococcal Polysaccharide-23 01/05/2010  . Pneumococcal-Unspecified 01/05/2010  . Td 08/22/1996, 12/21/2006, 07/26/2017  . Tdap 12/21/2006  . Zoster 02/24/2006  . Zoster Recombinat (Shingrix) 03/29/2018, 07/04/2018    TDAP status: Up to date Flu Vaccine status: Up to date Pneumococcal vaccine status: Up to date Covid-19 vaccine status: Completed vaccines  Qualifies for Shingles Vaccine? Yes   Zostavax completed Yes   Shingrix Completed?: Yes  Screening Tests Health Maintenance  Topic Date Due  . TETANUS/TDAP  07/27/2027  . COLONOSCOPY  02/11/2029  . INFLUENZA VACCINE  Completed  . DEXA SCAN  Completed  . COVID-19 Vaccine  Completed  . Hepatitis C Screening  Completed  . PNA vac Low Risk Adult  Completed    Health Maintenance  There are no preventive care reminders to display for this patient.  Colorectal cancer screening: Completed 02/12/2019. Repeat every 10 years Mammogram status: Completed 07/14/2020. Repeat every year Bone Density status: due, will call and schedule when provider places order  Lung Cancer Screening: (Low Dose CT Chest recommended if Age 9-80 years, 30 pack-year currently smoking OR have quit w/in 15years.) does not qualify.     Additional Screening:  Hepatitis C Screening: does qualify; Completed 06/09/2016  Vision Screening: Recommended annual ophthalmology exams for early detection of glaucoma and other disorders of the eye. Is the patient up to date with their annual eye exam?  Yes  Who is the provider or  what is the name of the office in which the patient attends annual eye exams? Dr. Wallace Going, [facility]  If pt is not established with a provider, would they like to be referred to a provider to establish care? No .   Dental Screening: Recommended annual dental exams for proper oral hygiene  Community Resource Referral / Chronic Care Management: CRR  required this visit?  No   CCM required this visit?  No      Plan:     I have personally reviewed and noted the following in the patient's chart:   . Medical and social history . Use of alcohol, tobacco or illicit drugs  . Current medications and supplements . Functional ability and status . Nutritional status . Physical activity . Advanced directives . List of other physicians . Hospitalizations, surgeries, and ER visits in previous 12 months . Vitals . Screenings to include cognitive, depression, and falls . Referrals and appointments  In addition, I have reviewed and discussed with patient certain preventive protocols, quality metrics, and best practice recommendations. A written personalized care plan for preventive services as well as general preventive health recommendations were provided to patient.   Due to this being a telephonic visit, the after visit summary with patients personalized plan was offered to patient via office or my-chart. Patient preferred to pick up at office at next visit or via mychart.   Andrez Grime, LPN   [id number]

## 2020-07-27 ENCOUNTER — Ambulatory Visit: Payer: Medicare Other

## 2020-07-28 ENCOUNTER — Other Ambulatory Visit (INDEPENDENT_AMBULATORY_CARE_PROVIDER_SITE_OTHER): Payer: Medicare Other

## 2020-07-28 ENCOUNTER — Other Ambulatory Visit: Payer: Self-pay

## 2020-07-28 DIAGNOSIS — E785 Hyperlipidemia, unspecified: Secondary | ICD-10-CM | POA: Diagnosis not present

## 2020-07-28 DIAGNOSIS — R7303 Prediabetes: Secondary | ICD-10-CM

## 2020-07-28 LAB — CBC
HCT: 41.4 % (ref 36.0–46.0)
Hemoglobin: 13.9 g/dL (ref 12.0–15.0)
MCHC: 33.5 g/dL (ref 30.0–36.0)
MCV: 97.3 fl (ref 78.0–100.0)
Platelets: 204 10*3/uL (ref 150.0–400.0)
RBC: 4.25 Mil/uL (ref 3.87–5.11)
RDW: 13.2 % (ref 11.5–15.5)
WBC: 4.8 10*3/uL (ref 4.0–10.5)

## 2020-07-28 LAB — COMPREHENSIVE METABOLIC PANEL
ALT: 23 U/L (ref 0–35)
AST: 27 U/L (ref 0–37)
Albumin: 4.3 g/dL (ref 3.5–5.2)
Alkaline Phosphatase: 30 U/L — ABNORMAL LOW (ref 39–117)
BUN: 11 mg/dL (ref 6–23)
CO2: 30 mEq/L (ref 19–32)
Calcium: 9.1 mg/dL (ref 8.4–10.5)
Chloride: 101 mEq/L (ref 96–112)
Creatinine, Ser: 0.65 mg/dL (ref 0.40–1.20)
GFR: 85.96 mL/min (ref >60.00)
Glucose, Bld: 88 mg/dL (ref 70–99)
Potassium: 4.1 mEq/L (ref 3.5–5.1)
Sodium: 138 mEq/L (ref 135–145)
Total Bilirubin: 0.6 mg/dL (ref 0.2–1.2)
Total Protein: 6.4 g/dL (ref 6.0–8.3)

## 2020-07-28 LAB — LIPID PANEL
Cholesterol: 174 mg/dL (ref 0–200)
HDL: 78.1 mg/dL (ref >39.00)
LDL Cholesterol: 86 mg/dL (ref 0–99)
NonHDL: 95.77
Total CHOL/HDL Ratio: 2
Triglycerides: 50 mg/dL (ref 0.0–149.0)
VLDL: 10 mg/dL (ref 0.0–40.0)

## 2020-07-28 LAB — HEMOGLOBIN A1C: Hgb A1c MFr Bld: 5.8 % (ref 4.6–6.5)

## 2020-07-31 ENCOUNTER — Ambulatory Visit: Payer: Medicare Other | Admitting: Primary Care

## 2020-08-07 ENCOUNTER — Other Ambulatory Visit: Payer: Self-pay

## 2020-08-07 ENCOUNTER — Ambulatory Visit (INDEPENDENT_AMBULATORY_CARE_PROVIDER_SITE_OTHER): Payer: Medicare Other | Admitting: Primary Care

## 2020-08-07 ENCOUNTER — Ambulatory Visit (INDEPENDENT_AMBULATORY_CARE_PROVIDER_SITE_OTHER)
Admission: RE | Admit: 2020-08-07 | Discharge: 2020-08-07 | Disposition: A | Discharge status: Home / Self Care | Payer: Medicare Other | Source: Ambulatory Visit | Attending: Primary Care | Admitting: Primary Care

## 2020-08-07 ENCOUNTER — Encounter: Payer: Self-pay | Admitting: Primary Care

## 2020-08-07 VITALS — BP 124/72 | HR 84 | Temp 97.8°F | Ht 60.0 in | Wt 126.0 lb

## 2020-08-07 DIAGNOSIS — H4089 Other specified glaucoma: Secondary | ICD-10-CM | POA: Diagnosis not present

## 2020-08-07 DIAGNOSIS — E785 Hyperlipidemia, unspecified: Secondary | ICD-10-CM

## 2020-08-07 DIAGNOSIS — M810 Age-related osteoporosis without current pathological fracture: Secondary | ICD-10-CM

## 2020-08-07 DIAGNOSIS — E2839 Other primary ovarian failure: Secondary | ICD-10-CM | POA: Diagnosis not present

## 2020-08-07 DIAGNOSIS — N952 Postmenopausal atrophic vaginitis: Secondary | ICD-10-CM

## 2020-08-07 DIAGNOSIS — M25551 Pain in right hip: Secondary | ICD-10-CM | POA: Diagnosis not present

## 2020-08-07 DIAGNOSIS — G8929 Other chronic pain: Secondary | ICD-10-CM | POA: Diagnosis not present

## 2020-08-07 DIAGNOSIS — R0982 Postnasal drip: Secondary | ICD-10-CM | POA: Insufficient documentation

## 2020-08-07 DIAGNOSIS — K219 Gastro-esophageal reflux disease without esophagitis: Secondary | ICD-10-CM | POA: Diagnosis not present

## 2020-08-07 DIAGNOSIS — M1990 Unspecified osteoarthritis, unspecified site: Secondary | ICD-10-CM | POA: Diagnosis not present

## 2020-08-07 DIAGNOSIS — R7303 Prediabetes: Secondary | ICD-10-CM

## 2020-08-07 MED ORDER — DENOSUMAB 60 MG/ML ~~LOC~~ SOSY
60.0000 mg | PREFILLED_SYRINGE | Freq: Once | SUBCUTANEOUS | Status: AC
  Administered 2020-08-07: 11:00:00 60 mg via SUBCUTANEOUS

## 2020-08-07 NOTE — Assessment & Plan Note (Signed)
Chronic with drainage and voice hoarseness.  Discussed trial of antihistamine, recommended Zyrtec HS. She will update.

## 2020-08-07 NOTE — Progress Notes (Signed)
Subjective:    Patient ID: Christy [name], female    DOB: 13-Dec-1945, 74 y.o.   MRN: [id number]  HPI  This visit occurred during the SARS-CoV-2 public health emergency.  Safety protocols were in place, including screening questions prior to the visit, additional usage of staff PPE, and extensive cleaning of exam room while observing appropriate contact time as indicated for disinfecting solutions.   Christy [name] is a 74 year old female who presents today for Uncertain Part 2 and follow up of chronic conditions.  She would also like to discuss chronic right hip pain. This began in Summer 2021, sudden onset, but has been intermittent since. She notices her pain mostly when walking for prolonged distances, or when standing after seated for a prolonged period.   She's taken Ibuprofen and Tylenol at times with improvement. She doesn't take these medications daily. She's been doing home PT exercises for which she learned online. Overall she's noticed some improvement but is still bothered by these symptoms. She denies falls.   Ongoing post nasal drip with voice hoarseness for years, no improvement with omeprazole 40 mg. Evaluated by ENT in the past. She has not recently tried antihistamines for post nasal drip.    Immunizations: -Tetanus: Completed in 2018 -Influenza: Completed this season -Shingles: Completed -Pneumonia: Completed last in 2015 -Covid-19: Completed series   Mammogram: Completed in 2021 Dexa: Completed in 2019, on Prolia.  Colonoscopy: Completed in 2020, no further imaging needed. Hep C Screen: Negative   BP Readings from Last 3 Encounters:  08/07/20 124/72  07/10/19 116/70  07/09/19 (!) 148/89     Review of Systems  HENT: Positive for postnasal drip.   Respiratory: Negative for shortness of breath.   Cardiovascular: Negative for chest pain.  Musculoskeletal: Positive for arthralgias.  Neurological: Negative for dizziness and headaches.       Past Medical  History:  Diagnosis Date  . Arthritis   . Atrophy of vagina   . Cystocele   . GERD (gastroesophageal reflux disease)   . Glaucoma    left eye  . Heart murmur   . Rectocele   . Uveitis    left     Social History   Socioeconomic History  . Marital status: Widowed    Spouse name: Not on file  . Number of children: Not on file  . Years of education: Not on file  . Highest education level: Not on file  Occupational History  . Not on file  Tobacco Use  . Smoking status: Never Smoker  . Smokeless tobacco: Never Used  Vaping Use  . Vaping Use: Never used  Substance and Sexual Activity  . Alcohol use: Yes    Alcohol/week: 7.0 standard drinks    Types: 7 Glasses of wine per week    Comment: wine daily  . Drug use: No  . Sexual activity: Not Currently    Birth control/protection: Surgical  Other Topics Concern  . Not on file  Social History Narrative   Recently moved to [facility] from [place].   She is primary caregiver for her husband who has Alzheimers.   Two children.      Retired Engineer, production.      Desires CPR.   Has living will- SCANNED INTO CHART   Would not want prolonged life support if futile.                  Social Determinants of Health   Financial Resource Strain:  Low Risk   . Difficulty of Paying Living Expenses: Not hard at all  Food Insecurity: No Food Insecurity  . Worried About Charity fundraiser in the Last Year: Never true  . Ran Out of Food in the Last Year: Never true  Transportation Needs: No Transportation Needs  . Lack of Transportation (Medical): No  . Lack of Transportation (Non-Medical): No  Physical Activity: Insufficiently Active  . Days of Exercise per Week: 7 days  . Minutes of Exercise per Session: 20 min  Stress: No Stress Concern Present  . Feeling of Stress : Not at all  Social Connections: Not on file  Intimate Partner Violence: Not At Risk  . Fear of Current or Ex-Partner: No  . Emotionally Abused: No  .  Physically Abused: No  . Sexually Abused: No    Past Surgical History:  Procedure Laterality Date  . ABDOMINAL HYSTERECTOMY    . CATARACT EXTRACTION, BILATERAL    . COLONOSCOPY WITH PROPOFOL N/A 02/12/2019   Procedure: COLONOSCOPY WITH PROPOFOL;  Surgeon: Lucilla Lame, MD;  Location: [facility] ENDOSCOPY;  Service: Endoscopy;  Laterality: N/A;  . ESOPHAGOGASTRODUODENOSCOPY (EGD) WITH PROPOFOL N/A 02/12/2019   Procedure: ESOPHAGOGASTRODUODENOSCOPY (EGD) WITH PROPOFOL;  Surgeon: Lucilla Lame, MD;  Location: [facility] ENDOSCOPY;  Service: Endoscopy;  Laterality: N/A;  . EYE SURGERY Bilateral    lens implant  . TUBAL LIGATION    . VAGINAL HYSTERECTOMY Bilateral 02/12/2018   Procedure: HYSTERECTOMY VAGINAL WITH BILATERAL SALPINGO OOPHERECTOMY;  Surgeon: Brayton Mars, MD;  Location: [facility] ORS;  Service: Gynecology;  Laterality: Bilateral;    Family History  Problem Relation Age of Onset  . Aortic stenosis Mother   . Heart disease Mother   . Osteoporosis Mother   . Cancer Father        prostate  . Heart disease Father   . Multiple sclerosis Daughter   . Breast cancer Maternal Aunt     Allergies  Allergen Reactions  . Xalatan [Latanoprost] Itching, Swelling and Other (See Comments)    Caused eye to be red, swollen,itchy    Current Outpatient Medications on File Prior to Visit  Medication Sig Dispense Refill  . atorvastatin (LIPITOR) 10 MG tablet TAKE 1 TABLET BY MOUTH IN THE EVENING FOR CHOLESTEROL 90 tablet 1  . brimonidine (ALPHAGAN) 0.2 % ophthalmic solution Place 1 drop into the left eye 2 (two) times daily.     Marland Kitchen CALCIUM CARBONATE-VITAMIN D PO Take 1 tablet by mouth 2 (two) times daily.     . cholecalciferol (VITAMIN D) 1000 UNITS tablet Take 1,000 Units by mouth daily.    . Cranberry 500 MG CAPS Take 500 mg by mouth daily.     Marland Kitchen denosumab (PROLIA) 60 MG/ML SOLN injection Inject 60 mg into the skin every 6 (six) months. Administer in upper arm, thigh, or abdomen    .  dorzolamide-timolol (COSOPT) 22.3-6.8 MG/ML ophthalmic solution Place 1 drop into the left eye every 12 (twelve) hours.    Marland Kitchen estradiol (ESTRACE) 0.1 MG/GM vaginal cream INSERT 1/2 GRAM VAGINALLY TWICE A WEEK 43 g 0  . GLUCOSAMINE CHONDROITIN COMPLX PO Take 1 tablet by mouth daily.     . Multiple Vitamin (MULTIVITAMIN) tablet Take 1 tablet by mouth daily.    . Omega-3 Fatty Acids (FISH OIL) 1200 MG CAPS Take 2,400 mg by mouth daily.      No current facility-administered medications on file prior to visit.    BP 124/72   Pulse 84   Temp 97.8  F (36.6 C) (Temporal)   Ht 5' (1.524 m)   Wt 126 lb (57.2 kg)   SpO2 98%   BMI 24.61 kg/m    Objective:   Physical Exam Constitutional:      Appearance: She is well-nourished.  Cardiovascular:     Rate and Rhythm: Normal rate and regular rhythm.  Pulmonary:     Effort: Pulmonary effort is normal.     Breath sounds: Normal breath sounds.  Musculoskeletal:     Cervical back: Neck supple.     Comments: Slight decrease in ROM due to pain with internal hip rotation while supine. Ambulates well in the office.  Skin:    General: Skin is warm and dry.  Psychiatric:        Mood and Affect: Mood and affect normal.            Assessment & Plan:

## 2020-08-07 NOTE — Assessment & Plan Note (Signed)
Due for Prolia injection today, also overdue for bone density testing.  Orders placed for bone density scan. Creatinine and calcium unremarkable from recent labs.  She will see our nurse today for Prolia injection.

## 2020-08-07 NOTE — Assessment & Plan Note (Signed)
Stable from last year, continue to monitor.

## 2020-08-07 NOTE — Assessment & Plan Note (Signed)
No longer on omeprazole 40 mg as she didn't notice improvement to voice hoarseness.   Discussed use of antihistamines for hoarseness and post nasal drip.

## 2020-08-07 NOTE — Assessment & Plan Note (Signed)
Chronic for 6 months, some improvement with home exercises.  Discussed importance of activity, stretching. Also discussed use of Tylenol, Ibuprofen, topical agents.  Checking plain films today of right hip, suspect arthritis. No falls.

## 2020-08-07 NOTE — Assessment & Plan Note (Signed)
Following with ophthalmology every 6 months. Continue current regimen.

## 2020-08-07 NOTE — Assessment & Plan Note (Signed)
Well controlled on atorvastatin 10 mg, continue same.

## 2020-08-07 NOTE — Assessment & Plan Note (Signed)
Doing well on Estrace cream for UTI prevention, no UTI in several years. Continue current regimen.

## 2020-08-07 NOTE — Patient Instructions (Signed)
You may try Ibuprofen 400 mg or 600 mg twice daily for 1-2 weeks for hip pain and inflammation.   You could try Ibuprofen 400 mg and Tylenol 500 mg together, twice daily for 1-2 weeks for pain and inflammation.   You can try topical treatment like diclofenac (Voltaren) gel on the hip for pain.  Complete xray(s) prior to leaving today. I will notify you of your results once received.  [facility] density scan.  Consider starting an antihistamine (Zyrtec, Allegra, Claritin) at bedtime for throat drainage and voice hoarseness.   It was a pleasure to see you today!

## 2020-08-07 NOTE — Assessment & Plan Note (Signed)
Chronic, now with 6 month history of right hip pain which sounds to be arthritic in nature.   Checking plain films in the office today to right hip. Continue home exercises, daily movement.  Discussed use of OTC treatment including topical agents.

## 2020-08-28 ENCOUNTER — Ambulatory Visit: Payer: Medicare Other

## 2020-09-01 ENCOUNTER — Telehealth: Payer: Self-pay

## 2020-09-01 NOTE — Telephone Encounter (Signed)
mychart message sent to patient re: no visitors 

## 2020-09-02 ENCOUNTER — Encounter: Payer: Self-pay | Admitting: Obstetrics and Gynecology

## 2020-09-02 ENCOUNTER — Ambulatory Visit (INDEPENDENT_AMBULATORY_CARE_PROVIDER_SITE_OTHER): Payer: Medicare Other | Admitting: Obstetrics and Gynecology

## 2020-09-02 ENCOUNTER — Other Ambulatory Visit: Payer: Self-pay

## 2020-09-02 DIAGNOSIS — N952 Postmenopausal atrophic vaginitis: Secondary | ICD-10-CM

## 2020-09-02 MED ORDER — ESTRADIOL 0.1 MG/GM VA CREA: TOPICAL_CREAM | VAGINAL | 3 refills | Status: DC

## 2020-09-02 NOTE — Progress Notes (Signed)
HPI:      Christy [name] is a 75 y.o. [phone] who LMP was No LMP recorded. Patient is postmenopausal.  Subjective:   She presents today for her GYN examination.  Patient has vaginal atrophy and uses vaginal estrogen. She continues to take Prolia for bone loss and gets regular DEXA scans.  She is up-to-date on her mammography and yearly blood work. She recently had an issue with her hip that she believes is bursitis but this has resolved with rest stretching and use of anti-inflammatory medications. Patient has completed her COVID vaccines.    Hx: The following portions of the patient's history were reviewed and updated as appropriate:             She  has a past medical history of Arthritis, Atrophy of vagina, Cystocele, GERD (gastroesophageal reflux disease), Glaucoma, Heart murmur, Rectocele, and Uveitis. She does not have any pertinent problems on file. She  has a past surgical history that includes Cataract extraction, bilateral; Tubal ligation; Eye surgery (Bilateral); Vaginal hysterectomy (Bilateral, 02/12/2018); Abdominal hysterectomy; Colonoscopy with propofol (N/A, 02/12/2019); and Esophagogastroduodenoscopy (egd) with propofol (N/A, 02/12/2019). Her family history includes Aortic stenosis in her mother; Breast cancer in her maternal aunt; Cancer in her father; Heart disease in her father and mother; Multiple sclerosis in her daughter; Osteoporosis in her mother. She  reports that she has never smoked. She has never used smokeless tobacco. She reports current alcohol use of about 7.0 standard drinks of alcohol per week. She reports that she does not use drugs. She has a current medication list which includes the following prescription(s): atorvastatin, brimonidine, calcium carbonate-vitamin d, cholecalciferol, cranberry, denosumab, dorzolamide-timolol, glucosamine-chondroitin, multivitamin, fish oil, and estradiol. She is allergic to xalatan [latanoprost].       Review of Systems:   Review of Systems  Constitutional: Denied constitutional symptoms, night sweats, recent illness, fatigue, fever, insomnia and weight loss.  Eyes: Denied eye symptoms, eye pain, photophobia, vision change and visual disturbance.  Ears/Nose/Throat/Neck: Denied ear, nose, throat or neck symptoms, hearing loss, nasal discharge, sinus congestion and sore throat.  Cardiovascular: Denied cardiovascular symptoms, arrhythmia, chest pain/pressure, edema, exercise intolerance, orthopnea and palpitations.  Respiratory: Denied pulmonary symptoms, asthma, pleuritic pain, productive sputum, cough, dyspnea and wheezing.  Gastrointestinal: Denied, gastro-esophageal reflux, melena, nausea and vomiting.  Genitourinary: Denied genitourinary symptoms including symptomatic vaginal discharge, pelvic relaxation issues, and urinary complaints.  Musculoskeletal: Denied musculoskeletal symptoms, stiffness, swelling, muscle weakness and myalgia.  Dermatologic: Denied dermatology symptoms, rash and scar.  Neurologic: Denied neurology symptoms, dizziness, headache, neck pain and syncope.  Psychiatric: Denied psychiatric symptoms, anxiety and depression.  Endocrine: Denied endocrine symptoms including hot flashes and night sweats.   Meds:   Current Outpatient Medications on File Prior to Visit  Medication Sig Dispense Refill  . atorvastatin (LIPITOR) 10 MG tablet TAKE 1 TABLET BY MOUTH IN THE EVENING FOR CHOLESTEROL 90 tablet 1  . brimonidine (ALPHAGAN) 0.2 % ophthalmic solution Place 1 drop into the left eye 2 (two) times daily.     Marland Kitchen CALCIUM CARBONATE-VITAMIN D PO Take 1 tablet by mouth 2 (two) times daily.     . cholecalciferol (VITAMIN D) 1000 UNITS tablet Take 1,000 Units by mouth daily.    . Cranberry 500 MG CAPS Take 500 mg by mouth daily.     Marland Kitchen denosumab (PROLIA) 60 MG/ML SOLN injection Inject 60 mg into the skin every 6 (six) months. Administer in upper arm, thigh, or abdomen    . dorzolamide-timolol (COSOPT)  22.3-6.8 MG/ML ophthalmic solution Place 1 drop into the left eye every 12 (twelve) hours.    Marland Kitchen GLUCOSAMINE CHONDROITIN COMPLX PO Take 1 tablet by mouth daily.     . Multiple Vitamin (MULTIVITAMIN) tablet Take 1 tablet by mouth daily.    . Omega-3 Fatty Acids (FISH OIL) 1200 MG CAPS Take 2,400 mg by mouth daily.      No current facility-administered medications on file prior to visit.          Objective:     Vitals:   09/02/20 1331  BP: (!) 149/84  Pulse: 87    Filed Weights   09/02/20 1331  Weight: 126 lb 9.6 oz (57.4 kg)              Physical examination General NAD, Conversant  HEENT Atraumatic; Op clear with mmm.  Normo-cephalic. Pupils reactive. Anicteric sclerae  Thyroid/Neck Smooth without nodularity or enlargement. Normal ROM.  Neck Supple.  Skin No rashes, lesions or ulceration. Normal palpated skin turgor. No nodularity.  Breasts: No masses or discharge.  Symmetric.  No axillary adenopathy.  Lungs: Clear to auscultation.No rales or wheezes. Normal Respiratory effort, no retractions.  Heart: NSR.  No murmurs or rubs appreciated. No periferal edema  Abdomen: Soft.  Non-tender.  No masses.  No HSM. No hernia  Extremities: Moves all appropriately.  Normal ROM for age. No lymphadenopathy.  Neuro: Oriented to PPT.  Normal mood. Normal affect.     Pelvic:   Vulva: Normal appearance.  No lesions.  Vagina: No lesions or abnormalities noted.  Moderate vaginal atrophy noted  Support: Normal pelvic support.  Urethra No masses tenderness or scarring.  Meatus Normal size without lesions or prolapse.  Cervix: Normal appearance.  No lesions.  Anus: Normal exam.  No lesions.  Perineum: Normal exam.  No lesions.        Bimanual   Uterus: Normal size.  Non-tender.  Mobile.  AV.  Adnexae: No masses.  Non-tender to palpation.  Cul-de-sac: Negative for abnormality.      Assessment:    [id number] Patient Active Problem List   Diagnosis Date Noted  . Post-nasal drip  08/07/2020  . Chronic pain of right hip 08/07/2020  . Hyperlipidemia 07/10/2019  . Prediabetes 07/10/2019  . Encounter for screening colonoscopy   . Healthcare maintenance 07/06/2018  . Atrophy of vagina 07/04/2018  . S/P TVHBSO with Anterior/Posterior Colporrhaphy 02/12/2018  . Glaucoma   . Uveitis   . GERD (gastroesophageal reflux disease) 06/09/2015  . Osteoporosis 09/02/2013  . Encounter for Medicare annual wellness exam 05/06/2013  . Arthritis      1. Atrophy of vagina     Patient would like to continue use of estrogen mainly because she believes it keeps her vagina more healthy and helps prevent urinary tract infections which have been a problem for her in the past.   Plan:            1.  Continue vaginal estrogen  2.  Other medications as directed by her nurse practitioner.  Screening mammograms and follow-up DEXA scans discussed. Orders No orders of the defined types were placed in this encounter.    Meds ordered this encounter  Medications  . estradiol (ESTRACE) 0.1 MG/GM vaginal cream    Sig: INSERT 1/2 GRAM VAGINALLY TWICE A WEEK    Dispense:  43 g    Refill:  3            F/U  Return in about 1 year (around  09/02/2021).  Finis Bud, M.D. 09/02/2020 2:11 PM

## 2020-09-10 ENCOUNTER — Other Ambulatory Visit: Payer: Self-pay

## 2020-09-10 ENCOUNTER — Ambulatory Visit
Admission: RE | Admit: 2020-09-10 | Discharge: 2020-09-10 | Disposition: A | Discharge status: Home / Self Care | Payer: Medicare Other | Source: Ambulatory Visit | Attending: Primary Care | Admitting: Primary Care

## 2020-09-10 DIAGNOSIS — E2839 Other primary ovarian failure: Secondary | ICD-10-CM | POA: Insufficient documentation

## 2020-09-10 DIAGNOSIS — M810 Age-related osteoporosis without current pathological fracture: Secondary | ICD-10-CM | POA: Insufficient documentation

## 2020-09-10 DIAGNOSIS — Z780 Asymptomatic menopausal state: Secondary | ICD-10-CM | POA: Diagnosis not present

## 2020-09-10 DIAGNOSIS — Z8262 Family history of osteoporosis: Secondary | ICD-10-CM | POA: Diagnosis not present

## 2020-09-10 DIAGNOSIS — Z8739 Personal history of other diseases of the musculoskeletal system and connective tissue: Secondary | ICD-10-CM | POA: Diagnosis not present

## 2020-12-13 ENCOUNTER — Other Ambulatory Visit: Payer: Self-pay | Admitting: Primary Care

## 2020-12-13 DIAGNOSIS — E785 Hyperlipidemia, unspecified: Secondary | ICD-10-CM

## 2020-12-29 DIAGNOSIS — H3022 Posterior cyclitis, left eye: Secondary | ICD-10-CM | POA: Diagnosis not present

## 2020-12-29 DIAGNOSIS — H401122 Primary open-angle glaucoma, left eye, moderate stage: Secondary | ICD-10-CM | POA: Diagnosis not present

## 2021-01-06 ENCOUNTER — Telehealth: Payer: Self-pay

## 2021-01-06 DIAGNOSIS — M810 Age-related osteoporosis without current pathological fracture: Secondary | ICD-10-CM

## 2021-01-06 NOTE — Telephone Encounter (Signed)
Benefit verification submitted-pending Injection due after 02/06/21

## 2021-01-20 NOTE — Telephone Encounter (Signed)
Benefits received. OOP cost is $0. Lab appointment scheduled on 02/02/21 and NV appointment on 02/09/21

## 2021-01-21 NOTE — Addendum Note (Signed)
Addended by: Kris Mouton on: 01/21/2021 09:25 AM   Modules accepted: Orders

## 2021-02-02 ENCOUNTER — Other Ambulatory Visit (INDEPENDENT_AMBULATORY_CARE_PROVIDER_SITE_OTHER): Payer: Medicare Other

## 2021-02-02 ENCOUNTER — Other Ambulatory Visit: Payer: Self-pay

## 2021-02-02 DIAGNOSIS — M810 Age-related osteoporosis without current pathological fracture: Secondary | ICD-10-CM | POA: Diagnosis not present

## 2021-02-02 LAB — BASIC METABOLIC PANEL
BUN: 13 mg/dL (ref 6–23)
CO2: 32 mEq/L (ref 19–32)
Calcium: 9.5 mg/dL (ref 8.4–10.5)
Chloride: 101 mEq/L (ref 96–112)
Creatinine, Ser: 0.74 mg/dL (ref 0.40–1.20)
GFR: 78.7 mL/min (ref >60.00)
Glucose, Bld: 78 mg/dL (ref 70–99)
Potassium: 4.4 mEq/L (ref 3.5–5.1)
Sodium: 139 mEq/L (ref 135–145)

## 2021-02-03 ENCOUNTER — Telehealth: Payer: Self-pay | Admitting: Primary Care

## 2021-02-03 NOTE — Telephone Encounter (Signed)
Per Up-To-Date guidelines for creatinine clearance >30 then no adjustment/dose reduction needed. Okay to proceed.

## 2021-02-03 NOTE — Telephone Encounter (Signed)
Creatinine Clearance is 58.61 mL/min- since it is under 60 ml/min need to verify with PCP if still ok to proceed with Prolia.  Let me know when able. Thank you.

## 2021-02-03 NOTE — Telephone Encounter (Signed)
Mrs. Mackenize called in returning [place] phone call

## 2021-02-04 NOTE — Telephone Encounter (Signed)
Patient given information see lab results for notes.

## 2021-02-04 NOTE — Telephone Encounter (Signed)
Noted! Thank you

## 2021-02-09 ENCOUNTER — Telehealth: Payer: Self-pay | Admitting: Primary Care

## 2021-02-09 ENCOUNTER — Ambulatory Visit (INDEPENDENT_AMBULATORY_CARE_PROVIDER_SITE_OTHER): Payer: Medicare Other | Admitting: *Deleted

## 2021-02-09 ENCOUNTER — Other Ambulatory Visit: Payer: Self-pay

## 2021-02-09 DIAGNOSIS — M810 Age-related osteoporosis without current pathological fracture: Secondary | ICD-10-CM

## 2021-02-09 MED ORDER — DENOSUMAB 60 MG/ML ~~LOC~~ SOSY
60.0000 mg | PREFILLED_SYRINGE | Freq: Once | SUBCUTANEOUS | Status: AC
  Administered 2021-02-09: 60 mg via SUBCUTANEOUS

## 2021-02-09 NOTE — Progress Notes (Signed)
Per orders of Silvestre Moment injection of Prolia given by Lauralyn Primes. Patient tolerated injection well.

## 2021-02-09 NOTE — Telephone Encounter (Signed)
Pt came in office to drop off vaccination card

## 2021-05-13 DIAGNOSIS — Z23 Encounter for immunization: Secondary | ICD-10-CM | POA: Diagnosis not present

## 2021-06-03 DIAGNOSIS — Z23 Encounter for immunization: Secondary | ICD-10-CM | POA: Diagnosis not present

## 2021-06-22 ENCOUNTER — Other Ambulatory Visit: Payer: Self-pay | Admitting: Primary Care

## 2021-06-22 DIAGNOSIS — Z1231 Encounter for screening mammogram for malignant neoplasm of breast: Secondary | ICD-10-CM

## 2021-07-06 ENCOUNTER — Telehealth: Payer: Self-pay

## 2021-07-06 NOTE — Telephone Encounter (Signed)
Benefits submitted, Next injection due after 08/12/21

## 2021-07-20 DIAGNOSIS — U071 COVID-19: Secondary | ICD-10-CM | POA: Diagnosis not present

## 2021-07-20 DIAGNOSIS — H401122 Primary open-angle glaucoma, left eye, moderate stage: Secondary | ICD-10-CM | POA: Diagnosis not present

## 2021-07-21 ENCOUNTER — Ambulatory Visit
Admission: RE | Admit: 2021-07-21 | Discharge: 2021-07-21 | Disposition: A | Discharge status: Home / Self Care | Payer: Medicare Other | Source: Ambulatory Visit | Attending: Primary Care | Admitting: Primary Care

## 2021-07-21 ENCOUNTER — Other Ambulatory Visit: Payer: Self-pay

## 2021-07-21 DIAGNOSIS — Z1231 Encounter for screening mammogram for malignant neoplasm of breast: Secondary | ICD-10-CM | POA: Diagnosis not present

## 2021-07-28 ENCOUNTER — Ambulatory Visit (INDEPENDENT_AMBULATORY_CARE_PROVIDER_SITE_OTHER): Payer: Medicare Other | Admitting: Dermatology

## 2021-07-28 ENCOUNTER — Other Ambulatory Visit: Payer: Self-pay

## 2021-07-28 DIAGNOSIS — D2372 Other benign neoplasm of skin of left lower limb, including hip: Secondary | ICD-10-CM | POA: Diagnosis not present

## 2021-07-28 DIAGNOSIS — Z1283 Encounter for screening for malignant neoplasm of skin: Secondary | ICD-10-CM | POA: Diagnosis not present

## 2021-07-28 DIAGNOSIS — D1800 Hemangioma unspecified site: Secondary | ICD-10-CM | POA: Diagnosis not present

## 2021-07-28 DIAGNOSIS — L820 Inflamed seborrheic keratosis: Secondary | ICD-10-CM

## 2021-07-28 DIAGNOSIS — D239 Other benign neoplasm of skin, unspecified: Secondary | ICD-10-CM

## 2021-07-28 DIAGNOSIS — D229 Melanocytic nevi, unspecified: Secondary | ICD-10-CM | POA: Diagnosis not present

## 2021-07-28 DIAGNOSIS — D692 Other nonthrombocytopenic purpura: Secondary | ICD-10-CM

## 2021-07-28 DIAGNOSIS — L578 Other skin changes due to chronic exposure to nonionizing radiation: Secondary | ICD-10-CM | POA: Diagnosis not present

## 2021-07-28 DIAGNOSIS — L821 Other seborrheic keratosis: Secondary | ICD-10-CM | POA: Diagnosis not present

## 2021-07-28 DIAGNOSIS — L219 Seborrheic dermatitis, unspecified: Secondary | ICD-10-CM

## 2021-07-28 DIAGNOSIS — L814 Other melanin hyperpigmentation: Secondary | ICD-10-CM

## 2021-07-28 MED ORDER — HYDROCORTISONE 2.5 % EX LOTN: TOPICAL_LOTION | CUTANEOUS | 6 refills | Status: AC

## 2021-07-28 NOTE — Patient Instructions (Signed)

## 2021-07-28 NOTE — Progress Notes (Signed)
New Patient Visit  Subjective  Christy [name] is a 75 y.o. female who presents for the following: ISK to treat (Back, one treated in past that didn't clear, and has another one, itchy), check spot (L leg, 13yr, no symptoms/), red scaling in ears (Bil ears, has tried otc HC cream), and Total body skin exam. The patient presents for Total-Body Skin Exam (TBSE) for skin cancer screening and mole check.  The patient has spots, moles and lesions to be evaluated, some may be new or changing and the patient has concerns that these could be cancer.  The following portions of the chart were reviewed this encounter and updated as appropriate:   Tobacco  Allergies  Meds  Problems  Med Hx  Surg Hx  Fam Hx     Review of Systems:  No other skin or systemic complaints except as noted in HPI or Assessment and Plan.  Objective  Well appearing patient in no apparent distress; mood and affect are within normal limits.  A full examination was performed including scalp, head, eyes, ears, nose, lips, neck, chest, axillae, abdomen, back, buttocks, bilateral upper extremities, bilateral lower extremities, hands, feet, fingers, toes, fingernails, and toenails. All findings within normal limits unless otherwise noted below.  L side/braline area x 1, R back x 4, Total =  5 (5) Erythematous keratotic or waxy stuck-on papule or plaque.   bil ears Mild scale  L lat calf Firm pink/brown papulenodule with dimple sign.    Assessment & Plan   Lentigines - Scattered tan macules - Due to sun exposure - Benign-appearing, observe - Recommend daily broad spectrum sunscreen SPF 30+ to sun-exposed areas, reapply every 2 hours as needed. - Call for any changes  Seborrheic Keratoses - Stuck-on, waxy, tan-brown papules and/or plaques  - Benign-appearing - Discussed benign etiology and prognosis. - Observe - Call for any changes  Melanocytic Nevi - Tan-brown and/or pink-flesh-colored symmetric macules and  papules - Benign appearing on exam today - Observation - Call clinic for new or changing moles - Recommend daily use of broad spectrum spf 30+ sunscreen to sun-exposed areas.   Hemangiomas - Red papules - Discussed benign nature - Observe - Call for any changes  Actinic Damage - Chronic condition, secondary to cumulative UV/sun exposure - diffuse scaly erythematous macules with underlying dyspigmentation - Recommend daily broad spectrum sunscreen SPF 30+ to sun-exposed areas, reapply every 2 hours as needed.  - Staying in the shade or wearing long sleeves, sun glasses (UVA+UVB protection) and wide brim hats (4-inch brim around the entire circumference of the hat) are also recommended for sun protection.  - Call for new or changing lesions.  Skin cancer screening performed today.  Inflamed seborrheic keratosis L side/braline area x 1, R back x 4, Total =  5  Destruction of lesion - L side/braline area x 1, R back x 4, Total =  5 Complexity: simple   Destruction method: cryotherapy   Informed consent: discussed and consent obtained   Timeout:  patient name, date of birth, surgical site, and procedure verified Lesion destroyed using liquid nitrogen: Yes   Region frozen until ice ball extended beyond lesion: Yes   Outcome: patient tolerated procedure well with no complications   Post-procedure details: wound care instructions given    Seborrheic dermatitis bil ears  Seborrheic Dermatitis  -  is a chronic persistent rash characterized by pinkness and scaling most commonly of the mid face but also can occur on the scalp (dandruff),  ears; mid chest, mid back and groin.  It tends to be exacerbated by stress and cooler weather.  People who have neurologic disease may experience new onset or exacerbation of existing seborrheic dermatitis.  The condition is not curable but treatable and can be controlled.  Start HC lotion 2.5% 3x/wk at bedtime to ears  hydrocortisone 2.5 % lotion - bil  ears Apply topically as directed. Apply to ears 3 nights per week Monday, Wednesday, and Fridays  Dermatofibroma L lat calf  Benign, observe.    Skin cancer screening  Purpura - Chronic; persistent and recurrent.  Treatable, but not curable. - Violaceous macules and patches - Benign - Related to trauma, age, sun damage and/or use of blood thinners, chronic use of topical and/or oral steroids - Observe - Can use OTC arnica containing moisturizer such as Dermend Bruise Formula if desired - Call for worsening or other concerns  Return in about 3 months (around 10/26/2021) for recheck ISKs, Seb Derm ears.  I, Othelia Pulling, RMA, am acting as scribe for Sarina Ser, MD . Documentation: I have reviewed the above documentation for accuracy and completeness, and I agree with the above.  Sarina Ser, MD

## 2021-07-29 DIAGNOSIS — H401122 Primary open-angle glaucoma, left eye, moderate stage: Secondary | ICD-10-CM | POA: Diagnosis not present

## 2021-07-30 NOTE — Telephone Encounter (Signed)
Left message for patient to call back  OOP cost is $0.

## 2021-07-30 NOTE — Telephone Encounter (Signed)
Pt returned your call . Would like a call back #[phone]

## 2021-08-02 ENCOUNTER — Telehealth: Payer: Self-pay | Admitting: *Deleted

## 2021-08-02 DIAGNOSIS — M810 Age-related osteoporosis without current pathological fracture: Secondary | ICD-10-CM

## 2021-08-02 NOTE — Telephone Encounter (Signed)
Please place future orders for lab appt.  

## 2021-08-02 NOTE — Telephone Encounter (Signed)
Spoke with patient. Lab appointment made 08/03/21 and Nurse visit 08/19/21 both in [place] location.

## 2021-08-02 NOTE — Telephone Encounter (Signed)
Left message to call back  

## 2021-08-03 ENCOUNTER — Other Ambulatory Visit (INDEPENDENT_AMBULATORY_CARE_PROVIDER_SITE_OTHER): Payer: Medicare Other

## 2021-08-03 ENCOUNTER — Other Ambulatory Visit: Payer: Self-pay

## 2021-08-03 DIAGNOSIS — M810 Age-related osteoporosis without current pathological fracture: Secondary | ICD-10-CM

## 2021-08-03 NOTE — Telephone Encounter (Signed)
Noted, lab orders placed. 

## 2021-08-04 LAB — BASIC METABOLIC PANEL
BUN: 11 mg/dL (ref 6–23)
CO2: 32 mEq/L (ref 19–32)
Calcium: 10 mg/dL (ref 8.4–10.5)
Chloride: 101 mEq/L (ref 96–112)
Creatinine, Ser: 0.64 mg/dL (ref 0.40–1.20)
GFR: 85.66 mL/min (ref >60.00)
Glucose, Bld: 85 mg/dL (ref 70–99)
Potassium: 4 mEq/L (ref 3.5–5.1)
Sodium: 140 mEq/L (ref 135–145)

## 2021-08-04 NOTE — Telephone Encounter (Signed)
Labs checked, 08/03/21-calcium was normal at 10 CrCl 67.76 mL/min

## 2021-08-06 ENCOUNTER — Encounter: Payer: Self-pay | Admitting: Dermatology

## 2021-08-19 ENCOUNTER — Ambulatory Visit (INDEPENDENT_AMBULATORY_CARE_PROVIDER_SITE_OTHER): Payer: Medicare Other

## 2021-08-19 ENCOUNTER — Other Ambulatory Visit: Payer: Self-pay

## 2021-08-19 DIAGNOSIS — M810 Age-related osteoporosis without current pathological fracture: Secondary | ICD-10-CM

## 2021-08-19 MED ORDER — DENOSUMAB 60 MG/ML ~~LOC~~ SOSY
60.0000 mg | PREFILLED_SYRINGE | Freq: Once | SUBCUTANEOUS | Status: AC
Start: 2021-08-19 — End: 2021-08-19
  Administered 2021-08-19: 10:00:00 60 mg via SUBCUTANEOUS

## 2021-08-19 NOTE — Progress Notes (Signed)
Per orders of [employer], In [facility] absence, injection of Prolia given by [employer]. Patient tolerated injection well.

## 2021-09-16 ENCOUNTER — Ambulatory Visit: Payer: Medicare Other

## 2021-09-23 ENCOUNTER — Encounter: Payer: Medicare Other | Admitting: Primary Care

## 2021-10-13 ENCOUNTER — Ambulatory Visit (INDEPENDENT_AMBULATORY_CARE_PROVIDER_SITE_OTHER): Payer: Medicare Other

## 2021-10-13 VITALS — Ht 60.0 in | Wt 126.0 lb

## 2021-10-13 DIAGNOSIS — Z0000 Encounter for general adult medical examination without abnormal findings: Secondary | ICD-10-CM

## 2021-10-13 NOTE — Patient Instructions (Signed)
Christy [name] , Thank you for taking time to complete your Medicare Wellness Visit. I appreciate your ongoing commitment to your health goals. Please review the following plan we discussed and let me know if I can assist you in the future.   Screening recommendations/referrals: Colonoscopy: no longer required  Mammogram: Up to date, completed 07/21/21, due 07/21/22 Bone Density: up to date, completed 09/10/20, due 09/10/21 Recommended yearly ophthalmology/optometry visit for glaucoma screening and checkup Recommended yearly dental visit for hygiene and checkup  Vaccinations: Influenza vaccine: up to date  Pneumococcal vaccine: up to date  Tdap vaccine: up to date  Shingles vaccine: up to date    Covid-19:up to date   Advanced directives: Please bring a copy of Living Will and/or [place] for your chart.   Conditions/risks identified: see problem list   Next appointment: Follow up in one year for your annual wellness visit 10/18/22 @ 9:00am, this will be a telephone visit    Preventive Care 65 Years and Older, Female Preventive care refers to lifestyle choices and visits with your health care provider that can promote health and wellness. What does preventive care include? A yearly physical exam. This is also called an annual well check. Dental exams once or twice a year. Routine eye exams. Ask your health care provider how often you should have your eyes checked. Personal lifestyle choices, including: Daily care of your teeth and gums. Regular physical activity. Eating a healthy diet. Avoiding tobacco and drug use. Limiting alcohol use. Practicing safe sex. Taking low-dose aspirin every day. Taking vitamin and mineral supplements as recommended by your health care provider. What happens during an annual well check? The services and screenings done by your health care provider during your annual well check will depend on your age, overall health, lifestyle  risk factors, and family history of disease. Counseling  Your health care provider may ask you questions about your: Alcohol use. Tobacco use. Drug use. Emotional well-being. Home and relationship well-being. Sexual activity. Eating habits. History of falls. Memory and ability to understand (cognition). Work and work Statistician. Reproductive health. Screening  You may have the following tests or measurements: Height, weight, and BMI. Blood pressure. Lipid and cholesterol levels. These may be checked every 5 years, or more frequently if you are over 67 years old. Skin check. Lung cancer screening. You may have this screening every year starting at age 51 if you have a 30-pack-year history of smoking and currently smoke or have quit within the past 15 years. Fecal occult blood test (FOBT) of the stool. You may have this test every year starting at age 70. Flexible sigmoidoscopy or colonoscopy. You may have a sigmoidoscopy every 5 years or a colonoscopy every 10 years starting at age 50. Hepatitis C blood test. Hepatitis B blood test. Sexually transmitted disease (STD) testing. Diabetes screening. This is done by checking your blood sugar (glucose) after you have not eaten for a while (fasting). You may have this done every 1-3 years. Bone density scan. This is done to screen for osteoporosis. You may have this done starting at age 69. Mammogram. This may be done every 1-2 years. Talk to your health care provider about how often you should have regular mammograms. Talk with your health care provider about your test results, treatment options, and if necessary, the need for more tests. Vaccines  Your health care provider may recommend certain vaccines, such as: Influenza vaccine. This is recommended every year. Tetanus, diphtheria, and acellular pertussis (  Tdap, Td) vaccine. You may need a Td booster every 10 years. Zoster vaccine. You may need this after age 71. Pneumococcal  13-valent conjugate (PCV13) vaccine. One dose is recommended after age 55. Pneumococcal polysaccharide (PPSV23) vaccine. One dose is recommended after age 46. Talk to your health care provider about which screenings and vaccines you need and how often you need them. This information is not intended to replace advice given to you by your health care provider. Make sure you discuss any questions you have with your health care provider. Document Released: 09/04/2015 Document Revised: 04/27/2016 Document Reviewed: 06/09/2015 Elsevier Interactive Patient Education  2017 [place] Prevention in the Home Falls can cause injuries. They can happen to people of all ages. There are many things you can do to make your home safe and to help prevent falls. What can I do on the outside of my home? Regularly fix the edges of walkways and driveways and fix any cracks. Remove anything that might make you trip as you walk through a door, such as a raised step or threshold. Trim any bushes or trees on the path to your home. Use bright outdoor lighting. Clear any walking paths of anything that might make someone trip, such as rocks or tools. Regularly check to see if handrails are loose or broken. Make sure that both sides of any steps have handrails. Any raised decks and porches should have guardrails on the edges. Have any leaves, snow, or ice cleared regularly. Use sand or salt on walking paths during winter. Clean up any spills in your garage right away. This includes oil or grease spills. What can I do in the bathroom? Use night lights. Install grab bars by the toilet and in the tub and shower. Do not use towel bars as grab bars. Use non-skid mats or decals in the tub or shower. If you need to sit down in the shower, use a plastic, non-slip stool. Keep the floor dry. Clean up any water that spills on the floor as soon as it happens. Remove soap buildup in the tub or shower regularly. Attach  bath mats securely with double-sided non-slip rug tape. Do not have throw rugs and other things on the floor that can make you trip. What can I do in the bedroom? Use night lights. Make sure that you have a light by your bed that is easy to reach. Do not use any sheets or blankets that are too big for your bed. They should not hang down onto the floor. Have a firm chair that has side arms. You can use this for support while you get dressed. Do not have throw rugs and other things on the floor that can make you trip. What can I do in the kitchen? Clean up any spills right away. Avoid walking on wet floors. Keep items that you use a lot in easy-to-reach places. If you need to reach something above you, use a strong step stool that has a grab bar. Keep electrical cords out of the way. Do not use floor polish or wax that makes floors slippery. If you must use wax, use non-skid floor wax. Do not have throw rugs and other things on the floor that can make you trip. What can I do with my stairs? Do not leave any items on the stairs. Make sure that there are handrails on both sides of the stairs and use them. Fix handrails that are broken or loose. Make sure that handrails are  as long as the stairways. Check any carpeting to make sure that it is firmly attached to the stairs. Fix any carpet that is loose or worn. Avoid having throw rugs at the top or bottom of the stairs. If you do have throw rugs, attach them to the floor with carpet tape. Make sure that you have a light switch at the top of the stairs and the bottom of the stairs. If you do not have them, ask someone to add them for you. What else can I do to help prevent falls? Wear shoes that: Do not have high heels. Have rubber bottoms. Are comfortable and fit you well. Are closed at the toe. Do not wear sandals. If you use a stepladder: Make sure that it is fully opened. Do not climb a closed stepladder. Make sure that both sides of the  stepladder are locked into place. Ask someone to hold it for you, if possible. Clearly mark and make sure that you can see: Any grab bars or handrails. First and last steps. Where the edge of each step is. Use tools that help you move around (mobility aids) if they are needed. These include: Canes. Walkers. Scooters. Crutches. Turn on the lights when you go into a dark area. Replace any light bulbs as soon as they burn out. Set up your furniture so you have a clear path. Avoid moving your furniture around. If any of your floors are uneven, fix them. If there are any pets around you, be aware of where they are. Review your medicines with your doctor. Some medicines can make you feel dizzy. This can increase your chance of falling. Ask your doctor what other things that you can do to help prevent falls. This information is not intended to replace advice given to you by your health care provider. Make sure you discuss any questions you have with your health care provider. Document Released: 06/04/2009 Document Revised: 01/14/2016 Document Reviewed: 09/12/2014 Elsevier Interactive Patient Education  2017 [employer].

## 2021-10-13 NOTE — Progress Notes (Signed)
Subjective:   Christy [name] is a 76 y.o. female who presents for Medicare Annual (Subsequent) preventive examination.  I connected with Jenipher Havel today by telephone and verified that I am speaking with the correct person using two identifiers. Location patient: home Location provider: work Persons participating in the virtual visit: patient, Marine scientist.    I discussed the limitations, risks, security and privacy concerns of performing an evaluation and management service by telephone and the availability of in person appointments. I also discussed with the patient that there may be a patient responsible charge related to this service. The patient expressed understanding and verbally consented to this telephonic visit.    Interactive audio and video telecommunications were attempted between this provider and patient, however failed, due to patient having technical difficulties OR patient did not have access to video capability.  We continued and completed visit with audio only.  Some vital signs may be absent or patient reported.   Time Spent with patient on telephone encounter: 20 minutes  Review of Systems     Cardiac Risk Factors include: advanced age (>37men, >22 women);dyslipidemia     Objective:    Today's Vitals   10/13/21 0858  Weight: 126 lb (57.2 kg)  Height: 5' (1.524 m)   Body mass index is 24.61 kg/m.  Advanced Directives 10/13/2021 07/24/2020 07/03/2019 02/12/2019 06/22/2018 02/12/2018 02/05/2018  Does Patient Have a Medical Advance Directive? Yes Yes Yes Yes Yes Yes Yes  Type of Paramedic of [place];Living will [place];Living will [place];Living will [place];Living will;Out of facility DNR (pink MOST or yellow form) [place];Living will [place];Living will -  Does patient want to make changes to medical advance directive? Yes  (MAU/Ambulatory/Procedural Areas - Information given) - - - - No - Patient declined -  Copy of [place] in Chart? - Yes - validated most recent copy scanned in chart (See row information) Yes - validated most recent copy scanned in chart (See row information) Yes - validated most recent copy scanned in chart (See row information) Yes Yes -    Current Medications (verified) Outpatient Encounter Medications as of 10/13/2021  Medication Sig   atorvastatin (LIPITOR) 10 MG tablet TAKE 1 TABLET BY MOUTH IN THE EVENING FOR CHOLESTEROL   brimonidine (ALPHAGAN) 0.2 % ophthalmic solution Place 1 drop into the left eye 2 (two) times daily.    CALCIUM CARBONATE-VITAMIN D PO Take 1 tablet by mouth 2 (two) times daily.    cholecalciferol (VITAMIN D) 1000 UNITS tablet Take 1,000 Units by mouth daily.   Cranberry 500 MG CAPS Take 500 mg by mouth daily.    denosumab (PROLIA) 60 MG/ML SOLN injection Inject 60 mg into the skin every 6 (six) months. Administer in upper arm, thigh, or abdomen   dorzolamide-timolol (COSOPT) 22.3-6.8 MG/ML ophthalmic solution Place 1 drop into the left eye every 12 (twelve) hours.   estradiol (ESTRACE) 0.1 MG/GM vaginal cream INSERT 1/2 GRAM VAGINALLY TWICE A WEEK   GLUCOSAMINE CHONDROITIN COMPLX PO Take 1 tablet by mouth daily.    hydrocortisone 2.5 % lotion Apply topically as directed. Apply to ears 3 nights per week Monday, Wednesday, and Fridays   Multiple Vitamin (MULTIVITAMIN) tablet Take 1 tablet by mouth daily.   Omega-3 Fatty Acids (FISH OIL) 1200 MG CAPS Take 2,400 mg by mouth daily.    No facility-administered encounter medications on file as of 10/13/2021.  Allergies (verified) Xalatan [latanoprost]   History: Past Medical History:  Diagnosis Date   Arthritis    Atrophy of vagina    Cystocele    GERD (gastroesophageal reflux disease)    Glaucoma    left eye   Heart murmur    Rectocele    Uveitis    left   Past Surgical History:   Procedure Laterality Date   ABDOMINAL HYSTERECTOMY     CATARACT EXTRACTION, BILATERAL     COLONOSCOPY WITH PROPOFOL N/A 02/12/2019   Procedure: COLONOSCOPY WITH PROPOFOL;  Surgeon: Lucilla Lame, MD;  Location: [facility] ENDOSCOPY;  Service: Endoscopy;  Laterality: N/A;   ESOPHAGOGASTRODUODENOSCOPY (EGD) WITH PROPOFOL N/A 02/12/2019   Procedure: ESOPHAGOGASTRODUODENOSCOPY (EGD) WITH PROPOFOL;  Surgeon: Lucilla Lame, MD;  Location: [facility] ENDOSCOPY;  Service: Endoscopy;  Laterality: N/A;   EYE SURGERY Bilateral    lens implant   TUBAL LIGATION     VAGINAL HYSTERECTOMY Bilateral 02/12/2018   Procedure: HYSTERECTOMY VAGINAL WITH BILATERAL SALPINGO OOPHERECTOMY;  Surgeon: Brayton Mars, MD;  Location: [facility] ORS;  Service: Gynecology;  Laterality: Bilateral;   Family History  Problem Relation Age of Onset   Aortic stenosis Mother    Heart disease Mother    Osteoporosis Mother    Cancer Father        prostate   Heart disease Father    Multiple sclerosis Daughter    Breast cancer Other    Social History   Socioeconomic History   Marital status: Widowed    Spouse name: Not on file   Number of children: Not on file   Years of education: Not on file   Highest education level: Not on file  Occupational History   Not on file  Tobacco Use   Smoking status: Never   Smokeless tobacco: Never  Vaping Use   Vaping Use: Never used  Substance and Sexual Activity   Alcohol use: Yes    Alcohol/week: 1.0 standard drink    Types: 1 Glasses of wine per week    Comment: wine weekly   Drug use: No   Sexual activity: Not Currently    Birth control/protection: Surgical  Other Topics Concern   Not on file  Social History Narrative   [place] [facility] [place]   She is primary caregiver for her husband who has Alzheimers.   Two children.      Retired Engineer, production.      Desires CPR.   Has living will- SCANNED INTO CHART   Would not want prolonged life support if  futile.                  Social Determinants of Health   Financial Resource Strain: Low Risk    Difficulty of Paying Living Expenses: Not hard at all  Food Insecurity: No Food Insecurity   Worried About Charity fundraiser in the Last Year: Never true   [place] in the Last Year: Never true  Transportation Needs: No Transportation Needs   Lack of Transportation (Medical): No   Lack of Transportation (Non-Medical): No  Physical Activity: Sufficiently Active   Days of Exercise per Week: 7 days   Minutes of Exercise per Session: 30 min  Stress: No Stress Concern Present   Feeling of Stress : Not at all  Social Connections: Moderately Integrated   Frequency of Communication with Friends and Family: More than three times a week   Frequency of Social Gatherings with Friends  and Family: More than three times a week   Attends Religious Services: More than 4 times per year   Active Member of Clubs or Organizations: Yes   Attends Archivist Meetings: More than 4 times per year   Marital Status: Widowed    Tobacco Counseling Counseling given: Not Answered   Clinical Intake:  Pre-visit preparation completed: Yes  Pain : No/denies pain     BMI - recorded: 24.61 Nutritional Status: BMI of 19-24  Normal Nutritional Risks: None Diabetes: No  How often do you need to have someone help you when you read instructions, pamphlets, or other written materials from your doctor or pharmacy?: 1 - Never  Diabetic? No  Interpreter Needed?: No  Information entered by :: Orrin Brigham LPN   Activities of Daily Living In your present state of health, do you have any difficulty performing the following activities: 10/13/2021  Hearing? Y  Vision? N  Difficulty concentrating or making decisions? N  Walking or climbing stairs? N  Dressing or bathing? N  Doing errands, shopping? N  Preparing Food and eating ? N  Using the Toilet? N  In the past six months, have you  accidently leaked urine? Y  Do you have problems with loss of bowel control? N  Managing your Medications? N  Managing your Finances? N  Housekeeping or managing your Housekeeping? N  Some recent data might be hidden    Patient Care Team: Pleas Koch, NP as PCP - General (Internal Medicine) Leandrew Koyanagi, MD as Referring Physician (Ophthalmology) Defrancesco, Alanda Slim, MD as Consulting Physician (Obstetrics and Gynecology)  Indicate any recent Medical Services you may have received from other than [facility] providers in the past year (date may be approximate).     Assessment:   This is a routine wellness examination for Annely.  Hearing/Vision screen Hearing Screening - Comments:: Decrease in hearing with back ground noise Vision Screening - Comments:: Last exam 07/2021, Dr. Wallace Going, wears glasses   Dietary issues and exercise activities discussed: Current Exercise Habits: Home exercise routine, Type of exercise: walking;stretching, Time (Minutes): 30, Frequency (Times/Week): 7, Weekly Exercise (Minutes/Week): 210, Intensity: Moderate   Goals Addressed             This Visit's Progress    Patient Stated       Would like to maintain current routine        Depression Screen PHQ 2/9 Scores 10/13/2021 08/07/2020 07/24/2020 07/03/2019 06/22/2018 06/09/2016 06/09/2015  PHQ - 2 Score 0 0 0 0 0 0 0  PHQ- 9 Score - - 0 0 0 - -    Fall Risk Fall Risk  10/13/2021 08/07/2020 07/24/2020 07/03/2019 06/22/2018  Falls in the past year? 0 0 0 0 0  Number falls in past yr: 0 - 0 0 -  Injury with Fall? 0 - 0 0 -  Risk for fall due to : No Fall Risks - No Fall Risks - -  Follow up Falls prevention discussed - Falls evaluation completed;Falls prevention discussed Falls evaluation completed;Falls prevention discussed -    FALL RISK PREVENTION PERTAINING TO THE HOME:  Any stairs in or around the home? Yes  If so, are there any without handrails? No  Home free of loose throw rugs  in walkways, pet beds, electrical cords, etc? Yes  Adequate lighting in your home to reduce risk of falls? Yes   ASSISTIVE DEVICES UTILIZED TO PREVENT FALLS:  Life alert? No  Use of a cane, walker or  w/c? No  Grab bars in the bathroom? Yes  Shower chair or bench in shower? Yes  Elevated toilet seat or a handicapped toilet? Yes   TIMED UP AND GO:  Was the test performed? No .    Cognitive Function: Normal cognitive status assessed by this Nurse Health Advisor. No abnormalities found.   MMSE - Mini Mental State Exam 07/24/2020 07/03/2019 06/22/2018  Orientation to time 5 5 5   Orientation to Place 5 5 5   Registration 3 3 3   Attention/ Calculation 5 5 0  Recall 3 3 3   Language- name 2 objects - - 0  Language- repeat 1 1 1   Language- follow 3 step command - - 3  Language- read & follow direction - - 0  Write a sentence - - 0  Copy design - - 0  Total score - - 20        Immunizations Immunization History  Administered Date(s) Administered   Fluad Quad(high Dose 65+) 05/21/2019, 05/02/2020   Influenza, Seasonal, Injecte, Preservative Fre 06/29/2006, 07/04/2007, 06/24/2008, 05/28/2009, 06/14/2010, 06/03/2011, 05/28/2012   Influenza,inj,Quad PF,6+ Mos 05/06/2013, 05/20/2014, 06/09/2015, 06/15/2017, 06/22/2018   Influenza-Unspecified 06/03/2021   Moderna Sars-Covid-2 Vaccination 09/05/2019, 10/03/2019, 07/07/2020, 12/30/2020   Pfizer Covid-19 Vaccine Bivalent Booster 2yrs & up 05/13/2021   Pneumococcal Conjugate-13 07/10/2014   Pneumococcal Polysaccharide-23 01/05/2010   Pneumococcal-Unspecified 01/05/2010   Td 08/22/1996, 12/21/2006, 07/26/2017   Tdap 12/21/2006   Zoster Recombinat (Shingrix) 03/29/2018, 07/04/2018   Zoster, Live 02/24/2006    TDAP status: Up to date  Flu Vaccine status: Up to date  Pneumococcal vaccine status: Up to date  Covid-19 vaccine status: Completed vaccines  Qualifies for Shingles Vaccine? Yes   Zostavax completed Yes   Shingrix  Completed?: Yes  Screening Tests Health Maintenance  Topic Date Due   TETANUS/TDAP  07/27/2027   Pneumonia Vaccine 36+ Years old  Completed   INFLUENZA VACCINE  Completed   DEXA SCAN  Completed   COVID-19 Vaccine  Completed   Hepatitis C Screening  Completed   Zoster Vaccines- Shingrix  Completed   HPV VACCINES  Aged Out   COLONOSCOPY (Pts 45-24yrs Insurance coverage will need to be confirmed)  Discontinued    Health Maintenance  There are no preventive care reminders to display for this patient.   Colorectal cancer screening: No longer required.   Mammogram status: Completed 07/21/21. Repeat every year  Bone Density status: Completed 09/10/20. Results reflect: Bone density results: OSTEOPOROSIS. Repeat every 2 years.  Lung Cancer Screening: (Low Dose CT Chest recommended if Age 83-80 years, 30 pack-year currently smoking OR have quit w/in 15years.) does not qualify.    Additional Screening:  Hepatitis C Screening: does not qualify  Vision Screening: Recommended annual ophthalmology exams for early detection of glaucoma and other disorders of the eye. Is the patient up to date with their annual eye exam?  Yes  Who is the provider or what is the name of the office in which the patient attends annual eye exams? Dr. Wallace Going     Dental Screening: Recommended annual dental exams for proper oral hygiene  Community Resource Referral / Chronic Care Management: CRR required this visit?  No   CCM required this visit?  No      Plan:     I have personally reviewed and noted the following in the patients chart:   Medical and social history Use of alcohol, tobacco or illicit drugs  Current medications and supplements including opioid prescriptions.  Functional ability and status  Nutritional status Physical activity Advanced directives List of other physicians Hospitalizations, surgeries, and ER visits in previous 12 months Vitals Screenings to include cognitive,  depression, and falls Referrals and appointments  In addition, I have reviewed and discussed with patient certain preventive protocols, quality metrics, and best practice recommendations. A written personalized care plan for preventive services as well as general preventive health recommendations were provided to patient.   Due to this being a telephonic visit, the after visit summary with patients personalized plan was offered to patient via mail or my-chart. Patient would like to access on my-chart.     Loma Messing, LPN   [id number]   Nurse Health Advisor  Nurse Notes: none

## 2021-10-15 ENCOUNTER — Other Ambulatory Visit: Payer: Self-pay | Admitting: Primary Care

## 2021-10-15 DIAGNOSIS — E785 Hyperlipidemia, unspecified: Secondary | ICD-10-CM

## 2021-10-19 DIAGNOSIS — U071 COVID-19: Secondary | ICD-10-CM | POA: Diagnosis not present

## 2021-10-19 NOTE — Progress Notes (Signed)
I reviewed health advisor's note, was available for consultation, and agree with documentation and plan.  

## 2021-10-20 ENCOUNTER — Encounter: Payer: Medicare Other | Admitting: Primary Care

## 2021-10-27 ENCOUNTER — Other Ambulatory Visit: Payer: Self-pay

## 2021-10-27 ENCOUNTER — Ambulatory Visit (INDEPENDENT_AMBULATORY_CARE_PROVIDER_SITE_OTHER): Payer: Medicare Other | Admitting: Primary Care

## 2021-10-27 ENCOUNTER — Encounter: Payer: Self-pay | Admitting: Primary Care

## 2021-10-27 VITALS — BP 136/72 | HR 67 | Temp 97.8°F | Ht 64.0 in | Wt 125.0 lb

## 2021-10-27 DIAGNOSIS — M810 Age-related osteoporosis without current pathological fracture: Secondary | ICD-10-CM | POA: Diagnosis not present

## 2021-10-27 DIAGNOSIS — E785 Hyperlipidemia, unspecified: Secondary | ICD-10-CM

## 2021-10-27 DIAGNOSIS — R7303 Prediabetes: Secondary | ICD-10-CM

## 2021-10-27 DIAGNOSIS — L821 Other seborrheic keratosis: Secondary | ICD-10-CM | POA: Diagnosis not present

## 2021-10-27 DIAGNOSIS — H4089 Other specified glaucoma: Secondary | ICD-10-CM | POA: Diagnosis not present

## 2021-10-27 DIAGNOSIS — H209 Unspecified iridocyclitis: Secondary | ICD-10-CM

## 2021-10-27 DIAGNOSIS — K219 Gastro-esophageal reflux disease without esophagitis: Secondary | ICD-10-CM | POA: Diagnosis not present

## 2021-10-27 DIAGNOSIS — N952 Postmenopausal atrophic vaginitis: Secondary | ICD-10-CM | POA: Diagnosis not present

## 2021-10-27 NOTE — Assessment & Plan Note (Signed)
Following with ophthalmology. 

## 2021-10-27 NOTE — Patient Instructions (Addendum)
Stop by the lab prior to leaving today. I will notify you of your results once received.   It was a pleasure to see you today!  

## 2021-10-27 NOTE — Assessment & Plan Note (Signed)
No concerns today. ?Not currently on treatment.  ?

## 2021-10-27 NOTE — Assessment & Plan Note (Signed)
Improved.  ? ?Bone density scan reviewed from 2022. ? ?Continue Prolia 60 mg semi-annually. ?Continue calcium and vitamin D daily. ?Continue weight bearing exercise.  ?

## 2021-10-27 NOTE — Addendum Note (Signed)
Addended by: Ellamae Sia on: 10/27/2021 02:23 PM ? ? Modules accepted: Orders ? ?

## 2021-10-27 NOTE — Progress Notes (Signed)
? ?Subjective:  ? ? Patient ID: Christy [name], female    DOB: [birth year], 76 y.o.   MRN: [id number] ? ?HPI ? ?Christy [name] is a very pleasant 76 y.o. female with a history of GERD, osteoporosis, glaucoma, hyperlipidemia, prediabetes, chronic hip pain who presents today for follow-up of chronic conditions ? ?1) Osteoporosis: Currently managed on calcium and vitamin D, Prolia injection semiannually.  She has been consistently on Prolia since 2018.  Last bone density scan was in 2022, osteopenia with improvement compared to 2019.  ? ?2) Hyperlipidemia: Currently managed on atorvastatin 10 mg daily. Due for repeat lipid panel today.  ? ?3) Glaucoma/Uveitis: Currently following with ophthalmology, managed on Cosopt drops, Alphagan drops.  Overall stable, slight change to left eye. ? ? ?Immunizations: ?-Tetanus: 2018 ?-Influenza: Completed this season  ?-Covid-19: Completed 5 vaccines ?-Shingles: Completed Zostavax and Shingrix ?-Pneumonia: Prevnar 13 in 2015, Pneumovax in 2011 ?Mammogram: Completed in November 2022. ?Dexa: Completed in 2022 ?Colonoscopy: Completed in 2020, no further imaging needed given age. ? ? ? ? ? ?Review of Systems  ?Respiratory:  Negative for shortness of breath.   ?Cardiovascular:  Negative for chest pain.  ?Neurological:  Negative for dizziness and headaches.  ?Psychiatric/Behavioral:  The patient is not nervous/anxious.   ? ?   ? ? ?Past Medical History:  ?Diagnosis Date  ? Arthritis   ? Atrophy of vagina   ? Cystocele   ? GERD (gastroesophageal reflux disease)   ? Glaucoma   ? left eye  ? Heart murmur   ? Rectocele   ? Uveitis   ? left  ? ? ?Social History  ? ?Socioeconomic History  ? Marital status: Widowed  ?  Spouse name: Not on file  ? Number of children: Not on file  ? Years of education: Not on file  ? Highest education level: Not on file  ?Occupational History  ? Not on file  ?Tobacco Use  ? Smoking status: Never  ? Smokeless tobacco: Never  ?Vaping Use  ? Vaping Use: Never used   ?Substance and Sexual Activity  ? Alcohol use: Yes  ?  Alcohol/week: 1.0 standard drink  ?  Types: 1 Glasses of wine per week  ?  Comment: wine weekly  ? Drug use: No  ? Sexual activity: Not Currently  ?  Birth control/protection: Surgical  ?Other Topics Concern  ? Not on file  ?Social History Narrative  ? Recently moved to [facility] from [place].  ? She is primary caregiver for her husband who has Alzheimers.  ? Two children.  ?   ? Retired Engineer, production.  ?   ? Desires CPR.  ? Has living will- SCANNED INTO CHART  ? Would not want prolonged life support if futile.  ?   ?   ?   ?   ?   ? ?Social Determinants of Health  ? ?Financial Resource Strain: Low Risk   ? Difficulty of Paying Living Expenses: Not hard at all  ?Food Insecurity: No Food Insecurity  ? Worried About Charity fundraiser in the Last Year: Never true  ? Ran Out of Food in the Last Year: Never true  ?Transportation Needs: No Transportation Needs  ? Lack of Transportation (Medical): No  ? Lack of Transportation (Non-Medical): No  ?Physical Activity: Sufficiently Active  ? Days of Exercise per Week: 7 days  ? Minutes of Exercise per Session: 30 min  ?Stress: No Stress Concern Present  ? Feeling of  Stress : Not at all  ?Social Connections: Moderately Integrated  ? Frequency of Communication with Friends and Family: More than three times a week  ? Frequency of Social Gatherings with Friends and Family: More than three times a week  ? Attends Religious Services: More than 4 times per year  ? Active Member of Clubs or Organizations: Yes  ? Attends Archivist Meetings: More than 4 times per year  ? Marital Status: Widowed  ?Intimate Partner Violence: Not At Risk  ? Fear of Current or Ex-Partner: No  ? Emotionally Abused: No  ? Physically Abused: No  ? Sexually Abused: No  ? ? ?Past Surgical History:  ?Procedure Laterality Date  ? ABDOMINAL HYSTERECTOMY    ? CATARACT EXTRACTION, BILATERAL    ? COLONOSCOPY WITH PROPOFOL N/A 02/12/2019  ?  Procedure: COLONOSCOPY WITH PROPOFOL;  Surgeon: Lucilla Lame, MD;  Location: [facility] ENDOSCOPY;  Service: Endoscopy;  Laterality: N/A;  ? ESOPHAGOGASTRODUODENOSCOPY (EGD) WITH PROPOFOL N/A 02/12/2019  ? Procedure: ESOPHAGOGASTRODUODENOSCOPY (EGD) WITH PROPOFOL;  Surgeon: Lucilla Lame, MD;  Location: [facility] ENDOSCOPY;  Service: Endoscopy;  Laterality: N/A;  ? EYE SURGERY Bilateral   ? lens implant  ? TUBAL LIGATION    ? VAGINAL HYSTERECTOMY Bilateral 02/12/2018  ? Procedure: HYSTERECTOMY VAGINAL WITH BILATERAL SALPINGO OOPHERECTOMY;  Surgeon: Brayton Mars, MD;  Location: [facility] ORS;  Service: Gynecology;  Laterality: Bilateral;  ? ? ?Family History  ?Problem Relation Age of Onset  ? Aortic stenosis Mother   ? Heart disease Mother   ? Osteoporosis Mother   ? Cancer Father   ?     prostate  ? Heart disease Father   ? Multiple sclerosis Daughter   ? Breast cancer Other   ? ? ?Allergies  ?Allergen Reactions  ? Xalatan [Latanoprost] Itching, Swelling and Other (See Comments)  ?  Caused eye to be red, swollen,itchy  ? ? ?Current Outpatient Medications on File Prior to Visit  ?Medication Sig Dispense Refill  ? atorvastatin (LIPITOR) 10 MG tablet Take 1 tablet (10 mg total) by mouth daily. For cholesterol. Office visit required for further refills. 30 tablet 0  ? brimonidine (ALPHAGAN) 0.2 % ophthalmic solution Place 1 drop into the left eye 2 (two) times daily.     ? CALCIUM CARBONATE-VITAMIN D PO Take 1 tablet by mouth 2 (two) times daily.     ? cholecalciferol (VITAMIN D) 1000 UNITS tablet Take 1,000 Units by mouth daily.    ? Cranberry 500 MG CAPS Take 500 mg by mouth daily.     ? denosumab (PROLIA) 60 MG/ML SOLN injection Inject 60 mg into the skin every 6 (six) months. Administer in upper arm, thigh, or abdomen    ? dorzolamide-timolol (COSOPT) 22.3-6.8 MG/ML ophthalmic solution Place 1 drop into the left eye every 12 (twelve) hours.    ? estradiol (ESTRACE) 0.1 MG/GM vaginal cream INSERT 1/2 GRAM VAGINALLY TWICE A  WEEK 43 g 3  ? GLUCOSAMINE CHONDROITIN COMPLX PO Take 1 tablet by mouth daily.     ? hydrocortisone 2.5 % lotion Apply topically as directed. Apply to ears 3 nights per week Monday, Wednesday, and Fridays 59 mL 6  ? Multiple Vitamin (MULTIVITAMIN) tablet Take 1 tablet by mouth daily.    ? Omega-3 Fatty Acids (FISH OIL) 1200 MG CAPS Take 2,400 mg by mouth daily.     ? ?No current facility-administered medications on file prior to visit.  ? ? ?BP 136/72   Pulse 67   Temp 97.8 ?F (  36.6 ?C) (Oral)   Ht '5\' 4"'$  (1.626 m)   Wt 125 lb (56.7 kg)   SpO2 97%   BMI 21.46 kg/m?  ?Objective:  ? Physical Exam ?Cardiovascular:  ?   Rate and Rhythm: Normal rate and regular rhythm.  ?Pulmonary:  ?   Effort: Pulmonary effort is normal.  ?   Breath sounds: Normal breath sounds.  ?Musculoskeletal:  ?   Cervical back: Neck supple.  ?Skin: ?   General: Skin is warm and dry.  ?Psychiatric:     ?   Mood and Affect: Mood normal.  ? ? ? ? ? ?   ?Assessment & Plan:  ? ? ? ? ?This visit occurred during the SARS-CoV-2 public health emergency.  Safety protocols were in place, including screening questions prior to the visit, additional usage of staff PPE, and extensive cleaning of exam room while observing appropriate contact time as indicated for disinfecting solutions.  ?

## 2021-10-27 NOTE — Assessment & Plan Note (Signed)
Following ophthalmology. ?Stable. ? ?Continue Cosopt drops, Alphagan drops. ?

## 2021-10-27 NOTE — Assessment & Plan Note (Signed)
Following with dermatology 

## 2021-10-27 NOTE — Assessment & Plan Note (Signed)
Repeat A1C pending. 

## 2021-10-27 NOTE — Assessment & Plan Note (Signed)
Continue atorvastatin 10 mg daily. Repeat lipid panel pending. 

## 2021-10-27 NOTE — Assessment & Plan Note (Signed)
Stable. ? ?Continue Estrace vaginal cream twice weekly for UTI prevention.  ?

## 2021-10-28 ENCOUNTER — Other Ambulatory Visit (INDEPENDENT_AMBULATORY_CARE_PROVIDER_SITE_OTHER): Payer: Medicare Other

## 2021-10-28 DIAGNOSIS — E785 Hyperlipidemia, unspecified: Secondary | ICD-10-CM | POA: Diagnosis not present

## 2021-10-28 DIAGNOSIS — R7303 Prediabetes: Secondary | ICD-10-CM | POA: Diagnosis not present

## 2021-10-28 LAB — CBC
HCT: 42.8 % (ref 36.0–46.0)
Hemoglobin: 14.1 g/dL (ref 12.0–15.0)
MCHC: 33 g/dL (ref 30.0–36.0)
MCV: 97.4 fl (ref 78.0–100.0)
Platelets: 189 10*3/uL (ref 150.0–400.0)
RBC: 4.39 Mil/uL (ref 3.87–5.11)
RDW: 13.7 % (ref 11.5–15.5)
WBC: 4.5 10*3/uL (ref 4.0–10.5)

## 2021-10-28 LAB — LIPID PANEL
Cholesterol: 172 mg/dL (ref 0–200)
HDL: 81.2 mg/dL (ref >39.00)
LDL Cholesterol: 79 mg/dL (ref 0–99)
NonHDL: 90.73
Total CHOL/HDL Ratio: 2
Triglycerides: 58 mg/dL (ref 0.0–149.0)
VLDL: 11.6 mg/dL (ref 0.0–40.0)

## 2021-10-28 LAB — COMPREHENSIVE METABOLIC PANEL WITH GFR
ALT: 27 U/L (ref 0–35)
AST: 31 U/L (ref 0–37)
Albumin: 4.3 g/dL (ref 3.5–5.2)
Alkaline Phosphatase: 30 U/L — ABNORMAL LOW (ref 39–117)
BUN: 16 mg/dL (ref 6–23)
CO2: 30 meq/L (ref 19–32)
Calcium: 9.3 mg/dL (ref 8.4–10.5)
Chloride: 102 meq/L (ref 96–112)
Creatinine, Ser: 0.72 mg/dL (ref 0.40–1.20)
GFR: 80.92 mL/min
Glucose, Bld: 99 mg/dL (ref 70–99)
Potassium: 4.3 meq/L (ref 3.5–5.1)
Sodium: 139 meq/L (ref 135–145)
Total Bilirubin: 0.7 mg/dL (ref 0.2–1.2)
Total Protein: 6.3 g/dL (ref 6.0–8.3)

## 2021-10-28 LAB — HEMOGLOBIN A1C: Hgb A1c MFr Bld: 5.9 % (ref 4.6–6.5)

## 2021-11-01 ENCOUNTER — Other Ambulatory Visit: Payer: Self-pay | Admitting: Primary Care

## 2021-11-01 DIAGNOSIS — E785 Hyperlipidemia, unspecified: Secondary | ICD-10-CM

## 2021-11-09 ENCOUNTER — Ambulatory Visit (INDEPENDENT_AMBULATORY_CARE_PROVIDER_SITE_OTHER): Payer: Medicare Other | Admitting: Dermatology

## 2021-11-09 ENCOUNTER — Encounter: Payer: Self-pay | Admitting: Dermatology

## 2021-11-09 ENCOUNTER — Other Ambulatory Visit: Payer: Self-pay

## 2021-11-09 DIAGNOSIS — L720 Epidermal cyst: Secondary | ICD-10-CM | POA: Diagnosis not present

## 2021-11-09 DIAGNOSIS — L820 Inflamed seborrheic keratosis: Secondary | ICD-10-CM

## 2021-11-09 DIAGNOSIS — L219 Seborrheic dermatitis, unspecified: Secondary | ICD-10-CM

## 2021-11-09 DIAGNOSIS — L2089 Other atopic dermatitis: Secondary | ICD-10-CM

## 2021-11-09 MED ORDER — TRIAMCINOLONE ACETONIDE 0.1 % EX CREA
TOPICAL_CREAM | CUTANEOUS | 2 refills | Status: AC
Start: 2021-11-09 — End: ?

## 2021-11-09 NOTE — Progress Notes (Signed)
? ?Follow-Up Visit ?  ?Subjective  ?CEDRIC DENISON is a 76 y.o. female who presents for the following: Rash (Seborrheic dermatitis, ears. 3 month recheck. Using HC lotion. No longer itching, improved) and Seborrheic Keratosis (Back. Recheck ISK's. Tx with LN2). ?The patient has spots, moles and lesions to be evaluated, some may be new or changing and the patient has concerns that these could be cancer. ? ?The following portions of the chart were reviewed this encounter and updated as appropriate:  Tobacco  Allergies  Meds  Problems  Med Hx  Surg Hx  Fam Hx   ?  ?Review of Systems: No other skin or systemic complaints except as noted in HPI or Assessment and Plan. ? ?Objective  ?Well appearing patient in no apparent distress; mood and affect are within normal limits. ? ?A focused examination was performed including face, back. Relevant physical exam findings are noted in the Assessment and Plan. ? ?B/L Ears ?Mild pinkness ? ?back x15, forehead x1 (16) ?Erythematous keratotic or waxy stuck-on papule or plaque. ? ?right upper back ?Oval pink scaly patch ? ?Left Anterior Lobule ?Subcutaneous cystic papule ? ? ?Assessment & Plan  ?Seborrheic dermatitis ?B/L Ears ?Chronic and persistent condition with duration or expected duration over one year. Condition is bothersome/symptomatic for patient. Currently well controlled. ? ?Seborrheic Dermatitis  ?-  is a chronic persistent rash characterized by pinkness and scaling most commonly of the mid face but also can occur on the scalp (dandruff), ears; mid chest, mid back and groin.  It tends to be exacerbated by stress and cooler weather.  People who have neurologic disease may experience new onset or exacerbation of existing seborrheic dermatitis.  The condition is not curable but treatable and can be controlled.  ? ?Continue Hydrocortisone 2.5% cream as needed.  ? ?Related Medications ?hydrocortisone 2.5 % lotion ?Apply topically as directed. Apply to ears 3 nights  per week Monday, Wednesday, and Fridays ? ?Inflamed seborrheic keratosis (16) ?back x15, forehead x1 ?Destruction of lesion - back x15, forehead x1 ?Complexity: simple   ?Destruction method: cryotherapy   ?Informed consent: discussed and consent obtained   ?Timeout:  patient name, date of birth, surgical site, and procedure verified ?Lesion destroyed using liquid nitrogen: Yes   ?Region frozen until ice ball extended beyond lesion: Yes   ?Outcome: patient tolerated procedure well with no complications   ?Post-procedure details: wound care instructions given   ? ?Other atopic dermatitis ?right upper back ?Atopic dermatitis (eczema) is a chronic, relapsing, pruritic condition that can significantly affect quality of life. It is often associated with allergic rhinitis and/or asthma and can require treatment with topical medications, phototherapy, or in severe cases biologic injectable medication (Dupixent; Adbry) or Oral JAK inhibitors.  ? ?Start Triamcinolone cream twice daily to affected areas on back up to 2 weeks as needed for rash/itching. Avoid applying to face, groin, and axilla. Use as directed. Long-term use can cause thinning of the skin. ? ?triamcinolone cream (KENALOG) 0.1 % - right upper back ?Apply twice daily to affected area on back up to 2 weeks as needed for rash/itching. Avoid applying to face, groin, and axilla. ? ?Epidermal inclusion cyst ?Left Anterior earlobe ?Benign-appearing. Exam most consistent with an epidermal inclusion cyst. Discussed that a cyst is a benign growth that can grow over time and sometimes get irritated or inflamed. Recommend observation if it is not bothersome. Discussed option of surgical excision to remove it if it is growing, symptomatic, or other changes noted. Please call  for new or changing lesions so they can be evaluated. ?Patient declines surgery at this time. ? ?Return in about 1 year (around 11/10/2022) for TBSE. ? ?I, Emelia Salisbury, CMA, am acting as scribe for  Sarina Ser, MD. ?Documentation: I have reviewed the above documentation for accuracy and completeness, and I agree with the above. ? ?Sarina Ser, MD ? ? ?

## 2021-11-09 NOTE — Patient Instructions (Addendum)
Cryotherapy Aftercare ? ?Wash gently with soap and water everyday.   ?Apply Vaseline and Band-Aid daily until healed.  ? ? ?Prior to procedure, discussed risks of blister formation, small wound, skin dyspigmentation, or rare scar following cryotherapy. Recommend Vaseline ointment to treated areas while healing.  ? ?Start Triamcinolone cream twice daily to affected areas on back up to 2 weeks as needed for rash/itching. Avoid applying to face, groin, and axilla. Use as directed. Long-term use can cause thinning of the skin. ? ?Topical steroids (such as triamcinolone, fluocinolone, fluocinonide, mometasone, clobetasol, halobetasol, betamethasone, hydrocortisone) can cause thinning and lightening of the skin if they are used for too long in the same area. Your physician has selected the right strength medicine for your problem and area affected on the body. Please use your medication only as directed by your physician to prevent side effects.   ? ? ?If You Need Anything After Your Visit ? ?If you have any questions or concerns for your doctor, please call our main line at [phone] and press option 4 to reach your doctor's medical assistant. If no one answers, please leave a voicemail as directed and we will return your call as soon as possible. Messages left after 4 pm will be answered the following business day.  ? ?You may also send [place] a message via MyChart. We typically respond to MyChart messages within 1-2 business days. ? ?For prescription refills, please ask your pharmacy to contact our office. Our fax number is [phone]. ? ?If you have an urgent issue when the clinic is closed that cannot wait until the next business day, you can page your doctor at the number below.   ? ?Please note that while we do our best to be available for urgent issues outside of office hours, we are not available 24/7.  ? ?If you have an urgent issue and are unable to reach [place], you may choose to seek medical care at your  doctor's office, retail clinic, urgent care center, or emergency room. ? ?If you have a medical emergency, please immediately call 911 or go to the emergency department. ? ?Pager Numbers ? ?- Dr. Nehemiah Massed: [phone] ? ?- Dr. Laurence Ferrari: [phone] ? ?- Dr. Nicole Kindred: [phone] ? ?In the event of inclement weather, please call our main line at [phone] for an update on the status of any delays or closures. ? ?Dermatology Medication Tips: ?Please keep the boxes that topical medications come in in order to help keep track of the instructions about where and how to use these. Pharmacies typically print the medication instructions only on the boxes and not directly on the medication tubes.  ? ?If your medication is too expensive, please contact our office at [phone] option 4 or send [place] a message through [place].  ? ?We are unable to tell what your co-pay for medications will be in advance as this is different depending on your insurance coverage. However, we may be able to find a substitute medication at lower cost or fill out paperwork to get insurance to cover a needed medication.  ? ?If a prior authorization is required to get your medication covered by your insurance company, please allow [place] 1-2 business days to complete this process. ? ?Drug prices often vary depending on where the prescription is filled and some pharmacies may offer cheaper prices. ? ?The website www.goodrx.com contains coupons for medications through different pharmacies. The prices here do not account for what the cost may be with help from insurance (it may  be cheaper with your insurance), but the website can give you the price if you did not use any insurance.  ?- You can print the associated coupon and take it with your prescription to the pharmacy.  ?- You may also stop by our office during regular business hours and pick up a GoodRx coupon card.  ?- If you need your prescription sent electronically to a different pharmacy, notify  our office through [facility] or by phone at [phone] option 4. ? ? ? ? ?Si Usted Necesita Algo Despu?s de Su Visita ? ?Tambi?n puede enviarnos un mensaje a trav?s de MyChart. Por lo general respondemos a los mensajes de MyChart en el transcurso de 1 a 2 d?as h?biles. ? ?Para renovar recetas, por favor pida a su farmacia que se ponga en contacto con nuestra oficina. Nuestro n?mero de fax es el [phone]. ? ?Si tiene un asunto urgente cuando la cl?nica est? cerrada y que no puede esperar hasta el siguiente d?a h?bil, puede llamar/localizar a su doctor(a) al n?mero que aparece a continuaci?n.  ? ?Por favor, tenga en cuenta que aunque hacemos todo lo posible para estar disponibles para asuntos urgentes fuera del horario de oficina, no estamos disponibles las 24 horas del d?a, los 7 d?as de la semana.  ? ?Si tiene un problema urgente y no puede comunicarse con nosotros, puede optar por buscar atenci?n m?dica  en el consultorio de su doctor(a), en una cl?nica privada, en un centro de atenci?n urgente o en una sala de emergencias. ? ?Si tiene Engineer, maintenance (IT) m?dica, por favor llame inmediatamente al 911 o vaya a la sala de emergencias. ? ?N?meros de b?per ? ?- Dr. Nehemiah Massed: [phone] ? ?- Dra. Moye: [phone] ? ?- Dra. Nicole Kindred: [phone] ? ?En caso de inclemencias del tiempo, por favor llame a nuestra l?nea principal al [phone] para una actualizaci?n sobre el estado de cualquier retraso o cierre. ? ?Consejos para la medicaci?n en dermatolog?a: ?Por favor, guarde las cajas en las que vienen los medicamentos de uso t?pico para ayudarle a seguir las instrucciones sobre d?nde y c?mo usarlos. Las farmacias generalmente imprimen las instrucciones del medicamento s?lo en las cajas y no directamente en los tubos del [place].  ? ?Si su medicamento es muy caro, por favor, p?ngase en contacto con Zigmund Daniel llamando al [phone] y presione la opci?n 4 o env?enos un mensaje a trav?s de  MyChart.  ? ?No podemos decirle cu?l ser? su copago por los medicamentos por adelantado ya que esto es diferente dependiendo de la cobertura de su seguro. Sin embargo, es posible que podamos encontrar un medicamento sustituto a Electrical engineer un formulario para que el seguro cubra el medicamento que se considera necesario.  ? ?Si se requiere Ardelia Mems autorizaci?n previa para que su compa??a de seguros [place] su medicamento, por favor perm?tanos de 1 a 2 d?as h?biles para completar este proceso. ? ?Los precios de los medicamentos var?an con frecuencia dependiendo del Environmental consultant de d?nde se surte la receta y alguna farmacias pueden ofrecer precios m?s baratos. ? ?El sitio web www.goodrx.com tiene cupones para medicamentos de Airline pilot. Los precios aqu? no tienen en cuenta lo que podr?a costar con la ayuda del seguro (puede ser m?s barato con su seguro), pero el sitio web puede darle el precio si no utiliz? ning?n seguro.  ?- Puede imprimir el cup?n correspondiente y llevarlo con su receta a la farmacia.  ?- Tambi?n puede pasar por nuestra oficina durante el horario de atenci?n regular y  recoger una tarjeta de cupones de GoodRx.  ?- Si necesita que su receta se env?e electr?nicamente a Chiropodist, informe a nuestra oficina a trav?s de MyChart de [place] o por tel?fono llamando al [phone] y presione la opci?n 4.  ?

## 2021-11-14 ENCOUNTER — Encounter: Payer: Self-pay | Admitting: Dermatology

## 2021-12-07 ENCOUNTER — Encounter: Payer: Self-pay | Admitting: Obstetrics and Gynecology

## 2021-12-07 ENCOUNTER — Ambulatory Visit (INDEPENDENT_AMBULATORY_CARE_PROVIDER_SITE_OTHER): Payer: Medicare Other | Admitting: Obstetrics and Gynecology

## 2021-12-07 VITALS — BP 133/83 | HR 80 | Ht 64.0 in | Wt 123.0 lb

## 2021-12-07 DIAGNOSIS — N952 Postmenopausal atrophic vaginitis: Secondary | ICD-10-CM

## 2021-12-07 DIAGNOSIS — Z01419 Encounter for gynecological examination (general) (routine) without abnormal findings: Secondary | ICD-10-CM | POA: Diagnosis not present

## 2021-12-07 NOTE — Progress Notes (Signed)
HPI: ?     Christy [name] is a 76 y.o. [phone] who LMP was No LMP recorded. Patient is postmenopausal. ? ?Subjective:  ? ?She presents today for her annual examination.  She has no specific complaints.  She does state that she uses the estrogen vaginal cream as directed and she would like to continue it to maintain the health of vaginal tissues as it relates to the urethra and urinary incontinence. ?Of significant note, patient has had a hysterectomy. ?Her sister-in-law has been diagnosed later in life with breast cancer and so Ms. Falor would like to continue receiving annual mammography. ? ?  Hx: ?The following portions of the patient's history were reviewed and updated as appropriate: ?            She  has a past medical history of Arthritis, Atrophy of vagina, Cystocele, GERD (gastroesophageal reflux disease), Glaucoma, Heart murmur, Rectocele, and Uveitis. ?She does not have any pertinent problems on file. ?She  has a past surgical history that includes Cataract extraction, bilateral; Tubal ligation; Eye surgery (Bilateral); Vaginal hysterectomy (Bilateral, 02/12/2018); Abdominal hysterectomy; Colonoscopy with propofol (N/A, 02/12/2019); and Esophagogastroduodenoscopy (egd) with propofol (N/A, 02/12/2019). ?Her family history includes Aortic stenosis in her mother; Breast cancer in an other family member; Cancer in her father; Heart disease in her father and mother; Multiple sclerosis in her daughter; Osteoporosis in her mother. ?She  reports that she has never smoked. She has never used smokeless tobacco. She reports current alcohol use of about 1.0 standard drink per week. She reports that she does not use drugs. ?She has a current medication list which includes the following prescription(s): atorvastatin, brimonidine, calcium carbonate-vitamin d, cholecalciferol, cranberry, denosumab, dorzolamide-timolol, estradiol, glucosamine-chondroitin, hydrocortisone, multivitamin, fish oil, and triamcinolone  cream. ?She is allergic to xalatan [latanoprost]. ?      ?Review of Systems:  ?Review of Systems ? ?Constitutional: Denied constitutional symptoms, night sweats, recent illness, fatigue, fever, insomnia and weight loss.  ?Eyes: Denied eye symptoms, eye pain, photophobia, vision change and visual disturbance.  ?Ears/Nose/Throat/Neck: Denied ear, nose, throat or neck symptoms, hearing loss, nasal discharge, sinus congestion and sore throat.  ?Cardiovascular: Denied cardiovascular symptoms, arrhythmia, chest pain/pressure, edema, exercise intolerance, orthopnea and palpitations.  ?Respiratory: Denied pulmonary symptoms, asthma, pleuritic pain, productive sputum, cough, dyspnea and wheezing.  ?Gastrointestinal: Denied, gastro-esophageal reflux, melena, nausea and vomiting.  ?Genitourinary: Denied genitourinary symptoms including symptomatic vaginal discharge, pelvic relaxation issues, and urinary complaints.  ?Musculoskeletal: Denied musculoskeletal symptoms, stiffness, swelling, muscle weakness and myalgia.  ?Dermatologic: Denied dermatology symptoms, rash and scar.  ?Neurologic: Denied neurology symptoms, dizziness, headache, neck pain and syncope.  ?Psychiatric: Denied psychiatric symptoms, anxiety and depression.  ?Endocrine: Denied endocrine symptoms including hot flashes and night sweats.  ? ?Meds: ?  ?Current Outpatient Medications on File Prior to Visit  ?Medication Sig Dispense Refill  ? atorvastatin (LIPITOR) 10 MG tablet Take 1 tablet (10 mg total) by mouth daily. for cholesterol. 90 tablet 3  ? brimonidine (ALPHAGAN) 0.2 % ophthalmic solution Place 1 drop into the left eye 2 (two) times daily.     ? CALCIUM CARBONATE-VITAMIN D PO Take 1 tablet by mouth 2 (two) times daily.     ? cholecalciferol (VITAMIN D) 1000 UNITS tablet Take 1,000 Units by mouth daily.    ? Cranberry 500 MG CAPS Take 500 mg by mouth daily.     ? denosumab (PROLIA) 60 MG/ML SOLN injection Inject 60 mg into the skin every 6 (six) months.  Administer in  upper arm, thigh, or abdomen    ? dorzolamide-timolol (COSOPT) 22.3-6.8 MG/ML ophthalmic solution Place 1 drop into the left eye every 12 (twelve) hours.    ? estradiol (ESTRACE) 0.1 MG/GM vaginal cream INSERT 1/2 GRAM VAGINALLY TWICE A WEEK 43 g 3  ? GLUCOSAMINE CHONDROITIN COMPLX PO Take 1 tablet by mouth daily.     ? hydrocortisone 2.5 % lotion Apply topically as directed. Apply to ears 3 nights per week Monday, Wednesday, and Fridays 59 mL 6  ? Multiple Vitamin (MULTIVITAMIN) tablet Take 1 tablet by mouth daily.    ? Omega-3 Fatty Acids (FISH OIL) 1200 MG CAPS Take 2,400 mg by mouth daily.     ? triamcinolone cream (KENALOG) 0.1 % Apply twice daily to affected area on back up to 2 weeks as needed for rash/itching. Avoid applying to face, groin, and axilla. 30 g 2  ? ?No current facility-administered medications on file prior to visit.  ? ? ? ?Objective:  ?  ? ?Vitals:  ? 12/07/21 1448  ?BP: 133/83  ?Pulse: 80  ?  ?Filed Weights  ? 12/07/21 1448  ?Weight: 123 lb (55.8 kg)  ? ?  ?         Physical examination ?General NAD, Conversant  ?HEENT Atraumatic; Op clear with mmm.  Normo-cephalic. Pupils reactive. Anicteric sclerae  ?Thyroid/Neck Smooth without nodularity or enlargement. Normal ROM.  Neck Supple.  ?Skin No rashes, lesions or ulceration. Normal palpated skin turgor. No nodularity.  ?Breasts: No masses or discharge.  Symmetric.  No axillary adenopathy.  ?Lungs: Clear to auscultation.No rales or wheezes. Normal Respiratory effort, no retractions.  ?Heart: NSR.  No murmurs or rubs appreciated. No periferal edema  ?Abdomen: Soft.  Non-tender.  No masses.  No HSM. No hernia  ?Extremities: Moves all appropriately.  Normal ROM for age. No lymphadenopathy.  ?Neuro: Oriented to PPT.  Normal mood. Normal affect.  ? ?  Pelvic:   ?Vulva: Normal appearance.  No lesions.   ?Vagina: No lesions or abnormalities noted.  Moderate atrophy noted  ?Support: Normal pelvic support.  ?Urethra No masses tenderness or  scarring.  ?Meatus Normal size without lesions or prolapse.  ?Cervix: Surgically absent   ?Anus: Normal exam.  No lesions.  ?Perineum: Normal exam.  No lesions.  ?      Bimanual   ?Uterus: Surgically absent   ?Adnexae: No masses.  Non-tender to palpation.  ?Cul-de-sac: Negative for abnormality.  ? ? ? ?Assessment:  ?  ?G2P2002 ?Patient Active Problem List  ? Diagnosis Date Noted  ? Post-nasal drip 08/07/2020  ? Chronic pain of right hip 08/07/2020  ? Hyperlipidemia 07/10/2019  ? Prediabetes 07/10/2019  ? Healthcare maintenance 07/06/2018  ? Atrophy of vagina 07/04/2018  ? S/P TVHBSO with Anterior/Posterior Colporrhaphy 02/12/2018  ? Glaucoma   ? Uveitis   ? GERD (gastroesophageal reflux disease) 06/09/2015  ? Osteoporosis 09/02/2013  ? Encounter for Medicare annual wellness exam 05/06/2013  ? Arthritis   ? Seborrheic keratosis 01/26/1979  ? ?  ?1. Well woman exam with routine gynecological exam   ?2. Vaginal atrophy   ? ?  ? ? ?Plan:  ?  ?       ? 1.  Basic Screening Recommendations ?The basic screening recommendations for asymptomatic women were discussed with the patient during her visit.  The age-appropriate recommendations were discussed with her and the rational for the tests reviewed.  When I am informed by the patient that another primary care physician has previously obtained the  age-appropriate tests and they are up-to-date, only outstanding tests are ordered and referrals given as necessary.  Abnormal results of tests will be discussed with her when all of her results are completed.  Routine preventative health maintenance measures emphasized: Exercise/Diet/Weight control, Tobacco Warnings, Alcohol/Substance use risks and Stress Management ?Patient to continue mammography ?2.  Continue vaginal estrogen-May increase to every other night as directed. ? ?Orders ?No orders of the defined types were placed in this encounter. ? ? No orders of the defined types were placed in this encounter. ?    ? ? ?   F/U ? Return in about 1 year (around 12/08/2022) for Annual Physical. ? ?Finis Bud, M.D. ?12/07/2021 ?3:21 PM ? ? ? ?

## 2021-12-07 NOTE — Progress Notes (Signed)
Patients presents for annual exam today. Patient states she is continuing to use Estradiol with no issue. Patient states no other questions or concerns at this time.   ?

## 2022-01-18 DIAGNOSIS — Z23 Encounter for immunization: Secondary | ICD-10-CM | POA: Diagnosis not present

## 2022-01-28 ENCOUNTER — Telehealth: Payer: Self-pay

## 2022-01-28 DIAGNOSIS — M810 Age-related osteoporosis without current pathological fracture: Secondary | ICD-10-CM

## 2022-01-28 NOTE — Telephone Encounter (Signed)
Benefits submitted-pending Next injection due 02/18/22

## 2022-02-01 NOTE — Addendum Note (Signed)
Addended by: Kris Mouton on: 02/01/2022 04:22 PM   Modules accepted: Orders

## 2022-02-01 NOTE — Telephone Encounter (Signed)
OOP cost is $0. Patient advised Lab 02/02/22 and NV 02/17/22

## 2022-02-02 ENCOUNTER — Telehealth: Payer: Self-pay | Admitting: Primary Care

## 2022-02-02 ENCOUNTER — Other Ambulatory Visit (INDEPENDENT_AMBULATORY_CARE_PROVIDER_SITE_OTHER): Payer: Medicare Other

## 2022-02-02 DIAGNOSIS — M810 Age-related osteoporosis without current pathological fracture: Secondary | ICD-10-CM

## 2022-02-02 LAB — BASIC METABOLIC PANEL
BUN: 16 mg/dL (ref 6–23)
CO2: 29 mEq/L (ref 19–32)
Calcium: 9.6 mg/dL (ref 8.4–10.5)
Chloride: 102 mEq/L (ref 96–112)
Creatinine, Ser: 0.7 mg/dL (ref 0.40–1.20)
GFR: 83.54 mL/min (ref >60.00)
Glucose, Bld: 82 mg/dL (ref 70–99)
Potassium: 3.9 mEq/L (ref 3.5–5.1)
Sodium: 139 mEq/L (ref 135–145)

## 2022-02-02 NOTE — Telephone Encounter (Signed)
error 

## 2022-02-17 ENCOUNTER — Ambulatory Visit (INDEPENDENT_AMBULATORY_CARE_PROVIDER_SITE_OTHER): Payer: Medicare Other

## 2022-02-17 DIAGNOSIS — M810 Age-related osteoporosis without current pathological fracture: Secondary | ICD-10-CM

## 2022-02-17 MED ORDER — DENOSUMAB 60 MG/ML ~~LOC~~ SOSY
60.0000 mg | PREFILLED_SYRINGE | Freq: Once | SUBCUTANEOUS | Status: AC
  Administered 2022-02-17: 60 mg via SUBCUTANEOUS

## 2022-02-17 NOTE — Telephone Encounter (Signed)
Pt given Prolia inj today.  Fyi to Anastasiya.

## 2022-02-17 NOTE — Progress Notes (Signed)
Per orders of Katherine Clark,NP, injection of Prolia given by Lyriq Finerty. Patient tolerated injection well.  

## 2022-04-04 ENCOUNTER — Other Ambulatory Visit: Payer: Self-pay | Admitting: Obstetrics and Gynecology

## 2022-04-04 DIAGNOSIS — N952 Postmenopausal atrophic vaginitis: Secondary | ICD-10-CM

## 2022-04-19 DIAGNOSIS — H401122 Primary open-angle glaucoma, left eye, moderate stage: Secondary | ICD-10-CM | POA: Diagnosis not present

## 2022-06-07 DIAGNOSIS — Z23 Encounter for immunization: Secondary | ICD-10-CM | POA: Diagnosis not present

## 2022-06-23 ENCOUNTER — Other Ambulatory Visit: Payer: Self-pay | Admitting: Primary Care

## 2022-06-23 DIAGNOSIS — Z1231 Encounter for screening mammogram for malignant neoplasm of breast: Secondary | ICD-10-CM

## 2022-07-01 DIAGNOSIS — Z23 Encounter for immunization: Secondary | ICD-10-CM | POA: Diagnosis not present

## 2022-07-22 ENCOUNTER — Telehealth: Payer: Self-pay

## 2022-07-22 NOTE — Telephone Encounter (Signed)
Pt ready for scheduling on or after 08/17/22  Out-of-pocket cost due at time of visit: $0  Primary: Medicare Prolia co-insurance: 0% Admin fee co-insurance: 0%  Secondary:  Prolia co-insurance:  Admin fee co-insurance:   Deductible: $226 met of $226 required  Prior Auth:  PA# Valid:   ** This summary of benefits is an estimation of the patient's out-of-pocket cost. Exact cost may vary based on individual plan coverage.

## 2022-07-25 ENCOUNTER — Telehealth: Payer: Medicare Other | Admitting: Physician Assistant

## 2022-07-25 DIAGNOSIS — U071 COVID-19: Secondary | ICD-10-CM | POA: Diagnosis not present

## 2022-07-25 MED ORDER — NIRMATRELVIR/RITONAVIR (PAXLOVID)TABLET: 3.0000 | ORAL_TABLET | Freq: Two times a day (BID) | ORAL | 0 refills | Status: AC

## 2022-07-25 NOTE — Patient Instructions (Signed)
Christy [name], thank you for joining Mar Daring, PA-C for today's virtual visit.  While this provider is not your primary care provider (PCP), if your PCP is located in our provider database this encounter information will be shared with them immediately following your visit.   [facility] account gives you access to today's visit and all your visits, tests, and labs performed at [facility] " click here if you don't have a [facility] account or go to [email or web address]  Consent: (Patient) Christy [name] provided verbal consent for this virtual visit at the beginning of the encounter.  Current Medications:  Current Outpatient Medications:    nirmatrelvir/ritonavir EUA (PAXLOVID) 20 x 150 MG & 10 x '100MG'$  TABS, Take 3 tablets by mouth 2 (two) times daily for 5 days. (Take nirmatrelvir 150 mg two tablets twice daily for 5 days and ritonavir 100 mg one tablet twice daily for 5 days) Patient GFR is 83, Disp: 30 tablet, Rfl: 0   atorvastatin (LIPITOR) 10 MG tablet, Take 1 tablet (10 mg total) by mouth daily. for cholesterol., Disp: 90 tablet, Rfl: 3   brimonidine (ALPHAGAN) 0.2 % ophthalmic solution, Place 1 drop into the left eye 2 (two) times daily. , Disp: , Rfl:    CALCIUM CARBONATE-VITAMIN D PO, Take 1 tablet by mouth 2 (two) times daily. , Disp: , Rfl:    cholecalciferol (VITAMIN D) 1000 UNITS tablet, Take 1,000 Units by mouth daily., Disp: , Rfl:    Cranberry 500 MG CAPS, Take 500 mg by mouth daily. , Disp: , Rfl:    denosumab (PROLIA) 60 MG/ML SOLN injection, Inject 60 mg into the skin every 6 (six) months. Administer in upper arm, thigh, or abdomen, Disp: , Rfl:    dorzolamide-timolol (COSOPT) 22.3-6.8 MG/ML ophthalmic solution, Place 1 drop into the left eye every 12 (twelve) hours., Disp: , Rfl:    estradiol (ESTRACE) 0.1 MG/GM vaginal cream, INSERT ONE-HALF GRAM VAGINALLY TWICE WEEKLY, Disp: 43 g, Rfl: 0   GLUCOSAMINE CHONDROITIN COMPLX  PO, Take 1 tablet by mouth daily. , Disp: , Rfl:    hydrocortisone 2.5 % lotion, Apply topically as directed. Apply to ears 3 nights per week Monday, Wednesday, and Fridays, Disp: 59 mL, Rfl: 6   Multiple Vitamin (MULTIVITAMIN) tablet, Take 1 tablet by mouth daily., Disp: , Rfl:    Omega-3 Fatty Acids (FISH OIL) 1200 MG CAPS, Take 2,400 mg by mouth daily. , Disp: , Rfl:    triamcinolone cream (KENALOG) 0.1 %, Apply twice daily to affected area on back up to 2 weeks as needed for rash/itching. Avoid applying to face, groin, and axilla., Disp: 30 g, Rfl: 2   Medications ordered in this encounter:  Meds ordered this encounter  Medications   nirmatrelvir/ritonavir EUA (PAXLOVID) 20 x 150 MG & 10 x '100MG'$  TABS    Sig: Take 3 tablets by mouth 2 (two) times daily for 5 days. (Take nirmatrelvir 150 mg two tablets twice daily for 5 days and ritonavir 100 mg one tablet twice daily for 5 days) Patient GFR is 83    Dispense:  30 tablet    Refill:  0    Order Specific Question:   Supervising Provider    Answer:   Chase Picket [id number]     *If you need refills on other medications prior to your next appointment, please contact your pharmacy*  Follow-Up: Call back or seek an in-person evaluation if the symptoms worsen or if the condition  fails to improve as anticipated.  [place] [phone]  Other Instructions Can take to lessen severity: Vit C '500mg'$  twice daily Quercertin 250-'500mg'$  twice daily Zinc 75-'100mg'$  daily Melatonin 3-6 mg at bedtime Vit D3 1000-2000 IU daily Aspirin 81 mg daily with food Optional: Famotidine '20mg'$  daily Also can add tylenol/ibuprofen as needed for fevers and body aches May add Mucinex or Mucinex DM as needed for cough/congestion   COVID-19 COVID-19, or coronavirus disease 2019, is an infection that is caused by a new (novel) coronavirus called SARS-CoV-2. COVID-19 can cause many symptoms. In some people, the virus may not cause any symptoms. In  others, it may cause mild or severe symptoms. Some people with severe infection develop severe disease. What are the causes? This illness is caused by a virus. The virus may be in the air as tiny specks of fluid (aerosols) or droplets, or it may be on surfaces. You may catch the virus by: Breathing in droplets from an infected person. Droplets can be spread by a person breathing, speaking, singing, coughing, or sneezing. Touching something, like a table or a doorknob, that has virus on it (is contaminated) and then touching your mouth, nose, or eyes. What increases the risk? Risk for infection: You are more likely to get infected with the COVID-19 virus if: You are within 6 ft (1.8 m) of a person with COVID-19 for 15 minutes or longer. You are providing care for a person who is infected with COVID-19. You are in close personal contact with other people. Close personal contact includes hugging, kissing, or sharing eating or drinking utensils. Risk for serious illness caused by COVID-19: You are more likely to get seriously ill from the COVID-19 virus if: You have cancer. You have a long-term (chronic) disease, such as: Chronic lung disease. This includes pulmonary embolism, chronic obstructive pulmonary disease, and cystic fibrosis. Long-term disease that lowers your body's ability to fight infection (immunocompromise). Serious cardiac conditions, such as heart failure, coronary artery disease, or cardiomyopathy. Diabetes. Chronic kidney disease. Liver diseases. These include cirrhosis, nonalcoholic fatty liver disease, alcoholic liver disease, or autoimmune hepatitis. You have obesity. You are pregnant or were recently pregnant. You have sickle cell disease. What are the signs or symptoms? Symptoms of this condition can range from mild to severe. Symptoms may appear any time from 2 to 14 days after being exposed to the virus. They include: Fever or chills. Shortness of breath or trouble  breathing. Feeling tired or very tired. Headaches, body aches, or muscle aches. Runny or stuffy nose, sneezing, coughing, or sore throat. New loss of taste or smell. This is rare. Some people may also have stomach problems, such as nausea, vomiting, or diarrhea. Other people may not have any symptoms of COVID-19. How is this diagnosed? This condition may be diagnosed by testing samples to check for the COVID-19 virus. The most common tests are the PCR test and the antigen test. Tests may be done in the lab or at home. They include: Using a swab to take a sample of fluid from the back of your nose and throat (nasopharyngeal fluid), from your nose, or from your throat. Testing a sample of saliva from your mouth. Testing a sample of coughed-up mucus from your lungs (sputum). How is this treated? Treatment for COVID-19 infection depends on the severity of the condition. Mild symptoms can be managed at home with rest, fluids, and over-the-counter medicines. Serious symptoms may be treated in a hospital intensive care unit (ICU).  Treatment in the ICU may include: Supplemental oxygen. Extra oxygen is given through a tube in the nose, a face mask, or a hood. Medicines. These may include: Antivirals, such as monoclonal antibodies. These help your body fight off certain viruses that can cause disease. Anti-inflammatories, such as corticosteroids. These reduce inflammation and suppress the immune system. Antithrombotics. These prevent or treat blood clots, if they develop. Convalescent plasma. This helps boost your immune system, if you have an underlying immunosuppressive condition or are getting immunosuppressive treatments. Prone positioning. This means you will lie on your stomach. This helps oxygen to get into your lungs. Infection control measures. If you are at risk for more serious illness caused by COVID-19, your health care provider may prescribe two long-acting monoclonal antibodies, given  together every 6 months. How is this prevented? To protect yourself: Use preventive medicine (pre-exposure prophylaxis). You may get pre-exposure prophylaxis if you have moderate or severe immunocompromise. Get vaccinated. Anyone 25 months old or older who meets guidelines can get a COVID-19 vaccine or vaccine series. This includes people who are pregnant or making breast milk (lactating). Get an added dose of COVID-19 vaccine after your first vaccine or vaccine series if you have moderate to severe immunocompromise. This applies if you have had a solid organ transplant or have been diagnosed with an immunocompromising condition. You should get the added dose 4 weeks after you got the first COVID-19 vaccine or vaccine series. If you get an mRNA vaccine, you will need a 3-dose primary series. If you get the J&J/Janssen vaccine, you will need a 2-dose primary series, with the second dose being an mRNA vaccine. Talk to your health care provider about getting experimental monoclonal antibodies. This treatment is approved under emergency use authorization to prevent severe illness before or after being exposed to the COVID-19 virus. You may be given monoclonal antibodies if: You have moderate or severe immunocompromise. This includes treatments that lower your immune response. People with immunocompromise may not develop protection against COVID-19 when they are vaccinated. You cannot be vaccinated. You may not get a vaccine if you have a severe allergic reaction to the vaccine or its components. You are not fully vaccinated. You are in a facility where COVID-19 is present and: Are in close contact with a person who is infected with the COVID-19 virus. Are at high risk of being exposed to the COVID-19 virus. You are at risk of illness from new variants of the COVID-19 virus. To protect others: If you have symptoms of COVID-19, take steps to prevent the virus from spreading to others. Stay home. Leave  your house only to get medical care. Do not use public transit, if possible. Do not travel while you are sick. Wash your hands often with soap and water for at least 20 seconds. If soap and water are not available, use alcohol-based hand sanitizer. Make sure that all people in your household wash their hands well and often. Cough or sneeze into a tissue or your sleeve or elbow. Do not cough or sneeze into your hand or into the air. Where to find more information Centers for Disease Control and Prevention: CharmCourses.be World Health Organization: https://www.castaneda.info/ Get help right away if: You have trouble breathing. You have pain or pressure in your chest. You are confused. You have bluish lips and fingernails. You have trouble waking from sleep. You have symptoms that get worse. These symptoms may be an emergency. Get help right away. Call 911. Do not wait to see if  the symptoms will go away. Do not drive yourself to the hospital. Summary COVID-19 is an infection that is caused by a new coronavirus. Sometimes, there are no symptoms. Other times, symptoms range from mild to severe. Some people with a severe COVID-19 infection develop severe disease. The virus that causes COVID-19 can spread from person to person through droplets or aerosols from breathing, speaking, singing, coughing, or sneezing. Mild symptoms of COVID-19 can be managed at home with rest, fluids, and over-the-counter medicines. This information is not intended to replace advice given to you by your health care provider. Make sure you discuss any questions you have with your health care provider. Document Revised: 07/27/2021 Document Reviewed: 07/29/2021 Elsevier Patient Education  [place].    If you have been instructed to have an in-person evaluation today at a local Urgent Care facility, please use the link below. It will take you to a list of all of our available [facility], including address, phone number and hours of operation. Please do not delay care.  [facility]  If you or a family member do not have a primary care provider, use the link below to schedule a visit and establish care. When you choose a [facility] primary care physician or advanced practice provider, you gain a long-term partner in health. Find a Primary Care Provider  Learn more about [facility]'s in-office and virtual care options: [facility]

## 2022-07-25 NOTE — Progress Notes (Signed)
Virtual Visit Consent   Christy [name], you are scheduled for a virtual visit with a [facility] provider today. Just as with appointments in the office, your consent must be obtained to participate. Your consent will be active for this visit and any virtual visit you may have with one of our providers in the next 365 days. If you have a MyChart account, a copy of this consent can be sent to you electronically.  As this is a virtual visit, video technology does not allow for your provider to perform a traditional examination. This may limit your provider's ability to fully assess your condition. If your provider identifies any concerns that need to be evaluated in person or the need to arrange testing (such as labs, EKG, etc.), we will make arrangements to do so. Although advances in technology are sophisticated, we cannot ensure that it will always work on either your end or our end. If the connection with a video visit is poor, the visit may have to be switched to a telephone visit. With either a video or telephone visit, we are not always able to ensure that we have a secure connection.  By engaging in this virtual visit, you consent to the provision of healthcare and authorize for your insurance to be billed (if applicable) for the services provided during this visit. Depending on your insurance coverage, you may receive a charge related to this service.  I need to obtain your verbal consent now. Are you willing to proceed with your visit today? Christy [name] has provided verbal consent on 07/25/2022 for a virtual visit (video or telephone). Mar Daring, PA-C  Date: 07/25/2022 8:16 AM  Virtual Visit via Video Note   I, Mar Daring, connected with  Christy [name]  ([id number], 76) on 07/25/22 at  8:00 AM EST by a video-enabled telemedicine application and verified that I am speaking with the correct person using two identifiers.  Location: Patient: Virtual Visit  Location Patient: Home Provider: Virtual Visit Location Provider: Home Office   I discussed the limitations of evaluation and management by telemedicine and the availability of in person appointments. The patient expressed understanding and agreed to proceed.    History of Present Illness: Christy [name] is a 76 y.o. who identifies as a female who was assigned female at birth, and is being seen today for Covid 51.  HPI: URI  This is a new problem. The current episode started in the past 7 days (Tested postive on at home test for covid 19 last night; symptoms started Friday evening; did test Saturday and had been negative.). The problem has been gradually worsening. The maximum temperature recorded prior to her arrival was 100.4 - 100.9 F (100.3). The fever has been present for Less than 1 day. Associated symptoms include congestion, coughing (dry), headaches, rhinorrhea, sinus pain and a sore throat. Pertinent negatives include no diarrhea, ear pain, nausea, plugged ear sensation, vomiting or wheezing. Associated symptoms comments: Myalgias, fatigue, no appetite. She has tried increased fluids for the symptoms. The treatment provided no relief.     Problems:  Patient Active Problem List   Diagnosis Date Noted   Post-nasal drip 08/07/2020   Chronic pain of right hip 08/07/2020   Hyperlipidemia 07/10/2019   Prediabetes 07/10/2019   Healthcare maintenance 07/06/2018   Atrophy of vagina 07/04/2018   S/P TVHBSO with Anterior/Posterior Colporrhaphy 02/12/2018   Glaucoma    Uveitis    GERD (gastroesophageal reflux disease) 06/09/2015  Osteoporosis 09/02/2013   Encounter for Medicare annual wellness exam 05/06/2013   Arthritis    Seborrheic keratosis 01/26/1979    Allergies:  Allergies  Allergen Reactions   Xalatan [Latanoprost] Itching, Swelling and Other (See Comments)    Caused eye to be red, swollen,itchy   Medications:  Current Outpatient Medications:    nirmatrelvir/ritonavir  EUA (PAXLOVID) 20 x 150 MG & 10 x '100MG'$  TABS, Take 3 tablets by mouth 2 (two) times daily for 5 days. (Take nirmatrelvir 150 mg two tablets twice daily for 5 days and ritonavir 100 mg one tablet twice daily for 5 days) Patient GFR is 83, Disp: 30 tablet, Rfl: 0   atorvastatin (LIPITOR) 10 MG tablet, Take 1 tablet (10 mg total) by mouth daily. for cholesterol., Disp: 90 tablet, Rfl: 3   brimonidine (ALPHAGAN) 0.2 % ophthalmic solution, Place 1 drop into the left eye 2 (two) times daily. , Disp: , Rfl:    CALCIUM CARBONATE-VITAMIN D PO, Take 1 tablet by mouth 2 (two) times daily. , Disp: , Rfl:    cholecalciferol (VITAMIN D) 1000 UNITS tablet, Take 1,000 Units by mouth daily., Disp: , Rfl:    Cranberry 500 MG CAPS, Take 500 mg by mouth daily. , Disp: , Rfl:    denosumab (PROLIA) 60 MG/ML SOLN injection, Inject 60 mg into the skin every 6 (six) months. Administer in upper arm, thigh, or abdomen, Disp: , Rfl:    dorzolamide-timolol (COSOPT) 22.3-6.8 MG/ML ophthalmic solution, Place 1 drop into the left eye every 12 (twelve) hours., Disp: , Rfl:    estradiol (ESTRACE) 0.1 MG/GM vaginal cream, INSERT ONE-HALF GRAM VAGINALLY TWICE WEEKLY, Disp: 43 g, Rfl: 0   GLUCOSAMINE CHONDROITIN COMPLX PO, Take 1 tablet by mouth daily. , Disp: , Rfl:    hydrocortisone 2.5 % lotion, Apply topically as directed. Apply to ears 3 nights per week Monday, Wednesday, and Fridays, Disp: 59 mL, Rfl: 6   Multiple Vitamin (MULTIVITAMIN) tablet, Take 1 tablet by mouth daily., Disp: , Rfl:    Omega-3 Fatty Acids (FISH OIL) 1200 MG CAPS, Take 2,400 mg by mouth daily. , Disp: , Rfl:    triamcinolone cream (KENALOG) 0.1 %, Apply twice daily to affected area on back up to 2 weeks as needed for rash/itching. Avoid applying to face, groin, and axilla., Disp: 30 g, Rfl: 2  Observations/Objective: Patient is well-developed, well-nourished in no acute distress.  Resting comfortably at home.  Head is normocephalic, atraumatic.  No labored  breathing.  Speech is clear and coherent with logical content.  Patient is alert and oriented at baseline.    Assessment and Plan: 1. COVID-19 - nirmatrelvir/ritonavir EUA (PAXLOVID) 20 x 150 MG & 10 x '100MG'$  TABS; Take 3 tablets by mouth 2 (two) times daily for 5 days. (Take nirmatrelvir 150 mg two tablets twice daily for 5 days and ritonavir 100 mg one tablet twice daily for 5 days) Patient GFR is 83  Dispense: 30 tablet; Refill: 0  - Continue OTC symptomatic management of choice - Will send OTC vitamins and supplement information through AVS - Paxlovid prescribed; hold atorvastatin - Patient enrolled in MyChart symptom monitoring - Push fluids - Rest as needed - Discussed return precautions and when to seek in-person evaluation, sent via AVS as well   Follow Up Instructions: I discussed the assessment and treatment plan with the patient. The patient was provided an opportunity to ask questions and all were answered. The patient agreed with the plan and demonstrated an understanding of  the instructions.  A copy of instructions were sent to the patient via MyChart unless otherwise noted below.    The patient was advised to call back or seek an in-person evaluation if the symptoms worsen or if the condition fails to improve as anticipated.  Time:  I spent 15 minutes with the patient via telehealth technology discussing the above problems/concerns.    Mar Daring, PA-C

## 2022-07-26 ENCOUNTER — Telehealth: Payer: Self-pay

## 2022-07-26 ENCOUNTER — Other Ambulatory Visit (HOSPITAL_COMMUNITY): Payer: Self-pay

## 2022-07-26 NOTE — Telephone Encounter (Signed)
Pharmacy Patient Advocate Encounter  Prolia due 08/20/22  Insurance verification completed.    The patient is insured through [employer] ([place])   Product manager for: Prolia.  Pharmacy benefit copay: $780.93

## 2022-08-05 NOTE — Telephone Encounter (Signed)
Probably will not need a prior authorization but will have to run another verification of benefits. As long as everything stays the same, it should be the same amount out-of-pocket.

## 2022-08-05 NOTE — Telephone Encounter (Signed)
Patient would like to have done after first of the year does she need to have another [place] submitted.

## 2022-08-18 NOTE — Telephone Encounter (Signed)
Not able to reach patient please resubmit after first of year.

## 2022-08-25 NOTE — Telephone Encounter (Signed)
Prolia VOB initiated via [email or web address] 

## 2022-08-29 NOTE — Telephone Encounter (Signed)
Pt ready for scheduling on or after 08/29/22  Out-of-pocket cost due at time of visit: $  Primary: [employer] Medicare  Prolia co-insurance: 20% Admin fee co-insurance: 0%  Secondary:  Prolia co-insurance:  Admin fee co-insurance:   Deductible: $240  Prior Auth: not required PA# Valid:     ** This summary of benefits is an estimation of the patient's out-of-pocket cost. Exact cost may very based on individual plan coverage.

## 2022-08-30 ENCOUNTER — Ambulatory Visit
Admission: RE | Admit: 2022-08-30 | Discharge: 2022-08-30 | Disposition: A | Discharge status: Home / Self Care | Payer: Medicare Other | Source: Ambulatory Visit | Attending: Primary Care | Admitting: Primary Care

## 2022-08-30 DIAGNOSIS — Z1231 Encounter for screening mammogram for malignant neoplasm of breast: Secondary | ICD-10-CM

## 2022-09-01 NOTE — Telephone Encounter (Signed)
Patient wanted to check she has a supplemental and thinks that it paid for all of it in the past. Will she still have the 240 due?

## 2022-09-08 NOTE — Telephone Encounter (Signed)
Left message to return call to our office.  

## 2022-09-08 NOTE — Telephone Encounter (Signed)
Patient called back in returning call she missed.

## 2022-09-09 ENCOUNTER — Telehealth: Payer: Self-pay | Admitting: Primary Care

## 2022-09-09 NOTE — Telephone Encounter (Signed)
Pt called returning Joellen's call regarding prolia inj. Call back # [phone]

## 2022-09-12 ENCOUNTER — Other Ambulatory Visit (INDEPENDENT_AMBULATORY_CARE_PROVIDER_SITE_OTHER): Payer: Medicare Other

## 2022-09-12 ENCOUNTER — Other Ambulatory Visit: Payer: Self-pay

## 2022-09-12 DIAGNOSIS — M810 Age-related osteoporosis without current pathological fracture: Secondary | ICD-10-CM

## 2022-09-12 NOTE — Telephone Encounter (Signed)
Duplicate message regarding scheduling porlia will close this message.

## 2022-09-13 LAB — BASIC METABOLIC PANEL
BUN: 16 mg/dL (ref 6–23)
CO2: 29 mEq/L (ref 19–32)
Calcium: 9 mg/dL (ref 8.4–10.5)
Chloride: 102 mEq/L (ref 96–112)
Creatinine, Ser: 0.66 mg/dL (ref 0.40–1.20)
GFR: 84.37 mL/min (ref >60.00)
Glucose, Bld: 94 mg/dL (ref 70–99)
Potassium: 3.8 mEq/L (ref 3.5–5.1)
Sodium: 138 mEq/L (ref 135–145)

## 2022-09-13 NOTE — Telephone Encounter (Signed)
Allie Bossier NP on 09/12/22 lab result note said ok to proceed with prolia. Pt has nurse visit scheduled for 09/15/22. Is it ok for pt to get prolia injection on 09/15/22? Thank you. Sending note to [employer] CMA team lead for confirmation OK to give prolia.

## 2022-09-13 NOTE — Telephone Encounter (Signed)
All documentation logged in and updated in appointment notes.  No further action needed at this time.

## 2022-09-13 NOTE — Telephone Encounter (Signed)
Results: 62.87 mL/min is your estimated creatinine clearance. Ok to receive Prolia inj.

## 2022-09-15 ENCOUNTER — Ambulatory Visit (INDEPENDENT_AMBULATORY_CARE_PROVIDER_SITE_OTHER): Payer: Medicare Other

## 2022-09-15 DIAGNOSIS — M810 Age-related osteoporosis without current pathological fracture: Secondary | ICD-10-CM

## 2022-09-15 MED ORDER — DENOSUMAB 60 MG/ML ~~LOC~~ SOSY
60.0000 mg | PREFILLED_SYRINGE | Freq: Once | SUBCUTANEOUS | Status: AC
  Administered 2022-09-15: 60 mg via SUBCUTANEOUS

## 2022-09-15 NOTE — Progress Notes (Signed)
Per orders of Allie Bossier NP, injection of Prolia 60 mg in right arm given by Ozzie Hoyle. Patient tolerated injection well.

## 2022-10-18 ENCOUNTER — Ambulatory Visit (INDEPENDENT_AMBULATORY_CARE_PROVIDER_SITE_OTHER): Payer: Medicare Other

## 2022-10-18 VITALS — Ht 64.0 in | Wt 125.0 lb

## 2022-10-18 DIAGNOSIS — Z0000 Encounter for general adult medical examination without abnormal findings: Secondary | ICD-10-CM | POA: Diagnosis not present

## 2022-10-18 DIAGNOSIS — Z780 Asymptomatic menopausal state: Secondary | ICD-10-CM | POA: Diagnosis not present

## 2022-10-18 NOTE — Patient Instructions (Signed)
Christy [name] , Thank you for taking time to come for your Medicare Wellness Visit. I appreciate your ongoing commitment to your health goals. Please review the following plan we discussed and let me know if I can assist you in the future.   These are the goals we discussed:  Goals      Increase physical activity     Starting 06/22/2018, I will continue to walk at least 1 mile daily.      Patient Stated     07/03/2019, I will maintain and continue medications as prescribed.      Patient Stated     07/24/2020, I will continue to walk everyday for 20-30 minutes.     Patient Stated     Would like to maintain current routine      Patient Stated     Maintain current level of function.        This is a list of the screening recommended for you and due dates:  Health Maintenance  Topic Date Due   Flu Shot  03/22/2022   COVID-19 Vaccine (6 - 2023-24 season) 04/22/2022   Medicare Annual Wellness Visit  10/19/2023   DTaP/Tdap/Td vaccine (5 - Td or Tdap) 07/27/2027   Pneumonia Vaccine  Completed   DEXA scan (bone density measurement)  Completed   Hepatitis C Screening: USPSTF Recommendation to screen - Ages 20-79 yo.  Completed   Zoster (Shingles) Vaccine  Completed   HPV Vaccine  Aged Out   Colon Cancer Screening  Discontinued    Advanced directives: Copy on chart.  Conditions/risks identified: none  Next appointment: Follow up in one year for your annual wellness visit 10/25/2023 @ 10:00 via telephone.   Preventive Care 41 Years and Older, Female Preventive care refers to lifestyle choices and visits with your health care provider that can promote health and wellness. What does preventive care include? A yearly physical exam. This is also called an annual well check. Dental exams once or twice a year. Routine eye exams. Ask your health care provider how often you should have your eyes checked. Personal lifestyle choices, including: Daily care of your teeth and gums. Regular  physical activity. Eating a healthy diet. Avoiding tobacco and drug use. Limiting alcohol use. Practicing safe sex. Taking low-dose aspirin every day. Taking vitamin and mineral supplements as recommended by your health care provider. What happens during an annual well check? The services and screenings done by your health care provider during your annual well check will depend on your age, overall health, lifestyle risk factors, and family history of disease. Counseling  Your health care provider may ask you questions about your: Alcohol use. Tobacco use. Drug use. Emotional well-being. Home and relationship well-being. Sexual activity. Eating habits. History of falls. Memory and ability to understand (cognition). Work and work Statistician. Reproductive health. Screening  You may have the following tests or measurements: Height, weight, and BMI. Blood pressure. Lipid and cholesterol levels. These may be checked every 5 years, or more frequently if you are over 45 years old. Skin check. Lung cancer screening. You may have this screening every year starting at age 3 if you have a 30-pack-year history of smoking and currently smoke or have quit within the past 15 years. Fecal occult blood test (FOBT) of the stool. You may have this test every year starting at age 28. Flexible sigmoidoscopy or colonoscopy. You may have a sigmoidoscopy every 5 years or a colonoscopy every 10 years starting at age 80. Hepatitis  C blood test. Hepatitis B blood test. Sexually transmitted disease (STD) testing. Diabetes screening. This is done by checking your blood sugar (glucose) after you have not eaten for a while (fasting). You may have this done every 1-3 years. Bone density scan. This is done to screen for osteoporosis. You may have this done starting at age 55. Mammogram. This may be done every 1-2 years. Talk to your health care provider about how often you should have regular mammograms. Talk  with your health care provider about your test results, treatment options, and if necessary, the need for more tests. Vaccines  Your health care provider may recommend certain vaccines, such as: Influenza vaccine. This is recommended every year. Tetanus, diphtheria, and acellular pertussis (Tdap, Td) vaccine. You may need a Td booster every 10 years. Zoster vaccine. You may need this after age 107. Pneumococcal 13-valent conjugate (PCV13) vaccine. One dose is recommended after age 53. Pneumococcal polysaccharide (PPSV23) vaccine. One dose is recommended after age 28. Talk to your health care provider about which screenings and vaccines you need and how often you need them. This information is not intended to replace advice given to you by your health care provider. Make sure you discuss any questions you have with your health care provider. Document Released: 09/04/2015 Document Revised: 04/27/2016 Document Reviewed: 06/09/2015 Elsevier Interactive Patient Education  2017 [place] Prevention in the Home Falls can cause injuries. They can happen to people of all ages. There are many things you can do to make your home safe and to help prevent falls. What can I do on the outside of my home? Regularly fix the edges of walkways and driveways and fix any cracks. Remove anything that might make you trip as you walk through a door, such as a raised step or threshold. Trim any bushes or trees on the path to your home. Use bright outdoor lighting. Clear any walking paths of anything that might make someone trip, such as rocks or tools. Regularly check to see if handrails are loose or broken. Make sure that both sides of any steps have handrails. Any raised decks and porches should have guardrails on the edges. Have any leaves, snow, or ice cleared regularly. Use sand or salt on walking paths during winter. Clean up any spills in your garage right away. This includes oil or grease  spills. What can I do in the bathroom? Use night lights. Install grab bars by the toilet and in the tub and shower. Do not use towel bars as grab bars. Use non-skid mats or decals in the tub or shower. If you need to sit down in the shower, use a plastic, non-slip stool. Keep the floor dry. Clean up any water that spills on the floor as soon as it happens. Remove soap buildup in the tub or shower regularly. Attach bath mats securely with double-sided non-slip rug tape. Do not have throw rugs and other things on the floor that can make you trip. What can I do in the bedroom? Use night lights. Make sure that you have a light by your bed that is easy to reach. Do not use any sheets or blankets that are too big for your bed. They should not hang down onto the floor. Have a firm chair that has side arms. You can use this for support while you get dressed. Do not have throw rugs and other things on the floor that can make you trip. What can I do in the  kitchen? Clean up any spills right away. Avoid walking on wet floors. Keep items that you use a lot in easy-to-reach places. If you need to reach something above you, use a strong step stool that has a grab bar. Keep electrical cords out of the way. Do not use floor polish or wax that makes floors slippery. If you must use wax, use non-skid floor wax. Do not have throw rugs and other things on the floor that can make you trip. What can I do with my stairs? Do not leave any items on the stairs. Make sure that there are handrails on both sides of the stairs and use them. Fix handrails that are broken or loose. Make sure that handrails are as long as the stairways. Check any carpeting to make sure that it is firmly attached to the stairs. Fix any carpet that is loose or worn. Avoid having throw rugs at the top or bottom of the stairs. If you do have throw rugs, attach them to the floor with carpet tape. Make sure that you have a light switch at the  top of the stairs and the bottom of the stairs. If you do not have them, ask someone to add them for you. What else can I do to help prevent falls? Wear shoes that: Do not have high heels. Have rubber bottoms. Are comfortable and fit you well. Are closed at the toe. Do not wear sandals. If you use a stepladder: Make sure that it is fully opened. Do not climb a closed stepladder. Make sure that both sides of the stepladder are locked into place. Ask someone to hold it for you, if possible. Clearly mark and make sure that you can see: Any grab bars or handrails. First and last steps. Where the edge of each step is. Use tools that help you move around (mobility aids) if they are needed. These include: Canes. Walkers. Scooters. Crutches. Turn on the lights when you go into a dark area. Replace any light bulbs as soon as they burn out. Set up your furniture so you have a clear path. Avoid moving your furniture around. If any of your floors are uneven, fix them. If there are any pets around you, be aware of where they are. Review your medicines with your doctor. Some medicines can make you feel dizzy. This can increase your chance of falling. Ask your doctor what other things that you can do to help prevent falls. This information is not intended to replace advice given to you by your health care provider. Make sure you discuss any questions you have with your health care provider. Document Released: 06/04/2009 Document Revised: 01/14/2016 Document Reviewed: 09/12/2014 Elsevier Interactive Patient Education  2017 [employer].

## 2022-10-18 NOTE — Progress Notes (Signed)
I connected with  Deatra Canter on 10/18/22 by a audio enabled telemedicine application and verified that I am speaking with the correct person using two identifiers.  Patient Location: Home  Provider Location: Office/Clinic  I discussed the limitations of evaluation and management by telemedicine. The patient expressed understanding and agreed to proceed.  Subjective:   Christy [name] is a 77 y.o. female who presents for Medicare Annual (Subsequent) preventive examination.  Review of Systems     Cardiac Risk Factors include: advanced age (>17mn, >>83women);dyslipidemia     Objective:    Today's Vitals   10/18/22 0857  Weight: 125 lb (56.7 kg)  Height: '5\' 4"'$  (1.626 m)   Body mass index is 21.46 kg/m.     10/18/2022    9:06 AM 10/13/2021    9:05 AM 07/24/2020    2:03 PM 07/03/2019    9:56 AM 02/12/2019    7:53 AM 06/22/2018   11:11 AM 02/12/2018    9:36 AM  Advanced Directives  Does Patient Have a Medical Advance Directive? Yes Yes Yes Yes Yes Yes Yes  Type of AParamedicof AHighland-on-the-LakeLiving will HLa MotteLiving will HGreentownLiving will HConey IslandLiving will [place]os[place]Living will;Out of facility DNR (pink MOST or yellow form) [place]Living will [place]Living will  Does patient want to make changes to medical advance directive? No - Patient declined Yes (MAU/Ambulatory/Procedural Areas - Information given)     No - Patient declined  Copy of H[place]stownin Chart? Yes - validated most recent copy scanned in chart (See row information)  Yes - validated most recent copy scanned in chart (See row information) Yes - validated most recent copy scanned in chart (See row information) Yes - validated most recent copy scanned in chart (See row information) Yes Yes    Current Medications (verified) Outpatient Encounter  Medications as of 10/18/2022  Medication Sig   atorvastatin (LIPITOR) 10 MG tablet Take 1 tablet (10 mg total) by mouth daily. for cholesterol.   brimonidine (ALPHAGAN) 0.2 % ophthalmic solution Place 1 drop into the left eye 2 (two) times daily.    CALCIUM CARBONATE-VITAMIN D PO Take 1 tablet by mouth 2 (two) times daily.    cholecalciferol (VITAMIN D) 1000 UNITS tablet Take 1,000 Units by mouth daily.   Cranberry 500 MG CAPS Take 500 mg by mouth daily.    denosumab (PROLIA) 60 MG/ML SOLN injection Inject 60 mg into the skin every 6 (six) months. Administer in upper arm, thigh, or abdomen   dorzolamide-timolol (COSOPT) 22.3-6.8 MG/ML ophthalmic solution Place 1 drop into the left eye every 12 (twelve) hours.   estradiol (ESTRACE) 0.1 MG/GM vaginal cream INSERT ONE-HALF GRAM VAGINALLY TWICE WEEKLY   GLUCOSAMINE CHONDROITIN COMPLX PO Take 1 tablet by mouth daily.    hydrocortisone 2.5 % lotion Apply topically as directed. Apply to ears 3 nights per week Monday, Wednesday, and Fridays   Multiple Vitamin (MULTIVITAMIN) tablet Take 1 tablet by mouth daily.   Omega-3 Fatty Acids (FISH OIL) 1200 MG CAPS Take 2,400 mg by mouth daily.    triamcinolone cream (KENALOG) 0.1 % Apply twice daily to affected area on back up to 2 weeks as needed for rash/itching. Avoid applying to face, groin, and axilla. (Patient not taking: Reported on 10/18/2022)   No facility-administered encounter medications on file as of 10/18/2022.    Allergies (verified) Xalatan [latanoprost]  History: Past Medical History:  Diagnosis Date   Arthritis    Atrophy of vagina    Cystocele    GERD (gastroesophageal reflux disease)    Glaucoma    left eye   Heart murmur    Rectocele    Uveitis    left   Past Surgical History:  Procedure Laterality Date   ABDOMINAL HYSTERECTOMY     CATARACT EXTRACTION, BILATERAL     COLONOSCOPY WITH PROPOFOL N/A 02/12/2019   Procedure: COLONOSCOPY WITH PROPOFOL;  Surgeon: Lucilla Lame, MD;   Location: [facility] ENDOSCOPY;  Service: Endoscopy;  Laterality: N/A;   ESOPHAGOGASTRODUODENOSCOPY (EGD) WITH PROPOFOL N/A 02/12/2019   Procedure: ESOPHAGOGASTRODUODENOSCOPY (EGD) WITH PROPOFOL;  Surgeon: Lucilla Lame, MD;  Location: [facility] ENDOSCOPY;  Service: Endoscopy;  Laterality: N/A;   EYE SURGERY Bilateral    lens implant   TUBAL LIGATION     VAGINAL HYSTERECTOMY Bilateral 02/12/2018   Procedure: HYSTERECTOMY VAGINAL WITH BILATERAL SALPINGO OOPHERECTOMY;  Surgeon: Brayton Mars, MD;  Location: [facility] ORS;  Service: Gynecology;  Laterality: Bilateral;   Family History  Problem Relation Age of Onset   Aortic stenosis Mother    Heart disease Mother    Osteoporosis Mother    Cancer Father        prostate   Heart disease Father    Multiple sclerosis Daughter    Breast cancer Other    Social History   Socioeconomic History   Marital status: Widowed    Spouse name: Not on file   Number of children: Not on file   Years of education: Not on file   Highest education level: Not on file  Occupational History   Not on file  Tobacco Use   Smoking status: Never   Smokeless tobacco: Never  Vaping Use   Vaping Use: Never used  Substance and Sexual Activity   Alcohol use: Yes    Alcohol/week: 1.0 standard drink of alcohol    Types: 1 Glasses of wine per week    Comment: wine weekly   Drug use: No   Sexual activity: Not Currently    Birth control/protection: Surgical  Other Topics Concern   Not on file  Social History Narrative   Recently moved to [facility] from [place].   She is primary caregiver for her husband who has Alzheimers.   Two children.      Retired Engineer, production.      Desires CPR.   Has living will- SCANNED INTO CHART   Would not want prolonged life support if futile.                  Social Determinants of Health   Financial Resource Strain: Low Risk  (10/18/2022)   Overall Financial Resource Strain (CARDIA)    Difficulty of Paying Living  Expenses: Not hard at all  Food Insecurity: No Food Insecurity (10/18/2022)   Hunger Vital Sign    Worried About Running Out of Food in the Last Year: Never true    Ran Out of Food in the Last Year: Never true  Transportation Needs: No Transportation Needs (10/18/2022)   PRAPARE - Hydrologist (Medical): No    Lack of Transportation (Non-Medical): No  Physical Activity: Sufficiently Active (10/18/2022)   Exercise Vital Sign    Days of Exercise per Week: 7 days    Minutes of Exercise per Session: 30 min  Stress: No Stress Concern Present (10/18/2022)   [place] -  Occupational Stress Questionnaire    Feeling of Stress : Not at all  Social Connections: Moderately Integrated (10/18/2022)   Social Connection and Isolation Panel [NHANES]    Frequency of Communication with Friends and Family: More than three times a week    Frequency of Social Gatherings with Friends and Family: Three times a week    Attends Religious Services: More than 4 times per year    Active Member of Clubs or Organizations: Yes    Attends Archivist Meetings: More than 4 times per year    Marital Status: Widowed    Tobacco Counseling Counseling given: Not Answered   Clinical Intake:  Pre-visit preparation completed: Yes  Pain : No/denies pain     Nutritional Risks: None Diabetes: No  How often do you need to have someone help you when you read instructions, pamphlets, or other written materials from your doctor or pharmacy?: 1 - Never  Diabetic? no  Interpreter Needed?: No  Information entered by :: C.Bao Bazen LPN   Activities of Daily Living    10/18/2022    9:07 AM 10/14/2022    9:31 AM  In your present state of health, do you have any difficulty performing the following activities:  Hearing? 0 0  Vision? 0 0  Difficulty concentrating or making decisions? 0 0  Walking or climbing stairs? 0 0  Dressing or bathing? 0 0  Doing  errands, shopping? 0 0  Preparing Food and eating ? N N  Using the Toilet? N N  In the past six months, have you accidently leaked urine? Y Y  Comment occasionally at night   Do you have problems with loss of bowel control? N N  Managing your Medications? N N  Managing your Finances? N N  Housekeeping or managing your Housekeeping? N N    Patient Care Team: Pleas Koch, NP as PCP - General (Internal Medicine) Leandrew Koyanagi, MD as Referring Physician (Ophthalmology) Defrancesco, Alanda Slim, MD as Consulting Physician (Obstetrics and Gynecology)  Indicate any recent Medical Services you may have received from other than [facility] providers in the past year (date may be approximate).     Assessment:   This is a routine wellness examination for Sharaine.  Hearing/Vision screen Hearing Screening - Comments:: No aids Vision Screening - Comments:: [place] - Dr.Brasington   Dietary issues and exercise activities discussed: Current Exercise Habits: Home exercise routine, Type of exercise: walking, Time (Minutes): 30, Frequency (Times/Week): 7, Weekly Exercise (Minutes/Week): 210, Intensity: Mild, Exercise limited by: None identified   Goals Addressed             This Visit's Progress    Patient Stated       Maintain current level of function.       Depression Screen    10/18/2022    9:05 AM 10/13/2021    9:11 AM 08/07/2020    9:59 AM 07/24/2020    2:05 PM 07/03/2019   10:00 AM 06/22/2018   11:10 AM 06/09/2016    9:19 AM  PHQ 2/9 Scores  PHQ - 2 Score 0 0 0 0 0 0 0  PHQ- 9 Score    0 0 0     Fall Risk    10/18/2022    9:07 AM 10/14/2022    9:31 AM 10/13/2021    9:08 AM 08/07/2020    9:59 AM 07/24/2020    2:05 PM  Fall Risk   Falls in the past year? 0 0 0 0 0  Number falls in past yr: 0 0 0  0  Injury with Fall? 0 0 0  0  Risk for fall due to : No Fall Risks  No Fall Risks  No Fall Risks  Follow up Falls prevention discussed;Falls evaluation completed  Falls  prevention discussed  Falls evaluation completed;Falls prevention discussed    FALL RISK PREVENTION PERTAINING TO THE HOME:  Any stairs in or around the home? No  If so, are there any without handrails? No  Home free of loose throw rugs in walkways, pet beds, electrical cords, etc? Yes  Adequate lighting in your home to reduce risk of falls? Yes   ASSISTIVE DEVICES UTILIZED TO PREVENT FALLS:  Life alert? No  Use of a cane, walker or w/c? No  Grab bars in the bathroom? Yes  Shower chair or bench in shower? Yes  Elevated toilet seat or a handicapped toilet? No    Cognitive Function:    07/24/2020    2:06 PM 07/03/2019   10:06 AM 06/22/2018   11:11 AM  MMSE - Mini Mental State Exam  Orientation to time '5 5 5  '$ Orientation to Place '5 5 5  '$ Registration '3 3 3  '$ Attention/ Calculation 5 5 0  Recall '3 3 3  '$ Language- name 2 objects   0  Language- repeat '1 1 1  '$ Language- follow 3 step command   3  Language- read & follow direction   0  Write a sentence   0  Copy design   0  Total score   20        10/18/2022    9:09 AM  6CIT Screen  What Year? 0 points  What month? 0 points  What time? 0 points  Count back from 20 0 points  Months in reverse 0 points  Repeat phrase 0 points  Total Score 0 points    Immunizations Immunization History  Administered Date(s) Administered   Fluad Quad(high Dose 65+) 05/21/2019, 05/02/2020   Influenza, Seasonal, Injecte, Preservative Fre 06/29/2006, 07/04/2007, 06/24/2008, 05/28/2009, 06/14/2010, 06/03/2011, 05/28/2012   Influenza,inj,Quad PF,6+ Mos 05/06/2013, 05/20/2014, 06/09/2015, 06/15/2017, 06/22/2018   Influenza-Unspecified 06/03/2021   Moderna Sars-Covid-2 Vaccination 09/05/2019, 10/03/2019, 07/07/2020, 12/30/2020   Pfizer Covid-19 Vaccine Bivalent Booster 73yr & up 05/13/2021   Pneumococcal Conjugate-13 07/10/2014   Pneumococcal Polysaccharide-23 01/05/2010   Pneumococcal-Unspecified 01/05/2010   Td 08/22/1996, 12/21/2006,  07/26/2017   Tdap 12/21/2006   Zoster Recombinat (Shingrix) 03/29/2018, 07/04/2018   Zoster, Live 02/24/2006    TDAP status: Up to date  Flu Vaccine status: Up to date 06/07/2022  Pneumococcal vaccine status: Up to date  Covid-19 vaccine status: Completed vaccines  Qualifies for Shingles Vaccine? No   Zostavax completed Yes   Shingrix Completed?: Yes  Screening Tests Health Maintenance  Topic Date Due   INFLUENZA VACCINE  03/22/2022   COVID-19 Vaccine (6 - 2023-24 season) 04/22/2022   Medicare Annual Wellness (AWV)  10/19/2023   DTaP/Tdap/Td (5 - Td or Tdap) 07/27/2027   Pneumonia Vaccine 78 Years old  Completed   DEXA SCAN  Completed   Hepatitis C Screening  Completed   Zoster Vaccines- Shingrix  Completed   HPV VACCINES  Aged Out   COLONOSCOPY (Pts 45-444yrInsurance coverage will need to be confirmed)  Discontinued    Health Maintenance  Health Maintenance Due  Topic Date Due   INFLUENZA VACCINE  03/22/2022   COVID-19 Vaccine (6 - 2023-24 season) 04/22/2022    Colorectal cancer screening: No longer required.  Mammogram status: Completed 08/30/2022. Repeat every year  Bone Density status: Completed 09/10/2020. Results reflect: Bone density results: OSTEOPOROSIS. Repeat every 2 years. Order placed today.  Lung Cancer Screening: (Low Dose CT Chest recommended if Age 53-80 years, 30 pack-year currently smoking OR have quit w/in 15years.) does not qualify.   Lung Cancer Screening Referral: no  Additional Screening:  Hepatitis C Screening: does not qualify; Completed 06/09/2016  Vision Screening: Recommended annual ophthalmology exams for early detection of glaucoma and other disorders of the eye. Is the patient up to date with their annual eye exam?  Yes  Who is the provider or what is the name of the office in which the patient attends annual eye exams? Dr.Brasington If pt is not established with a provider, would they like to be referred to a provider to  establish care? No .   Dental Screening: Recommended annual dental exams for proper oral hygiene  Community Resource Referral / Chronic Care Management: CRR required this visit?  No   CCM required this visit?  No      Plan:     I have personally reviewed and noted the following in the patient's chart:   Medical and social history Use of alcohol, tobacco or illicit drugs  Current medications and supplements including opioid prescriptions. Patient is not currently taking opioid prescriptions. Functional ability and status Nutritional status Physical activity Advanced directives List of other physicians Hospitalizations, surgeries, and ER visits in previous 12 months Vitals Screenings to include cognitive, depression, and falls Referrals and appointments  In addition, I have reviewed and discussed with patient certain preventive protocols, quality metrics, and best practice recommendations. A written personalized care plan for preventive services as well as general preventive health recommendations were provided to patient.     Lebron Conners, LPN   [id number]   Nurse Notes: order placed for Bone Scan.

## 2022-10-27 DIAGNOSIS — H401122 Primary open-angle glaucoma, left eye, moderate stage: Secondary | ICD-10-CM | POA: Diagnosis not present

## 2022-11-02 ENCOUNTER — Encounter: Payer: Medicare Other | Admitting: Primary Care

## 2022-11-09 ENCOUNTER — Ambulatory Visit: Payer: Medicare Other | Attending: Primary Care

## 2022-11-09 ENCOUNTER — Encounter: Payer: Self-pay | Admitting: Primary Care

## 2022-11-09 ENCOUNTER — Ambulatory Visit (INDEPENDENT_AMBULATORY_CARE_PROVIDER_SITE_OTHER): Payer: Medicare Other | Admitting: Primary Care

## 2022-11-09 VITALS — BP 144/84 | HR 88 | Temp 97.9°F | Ht 64.0 in | Wt 124.0 lb

## 2022-11-09 DIAGNOSIS — M810 Age-related osteoporosis without current pathological fracture: Secondary | ICD-10-CM | POA: Diagnosis not present

## 2022-11-09 DIAGNOSIS — M25551 Pain in right hip: Secondary | ICD-10-CM

## 2022-11-09 DIAGNOSIS — H4089 Other specified glaucoma: Secondary | ICD-10-CM | POA: Diagnosis not present

## 2022-11-09 DIAGNOSIS — R5383 Other fatigue: Secondary | ICD-10-CM | POA: Insufficient documentation

## 2022-11-09 DIAGNOSIS — R002 Palpitations: Secondary | ICD-10-CM

## 2022-11-09 DIAGNOSIS — H209 Unspecified iridocyclitis: Secondary | ICD-10-CM | POA: Diagnosis not present

## 2022-11-09 DIAGNOSIS — N952 Postmenopausal atrophic vaginitis: Secondary | ICD-10-CM | POA: Diagnosis not present

## 2022-11-09 DIAGNOSIS — K219 Gastro-esophageal reflux disease without esophagitis: Secondary | ICD-10-CM

## 2022-11-09 DIAGNOSIS — E785 Hyperlipidemia, unspecified: Secondary | ICD-10-CM | POA: Diagnosis not present

## 2022-11-09 DIAGNOSIS — R7303 Prediabetes: Secondary | ICD-10-CM | POA: Diagnosis not present

## 2022-11-09 DIAGNOSIS — G8929 Other chronic pain: Secondary | ICD-10-CM

## 2022-11-09 LAB — COMPREHENSIVE METABOLIC PANEL
ALT: 25 U/L (ref 0–35)
AST: 26 U/L (ref 0–37)
Albumin: 4.4 g/dL (ref 3.5–5.2)
Alkaline Phosphatase: 36 U/L — ABNORMAL LOW (ref 39–117)
BUN: 13 mg/dL (ref 6–23)
CO2: 30 mEq/L (ref 19–32)
Calcium: 9.4 mg/dL (ref 8.4–10.5)
Chloride: 101 mEq/L (ref 96–112)
Creatinine, Ser: 0.61 mg/dL (ref 0.40–1.20)
GFR: 85.9 mL/min (ref >60.00)
Glucose, Bld: 91 mg/dL (ref 70–99)
Potassium: 4 mEq/L (ref 3.5–5.1)
Sodium: 139 mEq/L (ref 135–145)
Total Bilirubin: 0.8 mg/dL (ref 0.2–1.2)
Total Protein: 6.6 g/dL (ref 6.0–8.3)

## 2022-11-09 LAB — CBC
HCT: 43.9 % (ref 36.0–46.0)
Hemoglobin: 14.8 g/dL (ref 12.0–15.0)
MCHC: 33.7 g/dL (ref 30.0–36.0)
MCV: 97.2 fl (ref 78.0–100.0)
Platelets: 199 10*3/uL (ref 150.0–400.0)
RBC: 4.52 Mil/uL (ref 3.87–5.11)
RDW: 13.6 % (ref 11.5–15.5)
WBC: 5.1 10*3/uL (ref 4.0–10.5)

## 2022-11-09 LAB — LIPID PANEL
Cholesterol: 181 mg/dL (ref 0–200)
HDL: 84.4 mg/dL (ref >39.00)
LDL Cholesterol: 84 mg/dL (ref 0–99)
NonHDL: 96.5
Total CHOL/HDL Ratio: 2
Triglycerides: 63 mg/dL (ref 0.0–149.0)
VLDL: 12.6 mg/dL (ref 0.0–40.0)

## 2022-11-09 LAB — HEMOGLOBIN A1C: Hgb A1c MFr Bld: 5.9 % (ref 4.6–6.5)

## 2022-11-09 LAB — TSH: TSH: 0.89 u[IU]/mL (ref 0.35–5.50)

## 2022-11-09 NOTE — Assessment & Plan Note (Signed)
Following with Ophthalmology. Continue Cospot and Alphagan drops.

## 2022-11-09 NOTE — Assessment & Plan Note (Addendum)
Checking labs today.  ECG today with NSR, rate of 68. PCV noted. No PAC or atrial fibrillation.  No PVC noted in ECG from 2019, otherwise appears similar.   PVC could be the symptom she is noticing on her home BP monitor, but given her age we will rule out paroxysmal atrial fibrillation.  Will order Zio Patch monitor to evaluate for paroxysmal atrial fibrillation. Discussed with patient today.

## 2022-11-09 NOTE — Assessment & Plan Note (Signed)
Improved from Bone Density scan from 2022. Repeat bone density scan in April 2024.  Continue Prolia 60 mg semi-annually.

## 2022-11-09 NOTE — Assessment & Plan Note (Signed)
Stable. ? ?No concerns today. ?Continue to monitor. ? ?

## 2022-11-09 NOTE — Assessment & Plan Note (Signed)
Following with ophthalmology.  Continue Cosopt drops and Alphagan drops.

## 2022-11-09 NOTE — Assessment & Plan Note (Addendum)
Intermittent.   Continue OTC treatment PRN.

## 2022-11-09 NOTE — Assessment & Plan Note (Signed)
Controlled.   Continue Estrace cream twice weekly.  No longer following with GYN.

## 2022-11-09 NOTE — Progress Notes (Signed)
Subjective:    Patient ID: Christy [name], female    DOB: 10-22-1945, 77 y.o.   MRN: [id number]  HPI  Christy [name] is a very pleasant 77 y.o. female with a history of GERD, osteoporosis, glaucoma, prediabetes, hyperlipidemia who presents today for follow up of chronic conditions.  1) Osteoporosis: Currently managed on Prolia 60 mg injections semi-annually, calcium and vitamin D daily. Last Prolia injection was in January of 2022 which showed improvement compared to prior years. She is scheduled for another bone density scan next month.  2) Hyperlipidemia: Currently managed on atorvastatin 10 mg daily. Due for repeat lipid panel today.   3) Glaucoma/Uveitis: Following with ophthalmology and is managed on brimonidine 0.2% drops and Cosopt 22.3-6.8 drops.   Immunizations: -Tetanus: Completed in 2018 -Influenza: Completed this season -Shingles: Completed Shingrix series -Pneumonia: Completed Prevnar 13 in 2015 and pneumovax 23 in 2011  Mammogram: January 2024 Bone Density Scan: January 2022, scheduled for April 2024  Colonoscopy: Completed in 2020, no further imaging needed given age.  BP Readings from Last 3 Encounters:  11/09/22 (!) 144/84  12/07/21 133/83  10/27/21 136/72   She is checking BP at home which is running 130's/80's. While checking her BP at home her BP meter shows an "arrhythmia" symbol. She does experience fatigue during these symptoms with HR in the 60's. Symptoms began about 1 year ago and have occurred every 1-2 months, typically in the morning.      Review of Systems  Cardiovascular:  Negative for chest pain.  Gastrointestinal:  Negative for constipation and diarrhea.  Neurological:  Negative for dizziness and headaches.         Past Medical History:  Diagnosis Date   Arthritis    Atrophy of vagina    Cystocele    GERD (gastroesophageal reflux disease)    Glaucoma    left eye   Heart murmur    Rectocele    Uveitis    left    Social  History   Socioeconomic History   Marital status: Widowed    Spouse name: Not on file   Number of children: Not on file   Years of education: Not on file   Highest education level: Not on file  Occupational History   Not on file  Tobacco Use   Smoking status: Never   Smokeless tobacco: Never  Vaping Use   Vaping Use: Never used  Substance and Sexual Activity   Alcohol use: Yes    Alcohol/week: 1.0 standard drink of alcohol    Types: 1 Glasses of wine per week    Comment: wine weekly   Drug use: No   Sexual activity: Not Currently    Birth control/protection: Surgical  Other Topics Concern   Not on file  Social History Narrative   Recently moved to [facility] from [place].   She is primary caregiver for her husband who has Alzheimers.   Two children.      Retired Engineer, production.      Desires CPR.   Has living will- SCANNED INTO CHART   Would not want prolonged life support if futile.                  Social Determinants of Health   Financial Resource Strain: Low Risk  (10/18/2022)   Overall Financial Resource Strain (CARDIA)    Difficulty of Paying Living Expenses: Not hard at all  Food Insecurity: No Food Insecurity (10/18/2022)   Hunger Vital  Sign    Worried About Charity fundraiser in the Last Year: Never true    [place] in the Last Year: Never true  Transportation Needs: No Transportation Needs (10/18/2022)   PRAPARE - Hydrologist (Medical): No    Lack of Transportation (Non-Medical): No  Physical Activity: Sufficiently Active (10/18/2022)   Exercise Vital Sign    Days of Exercise per Week: 7 days    Minutes of Exercise per Session: 30 min  Stress: No Stress Concern Present (10/18/2022)   [place]    Feeling of Stress : Not at all  Social Connections: Moderately Integrated (10/18/2022)   Social Connection and Isolation Panel [NHANES]     Frequency of Communication with Friends and Family: More than three times a week    Frequency of Social Gatherings with Friends and Family: Three times a week    Attends Religious Services: More than 4 times per year    Active Member of Clubs or Organizations: Yes    Attends Archivist Meetings: More than 4 times per year    Marital Status: Widowed  Intimate Partner Violence: Not At Risk (10/18/2022)   Humiliation, Afraid, Rape, and Kick questionnaire    Fear of Current or Ex-Partner: No    Emotionally Abused: No    Physically Abused: No    Sexually Abused: No    Past Surgical History:  Procedure Laterality Date   ABDOMINAL HYSTERECTOMY     CATARACT EXTRACTION, BILATERAL     COLONOSCOPY WITH PROPOFOL N/A 02/12/2019   Procedure: COLONOSCOPY WITH PROPOFOL;  Surgeon: Lucilla Lame, MD;  Location: [facility] ENDOSCOPY;  Service: Endoscopy;  Laterality: N/A;   ESOPHAGOGASTRODUODENOSCOPY (EGD) WITH PROPOFOL N/A 02/12/2019   Procedure: ESOPHAGOGASTRODUODENOSCOPY (EGD) WITH PROPOFOL;  Surgeon: Lucilla Lame, MD;  Location: [facility] ENDOSCOPY;  Service: Endoscopy;  Laterality: N/A;   EYE SURGERY Bilateral    lens implant   TUBAL LIGATION     VAGINAL HYSTERECTOMY Bilateral 02/12/2018   Procedure: HYSTERECTOMY VAGINAL WITH BILATERAL SALPINGO OOPHERECTOMY;  Surgeon: Brayton Mars, MD;  Location: [facility] ORS;  Service: Gynecology;  Laterality: Bilateral;    Family History  Problem Relation Age of Onset   Aortic stenosis Mother    Heart disease Mother    Osteoporosis Mother    Cancer Father        prostate   Heart disease Father    Multiple sclerosis Daughter    Breast cancer Other     Allergies  Allergen Reactions   Xalatan [Latanoprost] Itching, Swelling and Other (See Comments)    Caused eye to be red, swollen,itchy    Current Outpatient Medications on File Prior to Visit  Medication Sig Dispense Refill   atorvastatin (LIPITOR) 10 MG tablet Take 1 tablet (10 mg total) by mouth  daily. for cholesterol. 90 tablet 3   brimonidine (ALPHAGAN) 0.2 % ophthalmic solution Place 1 drop into the left eye 2 (two) times daily.      CALCIUM CARBONATE-VITAMIN D PO Take 1 tablet by mouth 2 (two) times daily.      cholecalciferol (VITAMIN D) 1000 UNITS tablet Take 1,000 Units by mouth daily.     Cranberry 500 MG CAPS Take 500 mg by mouth daily.      denosumab (PROLIA) 60 MG/ML SOLN injection Inject 60 mg into the skin every 6 (six) months. Administer in upper arm, thigh, or abdomen     dorzolamide-timolol (COSOPT) 22.3-6.8  MG/ML ophthalmic solution Place 1 drop into the left eye every 12 (twelve) hours.     estradiol (ESTRACE) 0.1 MG/GM vaginal cream INSERT ONE-HALF GRAM VAGINALLY TWICE WEEKLY 43 g 0   GLUCOSAMINE CHONDROITIN COMPLX PO Take 1 tablet by mouth daily.      hydrocortisone 2.5 % lotion Apply topically as directed. Apply to ears 3 nights per week Monday, Wednesday, and Fridays 59 mL 6   Multiple Vitamin (MULTIVITAMIN) tablet Take 1 tablet by mouth daily.     Omega-3 Fatty Acids (FISH OIL) 1200 MG CAPS Take 2,400 mg by mouth daily.      triamcinolone cream (KENALOG) 0.1 % Apply twice daily to affected area on back up to 2 weeks as needed for rash/itching. Avoid applying to face, groin, and axilla. 30 g 2   No current facility-administered medications on file prior to visit.    BP (!) 144/84   Pulse 88   Temp 97.9 F (36.6 C) (Temporal)   Ht 5\' 4"  (1.626 m)   Wt 124 lb (56.2 kg)   SpO2 98%   BMI 21.28 kg/m  Objective:   Physical Exam Cardiovascular:     Rate and Rhythm: Normal rate and regular rhythm.  Pulmonary:     Effort: Pulmonary effort is normal.     Breath sounds: Normal breath sounds.  Abdominal:     Palpations: Abdomen is soft.     Tenderness: There is no abdominal tenderness.  Musculoskeletal:     Cervical back: Neck supple.  Skin:    General: Skin is warm and dry.  Neurological:     Mental Status: She is alert and oriented to person, place, and  time.  Psychiatric:        Mood and Affect: Mood normal.           Assessment & Plan:  Gastroesophageal reflux disease, unspecified whether esophagitis present Assessment & Plan: Intermittent.   Continue OTC treatment PRN.    Osteoporosis, unspecified osteoporosis type, unspecified pathological fracture presence Assessment & Plan: Improved from Bone Density scan from 2022. Repeat bone density scan in April 2024.  Continue Prolia 60 mg semi-annually.   Atrophy of vagina Assessment & Plan: Controlled.   Continue Estrace cream twice weekly.  No longer following with GYN.    Other glaucoma of right eye Assessment & Plan: Following with ophthalmology.  Continue Cosopt drops and Alphagan drops.   Hyperlipidemia, unspecified hyperlipidemia type Assessment & Plan: Repeat lipid panel pending. Continue atorvastatin 10 mg daily.  Orders: -     Lipid panel -     Comprehensive metabolic panel  Prediabetes Assessment & Plan: Repeat A1C pending.  Discussed the importance of a healthy diet and regular exercise in order for weight loss, and to reduce the risk of further co-morbidity.   Orders: -     Hemoglobin A1c  Uveitis Assessment & Plan: Following with Ophthalmology. Continue Cospot and Alphagan drops.   Chronic pain of right hip Assessment & Plan: Stable. No concerns today.  Continue to monitor.    Other fatigue Assessment & Plan: Checking labs today.  ECG today with NSR, rate of 68. PCV noted. No PAC or atrial fibrillation.  No PVC noted in ECG from 2019, otherwise appears similar.   PVC could be the symptom she is noticing on her home BP monitor, but given her age we will rule out paroxysmal atrial fibrillation.  Will order Zio Patch monitor to evaluate for paroxysmal atrial fibrillation. Discussed with patient today.  Orders: -     EKG 12-Lead -     CBC -     TSH  Palpitations -     LONG TERM MONITOR (3-14 DAYS);  Future        Pleas Koch, NP

## 2022-11-09 NOTE — Assessment & Plan Note (Signed)
Repeat A1C pending.   Discussed the importance of a healthy diet and regular exercise in order for weight loss, and to reduce the risk of further co-morbidity.  

## 2022-11-09 NOTE — Patient Instructions (Signed)
Stop by the lab prior to leaving today. I will notify you of your results once received.   Complete the Zio Monitor Kit for evaluation of an irregular heartbeat.   Complete your bone density scan.   It was a pleasure to see you today!

## 2022-11-09 NOTE — Assessment & Plan Note (Signed)
Repeat lipid panel pending.  Continue atorvastatin 10 mg daily. 

## 2022-11-12 DIAGNOSIS — R002 Palpitations: Secondary | ICD-10-CM | POA: Diagnosis not present

## 2022-11-16 ENCOUNTER — Ambulatory Visit: Payer: Medicare Other | Admitting: Dermatology

## 2022-11-21 ENCOUNTER — Other Ambulatory Visit: Payer: Self-pay | Admitting: Primary Care

## 2022-11-21 DIAGNOSIS — E785 Hyperlipidemia, unspecified: Secondary | ICD-10-CM

## 2022-11-29 DIAGNOSIS — Z23 Encounter for immunization: Secondary | ICD-10-CM | POA: Diagnosis not present

## 2022-11-30 ENCOUNTER — Ambulatory Visit (INDEPENDENT_AMBULATORY_CARE_PROVIDER_SITE_OTHER): Payer: Medicare Other | Admitting: Dermatology

## 2022-11-30 VITALS — BP 131/81

## 2022-11-30 DIAGNOSIS — L578 Other skin changes due to chronic exposure to nonionizing radiation: Secondary | ICD-10-CM

## 2022-11-30 DIAGNOSIS — Z1283 Encounter for screening for malignant neoplasm of skin: Secondary | ICD-10-CM

## 2022-11-30 DIAGNOSIS — L821 Other seborrheic keratosis: Secondary | ICD-10-CM | POA: Diagnosis not present

## 2022-11-30 DIAGNOSIS — D229 Melanocytic nevi, unspecified: Secondary | ICD-10-CM | POA: Diagnosis not present

## 2022-11-30 DIAGNOSIS — Z808 Family history of malignant neoplasm of other organs or systems: Secondary | ICD-10-CM | POA: Diagnosis not present

## 2022-11-30 DIAGNOSIS — L814 Other melanin hyperpigmentation: Secondary | ICD-10-CM | POA: Diagnosis not present

## 2022-11-30 NOTE — Patient Instructions (Signed)
Due to recent changes in healthcare laws, you may see results of your pathology and/or laboratory studies on MyChart before the doctors have had a chance to review them. We understand that in some cases there may be results that are confusing or concerning to you. Please understand that not all results are received at the same time and often the doctors may need to interpret multiple results in order to provide you with the best plan of care or course of treatment. Therefore, we ask that you please give us 2 business days to thoroughly review all your results before contacting the office for clarification. Should we see a critical lab result, you will be contacted sooner.   If You Need Anything After Your Visit  If you have any questions or concerns for your doctor, please call our main line at [phone] and press option 4 to reach your doctor's medical assistant. If no one answers, please leave a voicemail as directed and we will return your call as soon as possible. Messages left after 4 pm will be answered the following business day.   You may also send us a message via MyChart. We typically respond to MyChart messages within 1-2 business days.  For prescription refills, please ask your pharmacy to contact our office. Our fax number is [phone].  If you have an urgent issue when the clinic is closed that cannot wait until the next business day, you can page your doctor at the number below.    Please note that while we do our best to be available for urgent issues outside of office hours, we are not available 24/7.   If you have an urgent issue and are unable to reach us, you may choose to seek medical care at your doctor's office, retail clinic, urgent care center, or emergency room.  If you have a medical emergency, please immediately call 911 or go to the emergency department.  Pager Numbers  - Dr. Kowalski: [phone]  - Dr. Moye: [phone]  - Dr. Stewart:  [phone]  In the event of inclement weather, please call our main line at [phone] for an update on the status of any delays or closures.  Dermatology Medication Tips: Please keep the boxes that topical medications come in in order to help keep track of the instructions about where and how to use these. Pharmacies typically print the medication instructions only on the boxes and not directly on the medication tubes.   If your medication is too expensive, please contact our office at [phone] option 4 or send us a message through MyChart.   We are unable to tell what your co-pay for medications will be in advance as this is different depending on your insurance coverage. However, we may be able to find a substitute medication at lower cost or fill out paperwork to get insurance to cover a needed medication.   If a prior authorization is required to get your medication covered by your insurance company, please allow us 1-2 business days to complete this process.  Drug prices often vary depending on where the prescription is filled and some pharmacies may offer cheaper prices.  The website www.goodrx.com contains coupons for medications through different pharmacies. The prices here do not account for what the cost may be with help from insurance (it may be cheaper with your insurance), but the website can give you the price if you did not use any insurance.  - You can print the associated coupon and take it with   your prescription to the pharmacy.  - You may also stop by our office during regular business hours and pick up a GoodRx coupon card.  - If you need your prescription sent electronically to a different pharmacy, notify our office through [facility] MyChart or by phone at [phone] option 4.     Si Usted Necesita Algo Despus de Su Visita  Tambin puede enviarnos un mensaje a travs de MyChart. Por lo general respondemos a los mensajes de MyChart en el transcurso de 1 a 2  das hbiles.  Para renovar recetas, por favor pida a su farmacia que se ponga en contacto con nuestra oficina. Nuestro nmero de fax es el [phone].  Si tiene un asunto urgente cuando la clnica est cerrada y que no puede esperar hasta el siguiente da hbil, puede llamar/localizar a su doctor(a) al nmero que aparece a continuacin.   Por favor, tenga en cuenta que aunque hacemos todo lo posible para estar disponibles para asuntos urgentes fuera del horario de oficina, no estamos disponibles las 24 horas del da, los 7 das de la semana.   Si tiene un problema urgente y no puede comunicarse con nosotros, puede optar por buscar atencin mdica  en el consultorio de su doctor(a), en una clnica privada, en un centro de atencin urgente o en una sala de emergencias.  Si tiene una emergencia mdica, por favor llame inmediatamente al 911 o vaya a la sala de emergencias.  Nmeros de bper  - Dr. Kowalski: [phone]  - Dra. Moye: [phone]  - Dra. Stewart: [phone]  En caso de inclemencias del tiempo, por favor llame a nuestra lnea principal al [phone] para una actualizacin sobre el estado de cualquier retraso o cierre.  Consejos para la medicacin en dermatologa: Por favor, guarde las cajas en las que vienen los medicamentos de uso tpico para ayudarle a seguir las instrucciones sobre dnde y cmo usarlos. Las farmacias generalmente imprimen las instrucciones del medicamento slo en las cajas y no directamente en los tubos del medicamento.   Si su medicamento es muy caro, por favor, pngase en contacto con nuestra oficina llamando al [phone] y presione la opcin 4 o envenos un mensaje a travs de MyChart.   No podemos decirle cul ser su copago por los medicamentos por adelantado ya que esto es diferente dependiendo de la cobertura de su seguro. Sin embargo, es posible que podamos encontrar un medicamento sustituto a menor costo o llenar un formulario para que el  seguro cubra el medicamento que se considera necesario.   Si se requiere una autorizacin previa para que su compaa de seguros cubra su medicamento, por favor permtanos de 1 a 2 das hbiles para completar este proceso.  Los precios de los medicamentos varan con frecuencia dependiendo del lugar de dnde se surte la receta y alguna farmacias pueden ofrecer precios ms baratos.  El sitio web www.goodrx.com tiene cupones para medicamentos de diferentes farmacias. Los precios aqu no tienen en cuenta lo que podra costar con la ayuda del seguro (puede ser ms barato con su seguro), pero el sitio web puede darle el precio si no utiliz ningn seguro.  - Puede imprimir el cupn correspondiente y llevarlo con su receta a la farmacia.  - Tambin puede pasar por nuestra oficina durante el horario de atencin regular y recoger una tarjeta de cupones de GoodRx.  - Si necesita que su receta se enve electrnicamente a una farmacia diferente, informe a nuestra oficina a travs de MyChart de [facility]   o por telfono llamando al [phone] y presione la opcin 4.  

## 2022-11-30 NOTE — Progress Notes (Signed)
   Follow-Up Visit   Subjective  Christy [name] is a 77 y.o. female who presents for the following: Skin Cancer Screening and Full Body Skin Exam - No history of skin cancer or abnormal moles  The patient presents for Total-Body Skin Exam (TBSE) for skin cancer screening and mole check. The patient has spots, moles and lesions to be evaluated, some may be new or changing and the patient has concerns that these could be cancer.    The following portions of the chart were reviewed this encounter and updated as appropriate: medications, allergies, medical history  Review of Systems:  No other skin or systemic complaints except as noted in HPI or Assessment and Plan.  Objective  Well appearing patient in no apparent distress; mood and affect are within normal limits.  A full examination was performed including scalp, head, eyes, ears, nose, lips, neck, chest, axillae, abdomen, back, buttocks, bilateral upper extremities, bilateral lower extremities, hands, feet, fingers, toes, fingernails, and toenails. All findings within normal limits unless otherwise noted below.   Relevant physical exam findings are noted in the Assessment and Plan.    Assessment & Plan   FAMILY HISTORY OF SKIN CANCER What type(s):Melanoma Who affected:Sister (diagnosed ~age 72)  LENTIGINES, SEBORRHEIC KERATOSES, HEMANGIOMAS - Benign normal skin lesions - Benign-appearing - Call for any changes  MELANOCYTIC NEVI - Tan-brown and/or pink-flesh-colored symmetric macules and papules - Benign appearing on exam today - Observation - Call clinic for new or changing moles - Recommend daily use of broad spectrum spf 30+ sunscreen to sun-exposed areas.   ACTINIC DAMAGE - Chronic condition, secondary to cumulative UV/sun exposure - diffuse scaly erythematous macules with underlying dyspigmentation - Recommend daily broad spectrum sunscreen SPF 30+ to sun-exposed areas, reapply every 2 hours as needed.  - Staying in  the shade or wearing long sleeves, sun glasses (UVA+UVB protection) and wide brim hats (4-inch brim around the entire circumference of the hat) are also recommended for sun protection.  - Call for new or changing lesions.  SKIN CANCER SCREENING PERFORMED TODAY.     Return for 1-2 years , TBSE.  I, Joanie Coddington, CMA, am acting as scribe for Armida Sans, MD .   Documentation: I have reviewed the above documentation for accuracy and completeness, and I agree with the above.  Armida Sans, MD

## 2022-12-01 DIAGNOSIS — R002 Palpitations: Secondary | ICD-10-CM | POA: Diagnosis not present

## 2022-12-07 ENCOUNTER — Ambulatory Visit
Admission: RE | Admit: 2022-12-07 | Discharge: 2022-12-07 | Disposition: A | Discharge status: Home / Self Care | Payer: Medicare Other | Source: Ambulatory Visit | Attending: Primary Care | Admitting: Primary Care

## 2022-12-07 DIAGNOSIS — Z780 Asymptomatic menopausal state: Secondary | ICD-10-CM | POA: Insufficient documentation

## 2022-12-07 DIAGNOSIS — M810 Age-related osteoporosis without current pathological fracture: Secondary | ICD-10-CM | POA: Diagnosis not present

## 2022-12-14 ENCOUNTER — Encounter: Payer: Medicare Other | Admitting: Obstetrics and Gynecology

## 2022-12-18 ENCOUNTER — Encounter: Payer: Self-pay | Admitting: Dermatology

## 2022-12-22 ENCOUNTER — Ambulatory Visit: Payer: Medicare Other | Attending: Cardiovascular Disease | Admitting: Cardiovascular Disease

## 2022-12-22 ENCOUNTER — Encounter: Payer: Self-pay | Admitting: Cardiovascular Disease

## 2022-12-22 VITALS — BP 176/88 | HR 89 | Ht 64.0 in | Wt 123.4 lb

## 2022-12-22 DIAGNOSIS — R002 Palpitations: Secondary | ICD-10-CM | POA: Insufficient documentation

## 2022-12-22 DIAGNOSIS — R0602 Shortness of breath: Secondary | ICD-10-CM | POA: Insufficient documentation

## 2022-12-22 DIAGNOSIS — R030 Elevated blood-pressure reading, without diagnosis of hypertension: Secondary | ICD-10-CM | POA: Insufficient documentation

## 2022-12-22 NOTE — Progress Notes (Signed)
Cardiology Office Note   Date:  12/22/2022   ID:  [name], DOB [birth year], MRN [id number]  PCP:  Doreene Nest, NP  Cardiologist:   Lorine Bears, MD   Chief Complaint  Patient presents with   Follow-up    F/u zio monitor c/o fatigue and decrease stamina. Meds reviewed verbally with pt.      History of Present Illness: Christy [name] is a 77 y.o. female who was referred by Vernona Rieger for evaluation of palpitations. She has not had 3 of GERD, osteoporosis, prediabetes and hyperlipidemia.  She was evaluated at [facility] in 2000 for atypical chest pain.  She underwent a treadmill stress test that was positive for ischemia.  This was followed by a treadmill echocardiogram which was negative for ischemia.  Echo images showed normal LV systolic function with mild mitral valve prolapse.  She has been relatively healthy throughout her life.  Recently, she noticed some palpitations and abnormal rhythm when she checks her blood pressure.  Thus, she had a 2 weeks ZIO monitor done by her primary care provider which showed predominantly sinus rhythm with rare PACs.  She was noted to have short runs of supraventricular tachycardia the longest lasted 13 seconds with average heart rate of 117 bpm.  She denies chest pain but reports mild exertional dyspnea and increased fatigue.  No syncope or presyncope. She does not drink caffeinated products and drinks wine occasionally.    Past Medical History:  Diagnosis Date   Arthritis    Atrophy of vagina    Cystocele    GERD (gastroesophageal reflux disease)    Glaucoma    left eye   Heart murmur    Mitral valve prolapse    Rectocele    Uveitis    left    Past Surgical History:  Procedure Laterality Date   ABDOMINAL HYSTERECTOMY     CATARACT EXTRACTION, BILATERAL     COLONOSCOPY WITH PROPOFOL N/A 02/12/2019   Procedure: COLONOSCOPY WITH PROPOFOL;  Surgeon: Midge Minium, MD;  Location: [facility] ENDOSCOPY;  Service: Endoscopy;   Laterality: N/A;   ESOPHAGOGASTRODUODENOSCOPY (EGD) WITH PROPOFOL N/A 02/12/2019   Procedure: ESOPHAGOGASTRODUODENOSCOPY (EGD) WITH PROPOFOL;  Surgeon: Midge Minium, MD;  Location: [facility] ENDOSCOPY;  Service: Endoscopy;  Laterality: N/A;   EYE SURGERY Bilateral    lens implant   TUBAL LIGATION     VAGINAL HYSTERECTOMY Bilateral 02/12/2018   Procedure: HYSTERECTOMY VAGINAL WITH BILATERAL SALPINGO OOPHERECTOMY;  Surgeon: Herold Harms, MD;  Location: [facility] ORS;  Service: Gynecology;  Laterality: Bilateral;     Current Outpatient Medications  Medication Sig Dispense Refill   atorvastatin (LIPITOR) 10 MG tablet TAKE 1 TABLET BY MOUTH ONCE DAILY FOR CHOLESTEROL 90 tablet 3   brimonidine (ALPHAGAN) 0.2 % ophthalmic solution Place 1 drop into the left eye 2 (two) times daily.      CALCIUM CARBONATE-VITAMIN D PO Take 1 tablet by mouth 2 (two) times daily.      cholecalciferol (VITAMIN D) 1000 UNITS tablet Take 1,000 Units by mouth daily.     Cranberry 500 MG CAPS Take 500 mg by mouth daily.      denosumab (PROLIA) 60 MG/ML SOLN injection Inject 60 mg into the skin every 6 (six) months. Administer in upper arm, thigh, or abdomen     dorzolamide-timolol (COSOPT) 22.3-6.8 MG/ML ophthalmic solution Place 1 drop into the left eye every 12 (twelve) hours.     estradiol (ESTRACE) 0.1 MG/GM vaginal cream INSERT ONE-HALF GRAM VAGINALLY TWICE  WEEKLY 43 g 0   GLUCOSAMINE CHONDROITIN COMPLX PO Take 1 tablet by mouth daily.      hydrocortisone 2.5 % lotion Apply topically as directed. Apply to ears 3 nights per week Monday, Wednesday, and Fridays (Patient taking differently: Apply topically as needed. Apply to ears 3 nights per week Monday, Wednesday, and Fridays) 59 mL 6   Multiple Vitamin (MULTIVITAMIN) tablet Take 1 tablet by mouth daily.     Omega-3 Fatty Acids (FISH OIL) 1200 MG CAPS Take 2,400 mg by mouth daily.      triamcinolone cream (KENALOG) 0.1 % Apply twice daily to affected area on back up to 2  weeks as needed for rash/itching. Avoid applying to face, groin, and axilla. 30 g 2   No current facility-administered medications for this visit.    Allergies:   Xalatan [latanoprost]    Social History:  The patient  reports that she has never smoked. She has never used smokeless tobacco. She reports current alcohol use of about 1.0 standard drink of alcohol per week. She reports that she does not use drugs.   Family History:  The patient's family history includes AAA (abdominal aortic aneurysm) in her father; Aortic stenosis in her mother; Breast cancer in an other family member; Cancer in her father; Heart attack in her father and mother; Heart disease in her father and mother; Heart failure in her father; Multiple sclerosis in her daughter; Osteoporosis in her mother.    ROS:  Please see the history of present illness.   Otherwise, review of systems are positive for none.   All other systems are reviewed and negative.    PHYSICAL EXAM: VS:  BP (!) 176/88 (BP Location: Right Arm, Patient Position: Sitting, Cuff Size: Normal)   Pulse 89   Ht 5\' 4"  (1.626 m)   Wt 123 lb 6 oz (56 kg)   SpO2 98%   BMI 21.18 kg/m  , BMI Body mass index is 21.18 kg/m. GEN: Well nourished, well developed, in no acute distress  HEENT: normal  Neck: no JVD, carotid bruits, or masses Cardiac: RRR; no murmurs, rubs, or gallops,no edema .  She does have an S4. Respiratory:  clear to auscultation bilaterally, normal work of breathing GI: soft, nontender, nondistended, + BS MS: no deformity or atrophy  Skin: warm and dry, no rash Neuro:  Strength and sensation are intact Psych: euthymic mood, full affect   EKG:  EKG is not ordered today. Recent EKG done in March was reviewed and showed sinus rhythm with poor R wave progression in the anterior leads.   Recent Labs: 11/09/2022: ALT 25; BUN 13; Creatinine, Ser 0.61; Hemoglobin 14.8; Platelets 199.0; Potassium 4.0; Sodium 139; TSH 0.89    Lipid Panel     Component Value Date/Time   CHOL 181 11/09/2022 1009   TRIG 63.0 11/09/2022 1009   HDL 84.40 11/09/2022 1009   CHOLHDL 2 11/09/2022 1009   VLDL 12.6 11/09/2022 1009   LDLCALC 84 11/09/2022 1009      Wt Readings from Last 3 Encounters:  12/22/22 123 lb 6 oz (56 kg)  11/09/22 124 lb (56.2 kg)  10/18/22 125 lb (56.7 kg)           No data to display            ASSESSMENT AND PLAN:  1.  Palpitations: Her symptoms are overall mild and outpatient monitor showed only short runs of SVT.  I reassured her that this is not a serious arrhythmia.  Given that her symptoms are mild.  She does not require treatment.  If symptoms worsen in the future, a small dose beta-blocker can be considered.  2.  Shortness of breath and fatigue: Reported history of mitral valve prolapse but no murmurs by exam today.  I requested a follow-up echocardiogram.  3.  Elevated blood pressure without history of hypertension: She reports whitecoat syndrome and relatively normal readings at home.    Disposition:   FU as needed if echocardiogram is abnormal.  Signed,  Lorine Bears, MD  12/22/2022 3:10 PM    [facility]

## 2022-12-22 NOTE — Patient Instructions (Signed)
Medication Instructions:  No changes *If you need a refill on your cardiac medications before your next appointment, please call your pharmacy*   Lab Work: None ordered If you have labs (blood work) drawn today and your tests are completely normal, you will receive your results only by: MyChart Message (if you have MyChart) OR A paper copy in the mail If you have any lab test that is abnormal or we need to change your treatment, we will call you to review the results.   Testing/Procedures: Your physician has requested that you have an echocardiogram. Echocardiography is a painless test that uses sound waves to create images of your heart. It provides your doctor with information about the size and shape of your heart and how well your heart's chambers and valves are working.   You may receive an ultrasound enhancing agent through an IV if needed to better visualize your heart during the echo. This procedure takes approximately one hour.  There are no restrictions for this procedure.  This will take place at [address] ([facility] [address], [place] [address]    Follow-Up: At [facility], you and your health needs are our priority.  As part of our continuing mission to provide you with exceptional heart care, we have created designated Provider Care Teams.  These Care Teams include your primary Cardiologist (physician) and Advanced Practice Providers (APPs -  Physician Assistants and Nurse Practitioners) who all work together to provide you with the care you need, when you need it.  We recommend signing up for the patient portal called "MyChart".  Sign up information is provided on this After Visit Summary.  MyChart is used to connect with patients for Virtual Visits (Telemedicine).  Patients are able to view lab/test results, encounter notes, upcoming appointments, etc.  Non-urgent messages can be sent to your provider as well.   To learn more about what you can  do with MyChart, go to [email or web address].    Your next appointment:   Follow up as needed with Dr. Arida  

## 2022-12-30 DIAGNOSIS — H401122 Primary open-angle glaucoma, left eye, moderate stage: Secondary | ICD-10-CM | POA: Diagnosis not present

## 2022-12-30 DIAGNOSIS — H3022 Posterior cyclitis, left eye: Secondary | ICD-10-CM | POA: Diagnosis not present

## 2022-12-30 DIAGNOSIS — H40001 Preglaucoma, unspecified, right eye: Secondary | ICD-10-CM | POA: Diagnosis not present

## 2023-01-23 ENCOUNTER — Ambulatory Visit: Payer: Medicare Other | Attending: Cardiovascular Disease

## 2023-01-23 DIAGNOSIS — R0602 Shortness of breath: Secondary | ICD-10-CM | POA: Diagnosis not present

## 2023-01-23 LAB — ECHOCARDIOGRAM COMPLETE
AR max vel: 2.02 cm2
AV Area VTI: 1.83 cm2
AV Area mean vel: 1.94 cm2
AV Mean grad: 2 mmHg
AV Peak grad: 4.2 mmHg
Ao pk vel: 1.02 m/s
Area-P 1/2: 4.89 cm2
Calc EF: 57.1 %
Single Plane A2C EF: 57.7 %
Single Plane A4C EF: 58.1 %

## 2023-04-12 ENCOUNTER — Telehealth: Payer: Self-pay | Admitting: Primary Care

## 2023-04-12 ENCOUNTER — Telehealth: Payer: Self-pay

## 2023-04-12 NOTE — Telephone Encounter (Signed)
Prolia VOB initiated via [email or web address] 

## 2023-04-12 NOTE — Telephone Encounter (Signed)
Created new encounter for Prolia BIV. Will route encounter back once benefit verification is complete.  

## 2023-04-12 NOTE — Telephone Encounter (Signed)
Please verify benefits  

## 2023-04-12 NOTE — Telephone Encounter (Signed)
Patient called in and wanted to schedule her prolia.

## 2023-04-17 NOTE — Telephone Encounter (Signed)
Reviewed patient chart has appointment to day with Dr. Alphonsus Sias

## 2023-04-17 NOTE — Telephone Encounter (Signed)
Pt ready for scheduling for PROLIA on or after : 04/17/23  Out-of-pocket cost due at time of visit: $0  Primary: MEDICARE Prolia co-insurance: 0% Admin fee co-insurance: 0%  Secondary: UHC MEDSUP Prolia co-insurance:  Admin fee co-insurance:   Medical Benefit Details: Date Benefits were checked: 04/14/23 Deductible: $240 MET OF $240 REQUIRED/ Coinsurance: 0%/ Admin Fee: 0%  Prior Auth: N/A PA# Expiration Date:   # of doses approved:  Pharmacy benefit: Copay $--- If patient wants fill through the pharmacy benefit please send prescription to:  --- , and include estimated need by date in rx notes. Pharmacy will ship medication directly to the office.  Patient NOT eligible for Prolia Copay Card. Copay Card can make patient's cost as little as $25. Link to apply: [email or web address]://www.amgensupportplus.[email or web address]/copay  ** This summary of benefits is an estimation of the patient's out-of-pocket cost. Exact cost may very based on individual plan coverage.

## 2023-04-18 ENCOUNTER — Ambulatory Visit (INDEPENDENT_AMBULATORY_CARE_PROVIDER_SITE_OTHER): Payer: Medicare Other | Admitting: Internal Medicine

## 2023-04-18 ENCOUNTER — Encounter: Payer: Self-pay | Admitting: Internal Medicine

## 2023-04-18 ENCOUNTER — Telehealth: Payer: Self-pay | Admitting: Primary Care

## 2023-04-18 VITALS — BP 138/88 | HR 77 | Temp 97.6°F | Ht 64.0 in | Wt 118.0 lb

## 2023-04-18 DIAGNOSIS — E785 Hyperlipidemia, unspecified: Secondary | ICD-10-CM

## 2023-04-18 DIAGNOSIS — M810 Age-related osteoporosis without current pathological fracture: Secondary | ICD-10-CM | POA: Diagnosis not present

## 2023-04-18 DIAGNOSIS — R42 Dizziness and giddiness: Secondary | ICD-10-CM | POA: Insufficient documentation

## 2023-04-18 LAB — COMPREHENSIVE METABOLIC PANEL
ALT: 24 U/L (ref 0–35)
AST: 27 U/L (ref 0–37)
Albumin: 4.4 g/dL (ref 3.5–5.2)
Alkaline Phosphatase: 34 U/L — ABNORMAL LOW (ref 39–117)
BUN: 16 mg/dL (ref 6–23)
CO2: 30 mEq/L (ref 19–32)
Calcium: 9.8 mg/dL (ref 8.4–10.5)
Chloride: 102 mEq/L (ref 96–112)
Creatinine, Ser: 0.7 mg/dL (ref 0.40–1.20)
GFR: 82.84 mL/min (ref >60.00)
Glucose, Bld: 96 mg/dL (ref 70–99)
Potassium: 4.1 mEq/L (ref 3.5–5.1)
Sodium: 140 mEq/L (ref 135–145)
Total Bilirubin: 0.7 mg/dL (ref 0.2–1.2)
Total Protein: 6.5 g/dL (ref 6.0–8.3)

## 2023-04-18 LAB — CBC
HCT: 44.1 % (ref 36.0–46.0)
Hemoglobin: 14.2 g/dL (ref 12.0–15.0)
MCHC: 32.2 g/dL (ref 30.0–36.0)
MCV: 98.6 fl (ref 78.0–100.0)
Platelets: 198 10*3/uL (ref 150.0–400.0)
RBC: 4.47 Mil/uL (ref 3.87–5.11)
RDW: 13.5 % (ref 11.5–15.5)
WBC: 5.9 10*3/uL (ref 4.0–10.5)

## 2023-04-18 LAB — TSH: TSH: 0.93 u[IU]/mL (ref 0.35–5.50)

## 2023-04-18 NOTE — Patient Instructions (Signed)
Please try increasing your salt intake (as well as fluids). If you continue to have the dizziness, there are some medications we could consider to help this

## 2023-04-18 NOTE — Assessment & Plan Note (Addendum)
Here her supine BP was 140/80 and standing 140/100 Heart rated 72 and 78 Her measurements after moving bowels show orthostatic dizziness but not the threshold for true orthostatic hypotension all the time Certainly not now Discussed liberalized salt intake Could consider fludrocortisone if that helps but not enough Midodrine would be other consideration Will check labs

## 2023-04-18 NOTE — Progress Notes (Signed)
Subjective:    Patient ID: Christy [name], female    DOB: February 14, 1946, 77 y.o.   MRN: [id number]  HPI Here due to dizziness and low blood pressure  Even months ago--would have "tired" times and dizziness in the morning Had zio monitor after yearly visit---short episodes of SVT (no Rx) Still gets the episodic dizzy spells in the morning Checks her BP--and doing it standing and sitting--and quite low Usually after AM bowel movements (tend to be loose and 1-3 times)--several minutes after No syncope No chest pain No palpitations --rarely aware of her heartbeat  Sitting 137/80, standing 115/82 (early AM) without symptoms Then after moving bowels--- standing 90-105/70's and sitting will be 10-66mmHg higher  Current Outpatient Medications on File Prior to Visit  Medication Sig Dispense Refill   atorvastatin (LIPITOR) 10 MG tablet TAKE 1 TABLET BY MOUTH ONCE DAILY FOR CHOLESTEROL 90 tablet 3   brimonidine (ALPHAGAN) 0.2 % ophthalmic solution Place 1 drop into the left eye 2 (two) times daily.      CALCIUM CARBONATE-VITAMIN D PO Take 1 tablet by mouth 2 (two) times daily.      cholecalciferol (VITAMIN D) 1000 UNITS tablet Take 1,000 Units by mouth daily.     Cranberry 500 MG CAPS Take 500 mg by mouth daily.      denosumab (PROLIA) 60 MG/ML SOLN injection Inject 60 mg into the skin every 6 (six) months. Administer in upper arm, thigh, or abdomen     dorzolamide-timolol (COSOPT) 22.3-6.8 MG/ML ophthalmic solution Place 1 drop into the left eye every 12 (twelve) hours.     estradiol (ESTRACE) 0.1 MG/GM vaginal cream INSERT ONE-HALF GRAM VAGINALLY TWICE WEEKLY 43 g 0   GLUCOSAMINE CHONDROITIN COMPLX PO Take 1 tablet by mouth daily.      hydrocortisone 2.5 % lotion Apply topically as directed. Apply to ears 3 nights per week Monday, Wednesday, and Fridays (Patient taking differently: Apply topically as needed. Apply to ears 3 nights per week Monday, Wednesday, and Fridays) 59 mL 6   Multiple  Vitamin (MULTIVITAMIN) tablet Take 1 tablet by mouth daily.     Omega-3 Fatty Acids (FISH OIL) 1200 MG CAPS Take 2,400 mg by mouth daily.      triamcinolone cream (KENALOG) 0.1 % Apply twice daily to affected area on back up to 2 weeks as needed for rash/itching. Avoid applying to face, groin, and axilla. 30 g 2   No current facility-administered medications on file prior to visit.    Allergies  Allergen Reactions   Xalatan [Latanoprost] Itching, Swelling and Other (See Comments)    Caused eye to be red, swollen,itchy    Past Medical History:  Diagnosis Date   Arthritis    Atrophy of vagina    Cystocele    GERD (gastroesophageal reflux disease)    Glaucoma    left eye   Heart murmur    Mitral valve prolapse    Rectocele    Uveitis    left    Past Surgical History:  Procedure Laterality Date   ABDOMINAL HYSTERECTOMY     CATARACT EXTRACTION, BILATERAL     COLONOSCOPY WITH PROPOFOL N/A 02/12/2019   Procedure: COLONOSCOPY WITH PROPOFOL;  Surgeon: Midge Minium, MD;  Location: [facility] ENDOSCOPY;  Service: Endoscopy;  Laterality: N/A;   ESOPHAGOGASTRODUODENOSCOPY (EGD) WITH PROPOFOL N/A 02/12/2019   Procedure: ESOPHAGOGASTRODUODENOSCOPY (EGD) WITH PROPOFOL;  Surgeon: Midge Minium, MD;  Location: [facility] ENDOSCOPY;  Service: Endoscopy;  Laterality: N/A;   EYE SURGERY Bilateral  lens implant   TUBAL LIGATION     VAGINAL HYSTERECTOMY Bilateral 02/12/2018   Procedure: HYSTERECTOMY VAGINAL WITH BILATERAL SALPINGO OOPHERECTOMY;  Surgeon: Herold Harms, MD;  Location: [facility] ORS;  Service: Gynecology;  Laterality: Bilateral;    Family History  Problem Relation Age of Onset   Heart attack Mother    Aortic stenosis Mother    Heart disease Mother    Osteoporosis Mother    Heart attack Father    Heart failure Father    Cancer Father        prostate   Heart disease Father    AAA (abdominal aortic aneurysm) Father    Multiple sclerosis Daughter    Breast cancer Other     Social  History   Socioeconomic History   Marital status: Widowed    Spouse name: Not on file   Number of children: Not on file   Years of education: Not on file   Highest education level: Bachelor's degree (e.g., BA, AB, BS)  Occupational History   Not on file  Tobacco Use   Smoking status: Never   Smokeless tobacco: Never  Vaping Use   Vaping status: Never Used  Substance and Sexual Activity   Alcohol use: Yes    Alcohol/week: 1.0 standard drink of alcohol    Types: 1 Glasses of wine per week    Comment: wine weekly   Drug use: No   Sexual activity: Not Currently    Birth control/protection: Surgical  Other Topics Concern   Not on file  Social History Narrative   Recently moved to [facility] from [place].   She is primary caregiver for her husband who has Alzheimers.   Two children.      Retired Metallurgist.      Desires CPR.   Has living will- SCANNED INTO CHART   Would not want prolonged life support if futile.                  Social Determinants of Health   Financial Resource Strain: Low Risk  (04/17/2023)   Overall Financial Resource Strain (CARDIA)    Difficulty of Paying Living Expenses: Not hard at all  Food Insecurity: No Food Insecurity (04/17/2023)   Hunger Vital Sign    Worried About Running Out of Food in the Last Year: Never true    Ran Out of Food in the Last Year: Never true  Transportation Needs: No Transportation Needs (04/17/2023)   PRAPARE - Administrator, Civil Service (Medical): No    Lack of Transportation (Non-Medical): No  Physical Activity: Insufficiently Active (04/17/2023)   Exercise Vital Sign    Days of Exercise per Week: 7 days    Minutes of Exercise per Session: 20 min  Stress: No Stress Concern Present (04/17/2023)   [employer] of Occupational Health - Occupational Stress Questionnaire    Feeling of Stress : Only a little  Social Connections: Moderately Integrated (04/17/2023)   Social Connection and  Isolation Panel [NHANES]    Frequency of Communication with Friends and Family: More than three times a week    Frequency of Social Gatherings with Friends and Family: Twice a week    Attends Religious Services: 1 to 4 times per year    Active Member of [employer] or Organizations: Yes    Attends Banker Meetings: More than 4 times per year    Marital Status: Widowed  Intimate Partner Violence: Not At Risk (10/18/2022)  Humiliation, Afraid, Rape, and Kick questionnaire    Fear of Current or Ex-Partner: No    Emotionally Abused: No    Physically Abused: No    Sexually Abused: No   Review of Systems Walks every morning--no symptoms with this (before breakfast and her bowels going) No health food supplements, etc    Objective:   Physical Exam Constitutional:      Appearance: Normal appearance.  Cardiovascular:     Rate and Rhythm: Normal rate and regular rhythm.     Heart sounds: No murmur heard.    No gallop.  Pulmonary:     Effort: Pulmonary effort is normal.     Breath sounds: Normal breath sounds. No wheezing or rales.  Musculoskeletal:     Cervical back: Neck supple.     Right lower leg: No edema.     Left lower leg: No edema.  Lymphadenopathy:     Cervical: No cervical adenopathy.  Neurological:     Mental Status: She is alert.            Assessment & Plan:

## 2023-04-18 NOTE — Telephone Encounter (Signed)
Pt was instructed to schedule prolia ink during ov with [place] on today, 8/27. Can this be scheduled for pt? Call back # [phone]

## 2023-04-18 NOTE — Assessment & Plan Note (Signed)
Overdue for prolia Will check calcium and schedule this

## 2023-04-18 NOTE — Addendum Note (Signed)
Addended by: Tillman Abide I on: 04/18/2023 11:36 AM   Modules accepted: Orders

## 2023-04-21 NOTE — Telephone Encounter (Signed)
Appointment made and documented under pharmacy encounter.

## 2023-04-21 NOTE — Telephone Encounter (Signed)
Called patient reviewed all following information including appointment, Co pay due at time of visit and if pick up of injection is needed from outside pharmacy.     Out of pocket for patient: $0  Lab appointment :   Nurse visit:  04/26/23  Lab order placed: No labs done and checked   Prolia has been  [x]   Ordered  []   Script sent to local pharmacy for patient to bring   []   Script sent to Specialty pharmacy   []   Script sent to [facility] to deliver

## 2023-04-26 ENCOUNTER — Ambulatory Visit (INDEPENDENT_AMBULATORY_CARE_PROVIDER_SITE_OTHER): Payer: Medicare Other

## 2023-04-26 DIAGNOSIS — M810 Age-related osteoporosis without current pathological fracture: Secondary | ICD-10-CM | POA: Diagnosis not present

## 2023-04-26 MED ORDER — DENOSUMAB 60 MG/ML ~~LOC~~ SOSY
60.0000 mg | PREFILLED_SYRINGE | Freq: Once | SUBCUTANEOUS | Status: AC
  Administered 2023-04-26: 60 mg via SUBCUTANEOUS

## 2023-04-26 NOTE — Progress Notes (Signed)
Patient presented for 6-month Prolia injection SQ to left arm given by Jessica Isley, CMA. Patient tolerated injection well. 

## 2023-05-08 DIAGNOSIS — N952 Postmenopausal atrophic vaginitis: Secondary | ICD-10-CM

## 2023-05-09 MED ORDER — ESTRADIOL 0.1 MG/GM VA CREA
TOPICAL_CREAM | VAGINAL | 0 refills | Status: DC
Start: 2023-05-09 — End: 2024-01-30

## 2023-05-19 DIAGNOSIS — Z23 Encounter for immunization: Secondary | ICD-10-CM | POA: Diagnosis not present

## 2023-06-15 DIAGNOSIS — Z23 Encounter for immunization: Secondary | ICD-10-CM | POA: Diagnosis not present

## 2023-07-04 DIAGNOSIS — H3022 Posterior cyclitis, left eye: Secondary | ICD-10-CM | POA: Diagnosis not present

## 2023-07-04 DIAGNOSIS — H40001 Preglaucoma, unspecified, right eye: Secondary | ICD-10-CM | POA: Diagnosis not present

## 2023-07-04 DIAGNOSIS — H401122 Primary open-angle glaucoma, left eye, moderate stage: Secondary | ICD-10-CM | POA: Diagnosis not present

## 2023-08-09 ENCOUNTER — Other Ambulatory Visit: Payer: Self-pay | Admitting: Primary Care

## 2023-08-09 DIAGNOSIS — Z1231 Encounter for screening mammogram for malignant neoplasm of breast: Secondary | ICD-10-CM

## 2023-09-21 ENCOUNTER — Ambulatory Visit
Admission: RE | Admit: 2023-09-21 | Discharge: 2023-09-21 | Disposition: A | Discharge status: Home / Self Care | Payer: Medicare Other | Source: Ambulatory Visit | Attending: Primary Care | Admitting: Primary Care

## 2023-09-21 DIAGNOSIS — Z1231 Encounter for screening mammogram for malignant neoplasm of breast: Secondary | ICD-10-CM | POA: Insufficient documentation

## 2023-09-25 ENCOUNTER — Other Ambulatory Visit: Payer: Self-pay | Admitting: Primary Care

## 2023-09-25 DIAGNOSIS — R928 Other abnormal and inconclusive findings on diagnostic imaging of breast: Secondary | ICD-10-CM

## 2023-10-03 ENCOUNTER — Ambulatory Visit
Admission: RE | Admit: 2023-10-03 | Discharge: 2023-10-03 | Disposition: A | Discharge status: Home / Self Care | Payer: Medicare Other | Source: Ambulatory Visit | Attending: Primary Care | Admitting: Primary Care

## 2023-10-03 DIAGNOSIS — R928 Other abnormal and inconclusive findings on diagnostic imaging of breast: Secondary | ICD-10-CM | POA: Insufficient documentation

## 2023-10-03 DIAGNOSIS — R92343 Mammographic extreme density, bilateral breasts: Secondary | ICD-10-CM | POA: Diagnosis not present

## 2023-10-03 DIAGNOSIS — N6315 Unspecified lump in the right breast, overlapping quadrants: Secondary | ICD-10-CM | POA: Diagnosis not present

## 2023-10-25 ENCOUNTER — Ambulatory Visit: Payer: Medicare Other

## 2023-10-25 ENCOUNTER — Telehealth: Payer: Self-pay

## 2023-10-25 VITALS — Ht 64.0 in | Wt 118.0 lb

## 2023-10-25 DIAGNOSIS — Z0000 Encounter for general adult medical examination without abnormal findings: Secondary | ICD-10-CM

## 2023-10-25 MED ORDER — DENOSUMAB 60 MG/ML ~~LOC~~ SOSY: 60.0000 mg | PREFILLED_SYRINGE | Freq: Once | SUBCUTANEOUS | Status: DC

## 2023-10-25 NOTE — Telephone Encounter (Signed)
 Prolia VOB initiated via [email or web address]  Next Prolia inj DUE: NOW

## 2023-10-25 NOTE — Progress Notes (Signed)
 Subjective:   Christy [name] is a 78 y.o. who presents for a Medicare Wellness preventive visit.  Visit Complete: Virtual I connected with  [name] on 10/25/23 by a audio enabled telemedicine application and verified that I am speaking with the correct person using two identifiers.  Patient Location: Home  Provider Location: Office/Clinic  I discussed the limitations of evaluation and management by telemedicine. The patient expressed understanding and agreed to proceed.  Vital Signs: Because this visit was a virtual/telehealth visit, some criteria may be missing or patient reported. Any vitals not documented were not able to be obtained and vitals that have been documented are patient reported.  VideoDeclined- This patient declined Librarian, academic. Therefore the visit was completed with audio only.  AWV Questionnaire: Yes: Patient Medicare AWV questionnaire was completed by the patient on 10/24/23; I have confirmed that all information answered by patient is correct and no changes since this date.  Cardiac Risk Factors include: advanced age (>25men, >57 women);dyslipidemia     Objective:    Today's Vitals   10/25/23 1022  Weight: 118 lb (53.5 kg)  Height: 5\' 4"  (1.626 m)   Body mass index is 20.25 kg/m.     10/25/2023   10:32 AM 10/18/2022    9:06 AM 10/13/2021    9:05 AM 07/24/2020    2:03 PM 07/03/2019    9:56 AM 02/12/2019    7:53 AM 06/22/2018   11:11 AM  Advanced Directives  Does Patient Have a Medical Advance Directive? Yes Yes Yes Yes Yes Yes Yes  Type of Estate agent of [place];Living will Healthcare Power of [place];Living will Healthcare Power of [place];Living will Healthcare Power of [place];Living will Healthcare Power of [place];Living will Healthcare Power of [place];Living will;Out of facility DNR (pink MOST or yellow form) Healthcare Power of [place];Living will  Does patient want to make  changes to medical advance directive?  No - Patient declined Yes (MAU/Ambulatory/Procedural Areas - Information given)      Copy of Healthcare Power of Attorney in Chart? Yes - validated most recent copy scanned in chart (See row information) Yes - validated most recent copy scanned in chart (See row information)  Yes - validated most recent copy scanned in chart (See row information) Yes - validated most recent copy scanned in chart (See row information) Yes - validated most recent copy scanned in chart (See row information) Yes    Current Medications (verified) Outpatient Encounter Medications as of 10/25/2023  Medication Sig   atorvastatin (LIPITOR) 10 MG tablet TAKE 1 TABLET BY MOUTH ONCE DAILY FOR CHOLESTEROL   brimonidine (ALPHAGAN) 0.2 % ophthalmic solution Place 1 drop into the left eye 2 (two) times daily.    CALCIUM CARBONATE-VITAMIN D PO Take 1 tablet by mouth 2 (two) times daily.    cholecalciferol (VITAMIN D) 1000 UNITS tablet Take 1,000 Units by mouth daily.   Cranberry 500 MG CAPS Take 500 mg by mouth daily.    denosumab (PROLIA) 60 MG/ML SOLN injection Inject 60 mg into the skin every 6 (six) months. Administer in upper arm, thigh, or abdomen   dorzolamide-timolol (COSOPT) 22.3-6.8 MG/ML ophthalmic solution Place 1 drop into the left eye every 12 (twelve) hours.   estradiol (ESTRACE) 0.1 MG/GM vaginal cream INSERT ONE-HALF GRAM VAGINALLY TWICE WEEKLY   GLUCOSAMINE CHONDROITIN COMPLX PO Take 1 tablet by mouth daily.    hydrocortisone 2.5 % lotion Apply topically as directed. Apply to ears 3 nights per week Monday,  Wednesday, and Fridays (Patient taking differently: Apply topically as needed. Apply to ears 3 nights per week Monday, Wednesday, and Fridays)   Multiple Vitamin (MULTIVITAMIN) tablet Take 1 tablet by mouth daily.   Omega-3 Fatty Acids (FISH OIL) 1200 MG CAPS Take 2,400 mg by mouth daily.    triamcinolone cream (KENALOG) 0.1 % Apply twice daily to affected area on back up to  2 weeks as needed for rash/itching. Avoid applying to face, groin, and axilla.   No facility-administered encounter medications on file as of 10/25/2023.    Allergies (verified) Xalatan [latanoprost]   History: Past Medical History:  Diagnosis Date   Arthritis    Atrophy of vagina    Cystocele    GERD (gastroesophageal reflux disease)    Glaucoma    left eye   Heart murmur    Mitral valve prolapse    Rectocele    Uveitis    left   Past Surgical History:  Procedure Laterality Date   ABDOMINAL HYSTERECTOMY     CATARACT EXTRACTION, BILATERAL     COLONOSCOPY WITH PROPOFOL N/A 02/12/2019   Procedure: COLONOSCOPY WITH PROPOFOL;  Surgeon: Midge Minium, MD;  Location: [facility] ENDOSCOPY;  Service: Endoscopy;  Laterality: N/A;   ESOPHAGOGASTRODUODENOSCOPY (EGD) WITH PROPOFOL N/A 02/12/2019   Procedure: ESOPHAGOGASTRODUODENOSCOPY (EGD) WITH PROPOFOL;  Surgeon: Midge Minium, MD;  Location: [facility] ENDOSCOPY;  Service: Endoscopy;  Laterality: N/A;   EYE SURGERY Bilateral    lens implant   TUBAL LIGATION     VAGINAL HYSTERECTOMY Bilateral 02/12/2018   Procedure: HYSTERECTOMY VAGINAL WITH BILATERAL SALPINGO OOPHERECTOMY;  Surgeon: Herold Harms, MD;  Location: [facility] ORS;  Service: Gynecology;  Laterality: Bilateral;   Family History  Problem Relation Age of Onset   Heart attack Mother    Aortic stenosis Mother    Heart disease Mother    Osteoporosis Mother    Heart attack Father    Heart failure Father    Cancer Father        prostate   Heart disease Father    AAA (abdominal aortic aneurysm) Father    Multiple sclerosis Daughter    Breast cancer Other    Social History   Socioeconomic History   Marital status: Widowed    Spouse name: Not on file   Number of children: Not on file   Years of education: Not on file   Highest education level: Bachelor's degree (e.g., BA, AB, BS)  Occupational History   Not on file  Tobacco Use   Smoking status: Never   Smokeless tobacco:  Never  Vaping Use   Vaping status: Never Used  Substance and Sexual Activity   Alcohol use: Yes    Alcohol/week: 1.0 standard drink of alcohol    Types: 1 Glasses of wine per week    Comment: wine weekly   Drug use: No   Sexual activity: Not Currently    Birth control/protection: Surgical  Other Topics Concern   Not on file  Social History Narrative   Recently moved to [facility] from [place].   She is primary caregiver for her husband who has Alzheimers.   Two children.      Retired Metallurgist.      Desires CPR.   Has living will- SCANNED INTO CHART   Would not want prolonged life support if futile.                  Social Drivers of Corporate investment banker Strain: Low  Risk  (10/25/2023)   Overall Financial Resource Strain (CARDIA)    Difficulty of Paying Living Expenses: Not hard at all  Food Insecurity: No Food Insecurity (10/25/2023)   Hunger Vital Sign    Worried About Running Out of Food in the Last Year: Never true    Ran Out of Food in the Last Year: Never true  Transportation Needs: No Transportation Needs (10/25/2023)   PRAPARE - Administrator, Civil Service (Medical): No    Lack of Transportation (Non-Medical): No  Physical Activity: Insufficiently Active (10/25/2023)   Exercise Vital Sign    Days of Exercise per Week: 7 days    Minutes of Exercise per Session: 20 min  Stress: No Stress Concern Present (10/25/2023)   [employer] of Occupational Health - Occupational Stress Questionnaire    Feeling of Stress : Not at all  Social Connections: Moderately Isolated (10/25/2023)   Social Connection and Isolation Panel [NHANES]    Frequency of Communication with Friends and Family: More than three times a week    Frequency of Social Gatherings with Friends and Family: Three times a week    Attends Religious Services: More than 4 times per year    Active Member of Clubs or Organizations: No    Attends Banker Meetings:  Never    Marital Status: Widowed    Tobacco Counseling Counseling given: Not Answered  Clinical Intake:  Pre-visit preparation completed: Yes  Pain : No/denies pain    BMI - recorded: 20.25 Nutritional Status: BMI of 19-24  Normal Nutritional Risks: None Diabetes: No  How often do you need to have someone help you when you read instructions, pamphlets, or other written materials from your doctor or pharmacy?: 1 - Never  Interpreter Needed?: No  Comments: lives alone Information entered by :: B.Briannia Laba,LPN  Activities of Daily Living     10/24/2023    1:53 PM  In your present state of health, do you have any difficulty performing the following activities:  Hearing? 0  Vision? 0  Difficulty concentrating or making decisions? 0  Walking or climbing stairs? 0  Dressing or bathing? 0  Doing errands, shopping? 0  Preparing Food and eating ? N  Using the Toilet? N  In the past six months, have you accidently leaked urine? Y  Do you have problems with loss of bowel control? N  Managing your Medications? N  Managing your Finances? N  Housekeeping or managing your Housekeeping? N    Patient Care Team: Doreene Nest, NP as PCP - General (Internal Medicine) Lockie Mola, MD as Referring Physician (Ophthalmology) Defrancesco, Prentice Docker, MD as Consulting Physician (Obstetrics and Gynecology)  Indicate any recent Medical Services you may have received from other than [facility] providers in the past year (date may be approximate).     Assessment:   This is a routine wellness examination for Keiarra.  Hearing/Vision screen Hearing Screening - Comments:: Pt says her hearing is good other than in a loud place Vision Screening - Comments:: Pt says her vision is good with glasses though glaucoma Dr Inez Pilgrim   Goals Addressed             This Visit's Progress    Patient Stated   On track    10/25/23- I will maintain and continue medications as prescribed.       Patient Stated   On track    10/25/23- I will continue to walk everyday for 20-30 minutes.  Patient Stated   On track    10/25/23-Would like to maintain current routine      COMPLETED: Patient Stated   On track    Maintain current level of function.       Depression Screen     10/25/2023   10:29 AM 11/09/2022    9:26 AM 10/18/2022    9:05 AM 10/13/2021    9:11 AM 08/07/2020    9:59 AM 07/24/2020    2:05 PM 07/03/2019   10:00 AM  PHQ 2/9 Scores  PHQ - 2 Score 0 0 0 0 0 0 0  PHQ- 9 Score      0 0    Fall Risk     10/24/2023    1:53 PM 11/09/2022    9:26 AM 10/18/2022    9:07 AM 10/14/2022    9:31 AM 10/13/2021    9:08 AM  Fall Risk   Falls in the past year? 0 0 0 0 0  Number falls in past yr: 0 0 0 0 0  Injury with Fall? 0 0 0 0 0  Risk for fall due to : No Fall Risks No Fall Risks No Fall Risks  No Fall Risks  Follow up Falls prevention discussed;Education provided Falls evaluation completed Falls prevention discussed;Falls evaluation completed  Falls prevention discussed    MEDICARE RISK AT HOME:  Medicare Risk at Home Any stairs in or around the home?: (Patient-Rptd) No If so, are there any without handrails?: (Patient-Rptd) No Home free of loose throw rugs in walkways, pet beds, electrical cords, etc?: (Patient-Rptd) Yes Adequate lighting in your home to reduce risk of falls?: (Patient-Rptd) Yes Life alert?: (Patient-Rptd) No Use of a cane, walker or w/c?: (Patient-Rptd) No Grab bars in the bathroom?: (Patient-Rptd) Yes Shower chair or bench in shower?: (Patient-Rptd) No Elevated toilet seat or a handicapped toilet?: (Patient-Rptd) No  TIMED UP AND GO:  Was the test performed?  No  Cognitive Function: 6CIT completed    07/24/2020    2:06 PM 07/03/2019   10:06 AM 06/22/2018   11:11 AM  MMSE - Mini Mental State Exam  Orientation to time 5 5 5   Orientation to Place 5 5 5   Registration 3 3 3   Attention/ Calculation 5 5 0  Recall 3 3 3   Language- name 2 objects   0   Language- repeat 1 1 1   Language- follow 3 step command   3  Language- read & follow direction   0  Write a sentence   0  Copy design   0  Total score   20        10/25/2023   10:34 AM 10/18/2022    9:09 AM  6CIT Screen  What Year? 0 points 0 points  What month? 0 points 0 points  What time? 0 points 0 points  Count back from 20 0 points 0 points  Months in reverse 0 points 0 points  Repeat phrase 0 points 0 points  Total Score 0 points 0 points    Immunizations Immunization History  Administered Date(s) Administered   Fluad Quad(high Dose 65+) 05/21/2019, 05/02/2020   Influenza, Seasonal, Injecte, Preservative Fre 06/29/2006, 07/04/2007, 06/24/2008, 05/28/2009, 06/14/2010, 06/03/2011, 05/28/2012   Influenza,inj,Quad PF,6+ Mos 05/06/2013, 05/20/2014, 06/09/2015, 06/15/2017, 06/22/2018   Influenza-Unspecified 06/03/2021, 05/22/2022   Moderna Sars-Covid-2 Vaccination 09/05/2019, 10/03/2019, 07/07/2020, 12/30/2020   Pfizer Covid-19 Vaccine Bivalent Booster 68yrs & up 05/13/2021   Pneumococcal Conjugate-13 07/10/2014   Pneumococcal Polysaccharide-23 01/05/2010   Pneumococcal-Unspecified 01/05/2010  Td 08/22/1996, 12/21/2006, 07/26/2017   Tdap 12/21/2006   Zoster Recombinant(Shingrix) 03/29/2018, 07/04/2018   Zoster, Live 02/24/2006    Screening Tests Health Maintenance  Topic Date Due   COVID-19 Vaccine (6 - 2024-25 season) 04/23/2023   Medicare Annual Wellness (AWV)  10/24/2024   DTaP/Tdap/Td (5 - Td or Tdap) 07/27/2027   Pneumonia Vaccine 28+ Years old  Completed   INFLUENZA VACCINE  Completed   DEXA SCAN  Completed   Hepatitis C Screening  Completed   Zoster Vaccines- Shingrix  Completed   HPV VACCINES  Aged Out   Colonoscopy  Discontinued    Health Maintenance  Health Maintenance Due  Topic Date Due   COVID-19 Vaccine (6 - 2024-25 season) 04/23/2023   Health Maintenance Items Addressed: none needed  Additional Screening:  Vision Screening: Recommended  annual ophthalmology exams for early detection of glaucoma and other disorders of the eye.  Dental Screening: Recommended annual dental exams for proper oral hygiene  Community Resource Referral / Chronic Care Management: CRR required this visit?  No   CCM required this visit?  No     Plan:     I have personally reviewed and noted the following in the patient's chart:   Medical and social history Use of alcohol, tobacco or illicit drugs  Current medications and supplements including opioid prescriptions. Patient is not currently taking opioid prescriptions. Functional ability and status Nutritional status Physical activity Advanced directives List of other physicians Hospitalizations, surgeries, and ER visits in previous 12 months Vitals Screenings to include cognitive, depression, and falls Referrals and appointments  In addition, I have reviewed and discussed with patient certain preventive protocols, quality metrics, and best practice recommendations. A written personalized care plan for preventive services as well as general preventive health recommendations were provided to patient.    Sue Lush, LPN   08/27/1094   After Visit Summary: (MyChart) Due to this being a telephonic visit, the after visit summary with patients personalized plan was offered to patient via MyChart   Notes: Pt says she has increased her salt and water intake since experiencing BP drops. She relays they have gotten better. She relays she is due for Prolia injection this month. Pt informed staff is working on authorization for Prolia to schedule to sync with 11/14/23 appt w/PCP.

## 2023-10-25 NOTE — Patient Instructions (Signed)
 Christy [name] , Thank you for taking time to come for your Medicare Wellness Visit. I appreciate your ongoing commitment to your health goals. Please review the following plan we discussed and let me know if I can assist you in the future.   Referrals/Orders/Follow-Ups/Clinician Recommendations: none  This is a list of the screening recommended for you and due dates:  Health Maintenance  Topic Date Due   COVID-19 Vaccine (6 - 2024-25 season) 04/23/2023   Medicare Annual Wellness Visit  10/24/2024   DTaP/Tdap/Td vaccine (5 - Td or Tdap) 07/27/2027   Pneumonia Vaccine  Completed   Flu Shot  Completed   DEXA scan (bone density measurement)  Completed   Hepatitis C Screening  Completed   Zoster (Shingles) Vaccine  Completed   HPV Vaccine  Aged Out   Colon Cancer Screening  Discontinued    Advanced directives: (In Chart) A copy of your advanced directives are scanned into your chart should your provider ever need it.  Next Medicare Annual Wellness Visit scheduled for next year: Yes 10/30/2024 @ 10:10am televisit

## 2023-10-25 NOTE — Addendum Note (Signed)
 Addended by: Donnamarie Poag on: 10/25/2023 10:50 AM   Modules accepted: Orders

## 2023-11-03 ENCOUNTER — Telehealth: Payer: Self-pay | Admitting: *Deleted

## 2023-11-03 NOTE — Telephone Encounter (Signed)
 Pt ready for scheduling for PROLIA on or after : 11/03/23  Option# 1: Buy/Bill (Office supplied medication)  Out-of-pocket cost due at time of clinic visit: $0  Number of injection/visits approved: ---  Primary: MEDICARE Prolia co-insurance: 0% Admin fee co-insurance: 0%  Secondary: UHC AARP-MEDSUP Prolia co-insurance: Covers the Medicare Part B deductible, co-insurance and 100% of the excess charges Admin fee co-insurance:   Medical Benefit Details: Date Benefits were checked: 10/26/23 Deductible: $92.73 Met of $257 Required/ Coinsurance: 0%/ Admin Fee: 0%  Prior Auth: N/A PA# Expiration Date:   # of doses approved: ----------------------------------------------------------------------- Option# 2- Med Obtained from pharmacy:  Pharmacy benefit: Copay $--- (Paid to pharmacy) Admin Fee: --- (Pay at clinic)  Prior Auth: N/A PA# Expiration Date:   # of doses approved:   If patient wants fill through the pharmacy benefit please send prescription to:  --- , and include estimated need by date in rx notes. Pharmacy will ship medication directly to the office.  Patient NOT eligible for Prolia Copay Card. Copay Card can make patient's cost as little as $25. Link to apply: https://www.amgensupportplus.com/copay  ** This summary of benefits is an estimation of the patient's out-of-pocket cost. Exact cost may very based on individual plan coverage.

## 2023-11-03 NOTE — Telephone Encounter (Signed)
 Copied from CRM [id number]. Topic: Clinical - Medication Question >> Nov 03, 2023  1:28 PM Theodis Sato wrote: Reason for CRM: * Message for Duane Lope, CPhT Pharmacy Technician * Patient states she missed your call and is requesting your call her back and state she will be home all day and will await you call. Patient would like to discuss getting her calcium level checked before starting prolia as well.

## 2023-11-07 NOTE — Telephone Encounter (Signed)
 Copied from CRM [id number]. Topic: General - Other >> Nov 06, 2023  4:42 PM Taleah C wrote: Reason for CRM: pt called and first stated that she was returning a call from [place] from Friday for a prolia injection. I reviewed her chart and saw that she was approved to receive her Prolia injection after 3/14. I offered to schedule the appt but patient stated that she prefers to speak with a nurse first to better understand what labs she will be completing next week for her CPE and the appropriate time to schedule her Prolia injection. Please callback and advise.

## 2023-11-14 ENCOUNTER — Ambulatory Visit (INDEPENDENT_AMBULATORY_CARE_PROVIDER_SITE_OTHER): Payer: Medicare Other | Admitting: Primary Care

## 2023-11-14 ENCOUNTER — Encounter: Payer: Self-pay | Admitting: Primary Care

## 2023-11-14 VITALS — BP 136/80 | HR 76 | Temp 97.5°F | Ht 64.0 in | Wt 123.0 lb

## 2023-11-14 DIAGNOSIS — N952 Postmenopausal atrophic vaginitis: Secondary | ICD-10-CM | POA: Diagnosis not present

## 2023-11-14 DIAGNOSIS — H4089 Other specified glaucoma: Secondary | ICD-10-CM

## 2023-11-14 DIAGNOSIS — R42 Dizziness and giddiness: Secondary | ICD-10-CM

## 2023-11-14 DIAGNOSIS — R7303 Prediabetes: Secondary | ICD-10-CM | POA: Diagnosis not present

## 2023-11-14 DIAGNOSIS — Z23 Encounter for immunization: Secondary | ICD-10-CM | POA: Diagnosis not present

## 2023-11-14 DIAGNOSIS — E785 Hyperlipidemia, unspecified: Secondary | ICD-10-CM

## 2023-11-14 DIAGNOSIS — M810 Age-related osteoporosis without current pathological fracture: Secondary | ICD-10-CM | POA: Diagnosis not present

## 2023-11-14 LAB — LIPID PANEL
Cholesterol: 187 mg/dL (ref 0–200)
HDL: 75.7 mg/dL (ref >39.00)
LDL Cholesterol: 100 mg/dL — ABNORMAL HIGH (ref 0–99)
NonHDL: 111.23
Total CHOL/HDL Ratio: 2
Triglycerides: 58 mg/dL (ref 0.0–149.0)
VLDL: 11.6 mg/dL (ref 0.0–40.0)

## 2023-11-14 LAB — BASIC METABOLIC PANEL
BUN: 17 mg/dL (ref 6–23)
CO2: 30 meq/L (ref 19–32)
Calcium: 9.6 mg/dL (ref 8.4–10.5)
Chloride: 101 meq/L (ref 96–112)
Creatinine, Ser: 0.58 mg/dL (ref 0.40–1.20)
GFR: 86.33 mL/min (ref >60.00)
Glucose, Bld: 92 mg/dL (ref 70–99)
Potassium: 4.4 meq/L (ref 3.5–5.1)
Sodium: 139 meq/L (ref 135–145)

## 2023-11-14 LAB — HEMOGLOBIN A1C: Hgb A1c MFr Bld: 6 % (ref 4.6–6.5)

## 2023-11-14 MED ORDER — ATORVASTATIN CALCIUM 10 MG PO TABS
10.0000 mg | ORAL_TABLET | Freq: Every day | ORAL | 3 refills | Status: AC
Start: 2023-11-14 — End: ?

## 2023-11-14 NOTE — Assessment & Plan Note (Signed)
 Reviewed bone density scan from April 2024. Continue calcium, vitamin D, and Prolia injections semi-annually.

## 2023-11-14 NOTE — Patient Instructions (Signed)
 Stop by the lab prior to leaving today. I will notify you of your results once received.   It was a pleasure to see you today!

## 2023-11-14 NOTE — Assessment & Plan Note (Signed)
 Following ophthalmology.  Continue Alphagan 0.2% drops, Cosopt 2-0.5 percent drops.

## 2023-11-14 NOTE — Assessment & Plan Note (Signed)
 Repeat lipid panel pending.  Continue atorvastatin 10 mg daily.

## 2023-11-14 NOTE — Addendum Note (Signed)
 Addended by: Doreene Nest on: 11/14/2023 09:36 AM   Modules accepted: Orders

## 2023-11-14 NOTE — Progress Notes (Signed)
 Subjective:    Patient ID: Christy [name], female    DOB: [birth year], 78 y.o.   MRN: [id number]  HPI  Christy [name] is a very pleasant 78 y.o. female with a history of GERD, osteoporosis, glaucoma, hyperlipidemia, prediabetes who presents today for follow-up of chronic conditions.  Immunizations: -Tetanus: Completed in 2018 -Influenza: Completed last season -Shingles: Completed Shingrix series -Pneumonia: Completed Prevnar 13 in 2015, Pneumovax 23 in 2011.  Due for Prevnar 20 today.  Mammogram: Completed in February 2025 Bone Density Scan: Completed in April 2024  Colonoscopy: Completed in 2020, no further screening due to age.    1) Hyperlipidemia: Currently managed on atorvastatin 10 mg daily and fish oil 1200 mg daily.  She is due for repeat lipid panel today.  2) Osteoporosis: Currently managed on calcium and vitamin D daily, Prolia injection 60 mg semiannually. She is walking daily. Last bone density scan was in April 2024, improved to the left forearm and left femur.   3) Vaginal Atrophy: Currently managed on Estrace vaginal cream several times daily. Doing well on this regimen.   BP Readings from Last 3 Encounters:  11/14/23 136/80  04/18/23 138/88  12/22/22 (!) 176/88     Review of Systems  Respiratory:  Negative for shortness of breath.   Cardiovascular:  Negative for chest pain.  Gastrointestinal:  Negative for constipation and diarrhea.  Neurological:  Positive for dizziness.  Psychiatric/Behavioral:  The patient is not nervous/anxious.          Past Medical History:  Diagnosis Date   Arthritis    Atrophy of vagina    Cystocele    GERD (gastroesophageal reflux disease)    Glaucoma    left eye   Heart murmur    Mitral valve prolapse    Rectocele    Uveitis    left    Social History   Socioeconomic History   Marital status: Widowed    Spouse name: Not on file   Number of children: Not on file   Years of education: Not on file   Highest  education level: Bachelor's degree (e.g., BA, AB, BS)  Occupational History   Not on file  Tobacco Use   Smoking status: Never   Smokeless tobacco: Never  Vaping Use   Vaping status: Never Used  Substance and Sexual Activity   Alcohol use: Yes    Alcohol/week: 1.0 standard drink of alcohol    Types: 1 Glasses of wine per week    Comment: wine weekly   Drug use: No   Sexual activity: Not Currently    Birth control/protection: Surgical  Other Topics Concern   Not on file  Social History Narrative   Recently moved to [facility] from [place].   She is primary caregiver for her husband who has Alzheimers.   Two children.      Retired Metallurgist.      Desires CPR.   Has living will- SCANNED INTO CHART   Would not want prolonged life support if futile.                  Social Drivers of Corporate investment banker Strain: Low Risk  (10/25/2023)   Overall Financial Resource Strain (CARDIA)    Difficulty of Paying Living Expenses: Not hard at all  Food Insecurity: No Food Insecurity (10/25/2023)   Hunger Vital Sign    Worried About Running Out of Food in the Last Year: Never true  Ran Out of Food in the Last Year: Never true  Transportation Needs: No Transportation Needs (10/25/2023)   PRAPARE - Administrator, Civil Service (Medical): No    Lack of Transportation (Non-Medical): No  Physical Activity: Insufficiently Active (10/25/2023)   Exercise Vital Sign    Days of Exercise per Week: 7 days    Minutes of Exercise per Session: 20 min  Stress: No Stress Concern Present (10/25/2023)   [employer] of Occupational Health - Occupational Stress Questionnaire    Feeling of Stress : Not at all  Social Connections: Moderately Isolated (10/25/2023)   Social Connection and Isolation Panel [NHANES]    Frequency of Communication with Friends and Family: More than three times a week    Frequency of Social Gatherings with Friends and Family: Three times a week     Attends Religious Services: More than 4 times per year    Active Member of Clubs or Organizations: No    Attends Banker Meetings: Never    Marital Status: Widowed  Intimate Partner Violence: Not At Risk (10/25/2023)   Humiliation, Afraid, Rape, and Kick questionnaire    Fear of Current or Ex-Partner: No    Emotionally Abused: No    Physically Abused: No    Sexually Abused: No    Past Surgical History:  Procedure Laterality Date   ABDOMINAL HYSTERECTOMY     CATARACT EXTRACTION, BILATERAL     COLONOSCOPY WITH PROPOFOL N/A 02/12/2019   Procedure: COLONOSCOPY WITH PROPOFOL;  Surgeon: Midge Minium, MD;  Location: [facility] ENDOSCOPY;  Service: Endoscopy;  Laterality: N/A;   ESOPHAGOGASTRODUODENOSCOPY (EGD) WITH PROPOFOL N/A 02/12/2019   Procedure: ESOPHAGOGASTRODUODENOSCOPY (EGD) WITH PROPOFOL;  Surgeon: Midge Minium, MD;  Location: [facility] ENDOSCOPY;  Service: Endoscopy;  Laterality: N/A;   EYE SURGERY Bilateral    lens implant   TUBAL LIGATION     VAGINAL HYSTERECTOMY Bilateral 02/12/2018   Procedure: HYSTERECTOMY VAGINAL WITH BILATERAL SALPINGO OOPHERECTOMY;  Surgeon: Herold Harms, MD;  Location: [facility] ORS;  Service: Gynecology;  Laterality: Bilateral;    Family History  Problem Relation Age of Onset   Heart attack Mother    Aortic stenosis Mother    Heart disease Mother    Osteoporosis Mother    Heart attack Father    Heart failure Father    Cancer Father        prostate   Heart disease Father    AAA (abdominal aortic aneurysm) Father    Multiple sclerosis Daughter    Breast cancer Other     Allergies  Allergen Reactions   Xalatan [Latanoprost] Itching, Swelling and Other (See Comments)    Caused eye to be red, swollen,itchy    Current Outpatient Medications on File Prior to Visit  Medication Sig Dispense Refill   atorvastatin (LIPITOR) 10 MG tablet TAKE 1 TABLET BY MOUTH ONCE DAILY FOR CHOLESTEROL 90 tablet 3   brimonidine (ALPHAGAN) 0.2 % ophthalmic  solution Place 1 drop into the left eye 2 (two) times daily.      CALCIUM CARBONATE-VITAMIN D PO Take 1 tablet by mouth 2 (two) times daily.      cholecalciferol (VITAMIN D) 1000 UNITS tablet Take 1,000 Units by mouth daily.     Cranberry 500 MG CAPS Take 500 mg by mouth daily.      denosumab (PROLIA) 60 MG/ML SOLN injection Inject 60 mg into the skin every 6 (six) months. Administer in upper arm, thigh, or abdomen     dorzolamide-timolol (COSOPT) 22.3-6.8  MG/ML ophthalmic solution Place 1 drop into the left eye every 12 (twelve) hours.     estradiol (ESTRACE) 0.1 MG/GM vaginal cream INSERT ONE-HALF GRAM VAGINALLY TWICE WEEKLY 43 g 0   GLUCOSAMINE CHONDROITIN COMPLX PO Take 1 tablet by mouth daily.      hydrocortisone 2.5 % lotion Apply topically as directed. Apply to ears 3 nights per week Monday, Wednesday, and Fridays (Patient taking differently: Apply topically as needed. Apply to ears 3 nights per week Monday, Wednesday, and Fridays) 59 mL 6   Multiple Vitamin (MULTIVITAMIN) tablet Take 1 tablet by mouth daily.     Omega-3 Fatty Acids (FISH OIL) 1200 MG CAPS Take 2,400 mg by mouth daily.      triamcinolone cream (KENALOG) 0.1 % Apply twice daily to affected area on back up to 2 weeks as needed for rash/itching. Avoid applying to face, groin, and axilla. 30 g 2   Current Facility-Administered Medications on File Prior to Visit  Medication Dose Route Frequency Provider Last Rate Last Admin   denosumab (PROLIA) injection 60 mg  60 mg Subcutaneous Once Doreene Nest, NP        BP 136/80   Pulse 76   Temp (!) 97.5 F (36.4 C) (Temporal)   Ht 5\' 4"  (1.626 m)   Wt 123 lb (55.8 kg)   SpO2 100%   BMI 21.11 kg/m  Objective:   Physical Exam Cardiovascular:     Rate and Rhythm: Normal rate and regular rhythm.  Pulmonary:     Effort: Pulmonary effort is normal.     Breath sounds: Normal breath sounds.  Abdominal:     General: Bowel sounds are normal.     Palpations: Abdomen is soft.      Tenderness: There is no abdominal tenderness.  Musculoskeletal:     Cervical back: Neck supple.  Skin:    General: Skin is warm and dry.  Neurological:     Mental Status: She is alert and oriented to person, place, and time.  Psychiatric:        Mood and Affect: Mood normal.           Assessment & Plan:  Hyperlipidemia, unspecified hyperlipidemia type Assessment & Plan: Repeat lipid panel pending. Continue atorvastatin 10 mg daily.  Orders: -     Lipid panel -     Basic metabolic panel  Osteoporosis, unspecified osteoporosis type, unspecified pathological fracture presence Assessment & Plan: Reviewed bone density scan from April 2024. Continue calcium, vitamin D, and Prolia injections semi-annually.    Atrophy of vagina Assessment & Plan: Controlled.  Continue Estrace vaginal cream twice weekly.   Other glaucoma of right eye Assessment & Plan: Following ophthalmology.  Continue Alphagan 0.2% drops, Cosopt 2-0.5 percent drops.   Orthostatic dizziness Assessment & Plan: Improving with increased water and salt intake. Continue to monitor.   Prediabetes Assessment & Plan: Repeat A1c pending.  Orders: -     Hemoglobin A1c        Doreene Nest, NP

## 2023-11-14 NOTE — Assessment & Plan Note (Signed)
 Improving with increased water and salt intake. Continue to monitor.

## 2023-11-14 NOTE — Assessment & Plan Note (Signed)
 Repeat A1c pending

## 2023-11-14 NOTE — Assessment & Plan Note (Signed)
 Controlled.  Continue Estrace vaginal cream twice weekly.

## 2023-11-22 ENCOUNTER — Ambulatory Visit

## 2023-11-22 DIAGNOSIS — M810 Age-related osteoporosis without current pathological fracture: Secondary | ICD-10-CM | POA: Diagnosis not present

## 2023-11-22 MED ORDER — DENOSUMAB 60 MG/ML ~~LOC~~ SOSY
60.0000 mg | PREFILLED_SYRINGE | Freq: Once | SUBCUTANEOUS | Status: AC
Start: 2024-05-23 — End: 2024-07-11
  Administered 2024-07-11: 60 mg via SUBCUTANEOUS

## 2023-11-22 MED ORDER — DENOSUMAB 60 MG/ML ~~LOC~~ SOSY
60.0000 mg | PREFILLED_SYRINGE | Freq: Once | SUBCUTANEOUS | Status: AC
  Administered 2023-11-22: 60 mg via SUBCUTANEOUS

## 2023-11-22 NOTE — Progress Notes (Signed)
 Per orders of Mayra Reel, DPN AGNP-C,who is out of office and Mordecai Maes NP who is in office injection of prolia 60 mg Golden given by Lewanda Rife in right arm. Patient tolerated injection well. Patient will make appointment for 6 month.

## 2023-12-01 DIAGNOSIS — Z23 Encounter for immunization: Secondary | ICD-10-CM | POA: Diagnosis not present

## 2024-01-09 DIAGNOSIS — H401122 Primary open-angle glaucoma, left eye, moderate stage: Secondary | ICD-10-CM | POA: Diagnosis not present

## 2024-01-16 DIAGNOSIS — Z961 Presence of intraocular lens: Secondary | ICD-10-CM | POA: Diagnosis not present

## 2024-01-16 DIAGNOSIS — H40001 Preglaucoma, unspecified, right eye: Secondary | ICD-10-CM | POA: Diagnosis not present

## 2024-01-16 DIAGNOSIS — H3022 Posterior cyclitis, left eye: Secondary | ICD-10-CM | POA: Diagnosis not present

## 2024-01-16 DIAGNOSIS — H401122 Primary open-angle glaucoma, left eye, moderate stage: Secondary | ICD-10-CM | POA: Diagnosis not present

## 2024-01-30 ENCOUNTER — Other Ambulatory Visit: Payer: Self-pay | Admitting: Primary Care

## 2024-01-30 DIAGNOSIS — N952 Postmenopausal atrophic vaginitis: Secondary | ICD-10-CM

## 2024-04-12 ENCOUNTER — Ambulatory Visit (INDEPENDENT_AMBULATORY_CARE_PROVIDER_SITE_OTHER)
Admission: RE | Admit: 2024-04-12 | Discharge: 2024-04-12 | Disposition: A | Discharge status: Home / Self Care | Source: Ambulatory Visit | Attending: Family Medicine | Admitting: Family Medicine

## 2024-04-12 ENCOUNTER — Ambulatory Visit (INDEPENDENT_AMBULATORY_CARE_PROVIDER_SITE_OTHER): Admitting: Family Medicine

## 2024-04-12 ENCOUNTER — Ambulatory Visit: Payer: Self-pay | Admitting: Family Medicine

## 2024-04-12 VITALS — BP 132/74 | HR 84 | Temp 98.2°F | Ht 64.0 in | Wt 117.5 lb

## 2024-04-12 DIAGNOSIS — M545 Low back pain, unspecified: Secondary | ICD-10-CM | POA: Insufficient documentation

## 2024-04-12 DIAGNOSIS — M5441 Lumbago with sciatica, right side: Secondary | ICD-10-CM

## 2024-04-12 DIAGNOSIS — M79661 Pain in right lower leg: Secondary | ICD-10-CM

## 2024-04-12 DIAGNOSIS — M7918 Myalgia, other site: Secondary | ICD-10-CM | POA: Diagnosis not present

## 2024-04-12 DIAGNOSIS — M419 Scoliosis, unspecified: Secondary | ICD-10-CM | POA: Diagnosis not present

## 2024-04-12 DIAGNOSIS — M533 Sacrococcygeal disorders, not elsewhere classified: Secondary | ICD-10-CM | POA: Diagnosis not present

## 2024-04-12 DIAGNOSIS — M47816 Spondylosis without myelopathy or radiculopathy, lumbar region: Secondary | ICD-10-CM | POA: Diagnosis not present

## 2024-04-12 NOTE — Progress Notes (Signed)
 Subjective:    Patient ID: Christy [name], female    DOB: 1945/12/31, 78 y.o.   MRN: [id number]  HPI  Wt Readings from Last 3 Encounters:  04/12/24 117 lb 8 oz (53.3 kg)  11/14/23 123 lb (55.8 kg)  10/25/23 118 lb (53.5 kg)   20.17 kg/m  Vitals:   04/12/24 1217  BP: 132/74  Pulse: 84  Temp: 98.2 F (36.8 C)  SpO2: 97%    78 yo pf of NP Clark presents for c/o  Low back pain  Right leg and knee pain   Started August 12th  Had been working with some heavy pots prior but does not remember any pain with lifting  Grabbing pain -sharp No tingling or numbness  No sudden loss of bladder or bowel  Starts in right buttock - then travels to knee  No tenderness   Has incidental deg change in LS (when she gets dexa)    Worse in the am-has to hobble around and take pain medicine  Tries to do some stretches in bed   Gets worse as day goes on  The more she moves the better it gets   Uses ice for 20 minutes on buttock  Also heat (heating pad in her recliner)  Took some ibuprofen  - stopped it when she got diarrhea Took 2 tylenol  this am    History of osteoporosis with prolia  treatment   Lives in [place]  If PT she would to it there   A new mattress is on her agenda   Xrays  DG Lumbar Spine 2-3 Views Result Date: 04/12/2024 CLINICAL DATA:  pain in right low spine and SI joint area and upper right leg. Sharp and worse after inactivity. EXAM: SACRUM AND COCCYX - 2+ VIEW; LUMBAR SPINE - 2-3 VIEW COMPARISON:  None Available. FINDINGS: There are 5 nonrib-bearing lumbar vertebrae. Lumbar lordosis is maintained. There is mild levoscoliosis of the lumbar spine with apex at L3 level. No spondylolisthesis. Vertebral body heights are maintained. No aggressive osseous lesion. Mild multilevel degenerative changes in the form of facet arthropathy and marginal osteophyte formation. Normal and symmetric bilateral sacroiliac joints. Sacral arcuate lines are intact. Visualized soft  tissues are within normal limits. IMPRESSION: *No acute osseous abnormality of the lumbar spine or sacrum/coccyx. Mild multilevel degenerative changes. Electronically Signed   By: Ree Molt M.D.   On: 04/12/2024 13:18   DG Sacrum/Coccyx Result Date: 04/12/2024 CLINICAL DATA:  pain in right low spine and SI joint area and upper right leg. Sharp and worse after inactivity. EXAM: SACRUM AND COCCYX - 2+ VIEW; LUMBAR SPINE - 2-3 VIEW COMPARISON:  None Available. FINDINGS: There are 5 nonrib-bearing lumbar vertebrae. Lumbar lordosis is maintained. There is mild levoscoliosis of the lumbar spine with apex at L3 level. No spondylolisthesis. Vertebral body heights are maintained. No aggressive osseous lesion. Mild multilevel degenerative changes in the form of facet arthropathy and marginal osteophyte formation. Normal and symmetric bilateral sacroiliac joints. Sacral arcuate lines are intact. Visualized soft tissues are within normal limits. IMPRESSION: *No acute osseous abnormality of the lumbar spine or sacrum/coccyx. Mild multilevel degenerative changes. Electronically Signed   By: Ree Molt M.D.   On: 04/12/2024 13:18      Patient Active Problem List   Diagnosis Date Noted   Right buttock pain 04/12/2024   Pain of right lower leg 04/12/2024   Low back pain 04/12/2024   Orthostatic dizziness 04/18/2023   Other fatigue 11/09/2022   Chronic pain of  right hip 08/07/2020   Hyperlipidemia 07/10/2019   Prediabetes 07/10/2019   Healthcare maintenance 07/06/2018   Atrophy of vagina 07/04/2018   S/P TVHBSO with Anterior/Posterior Colporrhaphy 02/12/2018   Glaucoma    Uveitis    GERD (gastroesophageal reflux disease) 06/09/2015   Osteoporosis 09/02/2013   Encounter for Medicare annual wellness exam 05/06/2013   Arthritis    Seborrheic keratosis 01/26/1979   Past Medical History:  Diagnosis Date   Arthritis    Atrophy of vagina    Cataract    Removed   Cystocele    GERD  (gastroesophageal reflux disease)    Glaucoma    left eye   Heart murmur    Hyperlipidemia    Mitral valve prolapse    Osteoporosis    Osteoporosis   Rectocele    Uveitis    left   Past Surgical History:  Procedure Laterality Date   ABDOMINAL HYSTERECTOMY     CATARACT EXTRACTION, BILATERAL     COLONOSCOPY WITH PROPOFOL  N/A 02/12/2019   Procedure: COLONOSCOPY WITH PROPOFOL ;  Surgeon: Jinny Carmine, MD;  Location: [facility] ENDOSCOPY;  Service: Endoscopy;  Laterality: N/A;   ESOPHAGOGASTRODUODENOSCOPY (EGD) WITH PROPOFOL  N/A 02/12/2019   Procedure: ESOPHAGOGASTRODUODENOSCOPY (EGD) WITH PROPOFOL ;  Surgeon: Jinny Carmine, MD;  Location: [facility] ENDOSCOPY;  Service: Endoscopy;  Laterality: N/A;   EYE SURGERY Bilateral    lens implant   TUBAL LIGATION     VAGINAL HYSTERECTOMY Bilateral 02/12/2018   Procedure: HYSTERECTOMY VAGINAL WITH BILATERAL SALPINGO OOPHERECTOMY;  Surgeon: Kathe Gladis LABOR, MD;  Location: [facility] ORS;  Service: Gynecology;  Laterality: Bilateral;   Social History   Tobacco Use   Smoking status: Never   Smokeless tobacco: Never  Vaping Use   Vaping status: Never Used  Substance Use Topics   Alcohol use: Yes    Alcohol/week: 1.0 standard drink of alcohol    Types: 1 Glasses of wine per week    Comment: wine weekly   Drug use: No   Family History  Problem Relation Age of Onset   Heart attack Mother    Aortic stenosis Mother    Heart disease Mother    Osteoporosis Mother    Arthritis Mother    Hearing loss Mother    Hyperlipidemia Mother    Hypertension Mother    Heart attack Father    Heart failure Father    Cancer Father        prostate   Heart disease Father    AAA (abdominal aortic aneurysm) Father    Hyperlipidemia Father    Hypertension Father    Multiple sclerosis Daughter    Breast cancer Other    Heart disease Son    Heart disease Sister    Hypertension Sister    Intellectual disability Sister    Hyperlipidemia Sister    Allergies  Allergen  Reactions   Xalatan [Latanoprost] Itching, Swelling and Other (See Comments)    Caused eye to be red, swollen,itchy   Current Outpatient Medications on File Prior to Visit  Medication Sig Dispense Refill   atorvastatin  (LIPITOR) 10 MG tablet Take 1 tablet (10 mg total) by mouth daily. for cholesterol. 90 tablet 3   brimonidine (ALPHAGAN) 0.2 % ophthalmic solution Place 1 drop into the left eye 2 (two) times daily.      CALCIUM  CARBONATE-VITAMIN D  PO Take 1 tablet by mouth 2 (two) times daily.      cholecalciferol (VITAMIN D ) 1000 UNITS tablet Take 1,000 Units by mouth daily.  Cranberry 500 MG CAPS Take 500 mg by mouth daily.      denosumab  (PROLIA ) 60 MG/ML SOLN injection Inject 60 mg into the skin every 6 (six) months. Administer in upper arm, thigh, or abdomen     dorzolamide-timolol (COSOPT) 22.3-6.8 MG/ML ophthalmic solution Place 1 drop into the left eye every 12 (twelve) hours.     estradiol  (ESTRACE ) 0.1 MG/GM vaginal cream INSERT 0.5G VAGINALLY TWICE WEEKLY 43 g 0   GLUCOSAMINE CHONDROITIN COMPLX PO Take 1 tablet by mouth daily.      hydrocortisone  2.5 % lotion Apply topically as directed. Apply to ears 3 nights per week Monday, Wednesday, and Fridays (Patient taking differently: Apply topically as needed. Apply to ears 3 nights per week Monday, Wednesday, and Fridays) 59 mL 6   Multiple Vitamin (MULTIVITAMIN) tablet Take 1 tablet by mouth daily.     Omega-3 Fatty Acids (FISH OIL) 1200 MG CAPS Take 2,400 mg by mouth daily.      triamcinolone  cream (KENALOG ) 0.1 % Apply twice daily to affected area on back up to 2 weeks as needed for rash/itching. Avoid applying to face, groin, and axilla. 30 g 2   Current Facility-Administered Medications on File Prior to Visit  Medication Dose Route Frequency Provider Last Rate Last Admin   denosumab  (PROLIA ) injection 60 mg  60 mg Subcutaneous Once Clark, Katherine K, NP       [START ON 05/23/2024] denosumab  (PROLIA ) injection 60 mg  60 mg  Subcutaneous Once Wendee Lynwood HERO, NP        Review of Systems     Objective:   Physical Exam Constitutional:      General: She is not in acute distress.    Appearance: Normal appearance. She is normal weight. She is not ill-appearing or diaphoretic.  HENT:     Head: Normocephalic and atraumatic.  Cardiovascular:     Rate and Rhythm: Normal rate and regular rhythm.  Pulmonary:     Effort: Pulmonary effort is normal. No respiratory distress.  Musculoskeletal:     Comments: No midline spinal tenderness Some loss of lordosis  Some right SI joint area tenderness  Some right lumbar muscular tightness/tenderness   Pain with left lateral bend  Normal flex and ext  Normal rom hips (no leg or groin pain) Normal SLR bilat  No neuro deficits    Skin:    General: Skin is warm and dry.     Findings: No erythema or rash.  Neurological:     Mental Status: She is alert.     Sensory: No sensory deficit.     Motor: No weakness.     Gait: Gait normal.  Psychiatric:        Mood and Affect: Mood normal.           Assessment & Plan:   Problem List Items Addressed This Visit       Other   Right buttock pain - Primary   Relevant Orders   DG Lumbar Spine 2-3 Views (Completed)   DG Sacrum/Coccyx (Completed)   Ambulatory referral to Physical Therapy   Pain of right lower leg   Relevant Orders   DG Lumbar Spine 2-3 Views (Completed)   DG Sacrum/Coccyx (Completed)   Ambulatory referral to Physical Therapy   Low back pain   Pain that radiates from right lower back to buttock - to lateral right leg (to the knee) Much worse first thing in am and after inactivity  Warms up as day goes  on  History of right hip pain /chronic  History of deg spinal changes seen on dexa incidentally  Reassuring exam/no neuro deficits  Discussed likely sciatic nerve irritation from LS or SI joint  Xrays ordered  Tylenol /nsaid as tolerated Would like to avoid prednisone due to osteoporosis for now   Voltaren gel or pain patches prn Heat/ice   May consider PT Also given handout re: sciatica rehab   I personally spent a total of 32 minutes in the care of the patient today including preparing to see the patient, getting/reviewing separately obtained history, performing a medically appropriate exam/evaluation, counseling and educating, placing orders, referring and communicating with other health care professionals, and documenting clinical information in the EHR.        Relevant Orders   DG Lumbar Spine 2-3 Views (Completed)   DG Sacrum/Coccyx (Completed)   Ambulatory referral to Physical Therapy

## 2024-04-12 NOTE — Assessment & Plan Note (Addendum)
 Pain that radiates from right lower back to buttock - to lateral right leg (to the knee) Much worse first thing in am and after inactivity  Warms up as day goes on  History of right hip pain /chronic  History of deg spinal changes seen on dexa incidentally  Reassuring exam/no neuro deficits  Discussed likely sciatic nerve irritation from LS or SI joint  Xrays ordered  Tylenol /nsaid as tolerated Would like to avoid prednisone due to osteoporosis for now  Voltaren gel or pain patches prn Heat/ice   May consider PT Also given handout re: sciatica rehab   I personally spent a total of 32 minutes in the care of the patient today including preparing to see the patient, getting/reviewing separately obtained history, performing a medically appropriate exam/evaluation, counseling and educating, placing orders, referring and communicating with other health care professionals, and documenting clinical information in the EHR.

## 2024-04-12 NOTE — Patient Instructions (Addendum)
 Alternate heat and ice on the painful area  Tylenol  is fine   Ibuprofen  is ok if it does not upset your stomach   You can try voltaren gel 1% over the counter - on the painful area up to four times daily   You can try a pain patch over the counter also    Xray now  We will reach out with results  May consider physical therapy   Take a look at the sciatica handout as well   Do start shopping for a new mattress

## 2024-04-17 ENCOUNTER — Telehealth: Payer: Self-pay | Admitting: Primary Care

## 2024-04-17 NOTE — Telephone Encounter (Signed)
 Copied from CRM [id number]. Topic: Referral - Question >> Apr 17, 2024 10:02 AM Maisie C wrote: Reason for CRM:pt called and stated that Dr. Randeen was supposed to have a referral sent to [facility] for PT but she called them and they informed her that they haven't received anything. Please call and advise.

## 2024-04-18 NOTE — Telephone Encounter (Unsigned)
 Copied from CRM [id number]. Topic: Referral - Status >> Apr 18, 2024  8:54 AM Berneda FALCON wrote: Reason for CRM: I see that there was a referral sent for PT on 8/22 but [facility] is stating as of today they did not get the referral. Can we please resend this PT referral to [facility] for sciatica?  Fax [phone]

## 2024-04-19 ENCOUNTER — Telehealth: Payer: Self-pay | Admitting: Primary Care

## 2024-04-19 NOTE — Telephone Encounter (Signed)
 Referral has been refaxed. See referral notes for updates.

## 2024-04-19 NOTE — Telephone Encounter (Signed)
 Copied from CRM [id number]. Topic: Referral - Status >> Apr 19, 2024 10:34 AM Dedra NOVAK wrote: Reason for CRM: Jerel from [facility] called to follow up PT referral for pt. Pls call Jerel at [phone].

## 2024-04-19 NOTE — Telephone Encounter (Signed)
 Spoke with Christy [name] they have received PT referral.

## 2024-04-23 ENCOUNTER — Telehealth: Payer: Self-pay

## 2024-04-23 ENCOUNTER — Other Ambulatory Visit (HOSPITAL_COMMUNITY): Payer: Self-pay

## 2024-04-23 NOTE — Telephone Encounter (Signed)
 SABRA

## 2024-04-23 NOTE — Telephone Encounter (Signed)
 Prolia  VOB initiated via [email or web address]  Next Prolia  inj DUE: 05/23/24

## 2024-04-23 NOTE — Telephone Encounter (Signed)
 Pt ready for scheduling for PROLIA  on or after : 05/23/24  Option# 1: Buy/Bill (Office supplied medication)  Out-of-pocket cost due at time of clinic visit: $0  Number of injection/visits approved: ---  Primary: MEDICARE Prolia  co-insurance: 0% Admin fee co-insurance: 0%  Secondary: UHC-MEDSUP Prolia  co-insurance:  Admin fee co-insurance:   Medical Benefit Details: Date Benefits were checked: 04/23/24 Deductible: $257 Met of $257 Required/ Coinsurance: 0%/ Admin Fee: 0%  Prior Auth: N/A PA# Expiration Date:   # of doses approved: ----------------------------------------------------------------------- Option# 2- Med Obtained from pharmacy:  Pharmacy benefit: Copay $--- (Paid to pharmacy) Admin Fee: --- (Pay at clinic)  Prior Auth: --- PA# Expiration Date:   # of doses approved:   If patient wants fill through the pharmacy benefit please send prescription to: ---, and include estimated need by date in rx notes. Pharmacy will ship medication directly to the office.  Patient NOT eligible for Prolia  Copay Card. Copay Card can make patient's cost as little as $25. Link to apply: [email or web address]://[email or web address]  ** This summary of benefits is an estimation of the patient's out-of-pocket cost. Exact cost may very based on individual plan coverage.

## 2024-04-24 DIAGNOSIS — M6281 Muscle weakness (generalized): Secondary | ICD-10-CM | POA: Diagnosis not present

## 2024-04-24 DIAGNOSIS — M5441 Lumbago with sciatica, right side: Secondary | ICD-10-CM | POA: Diagnosis not present

## 2024-04-24 DIAGNOSIS — M25551 Pain in right hip: Secondary | ICD-10-CM | POA: Diagnosis not present

## 2024-04-24 DIAGNOSIS — M79661 Pain in right lower leg: Secondary | ICD-10-CM | POA: Diagnosis not present

## 2024-04-24 DIAGNOSIS — M7918 Myalgia, other site: Secondary | ICD-10-CM | POA: Diagnosis not present

## 2024-04-29 DIAGNOSIS — M5441 Lumbago with sciatica, right side: Secondary | ICD-10-CM | POA: Diagnosis not present

## 2024-04-29 DIAGNOSIS — M79661 Pain in right lower leg: Secondary | ICD-10-CM | POA: Diagnosis not present

## 2024-04-29 DIAGNOSIS — M7918 Myalgia, other site: Secondary | ICD-10-CM | POA: Diagnosis not present

## 2024-04-29 DIAGNOSIS — M25551 Pain in right hip: Secondary | ICD-10-CM | POA: Diagnosis not present

## 2024-04-29 DIAGNOSIS — M6281 Muscle weakness (generalized): Secondary | ICD-10-CM | POA: Diagnosis not present

## 2024-04-30 ENCOUNTER — Ambulatory Visit: Payer: Self-pay

## 2024-04-30 NOTE — Telephone Encounter (Signed)
 Called and spoke with patient, she is not having any new loss of bladder/bowel control. Scheduled patient appt on 9/12 with Mallie @ 2:20pm. ED precautions were given.

## 2024-04-30 NOTE — Telephone Encounter (Signed)
 Any new bowel/bladder control loss?  If not, please schedule patient for an office visit at her earliest convenience. If so, needs to go to the ED.

## 2024-04-30 NOTE — Telephone Encounter (Signed)
 FYI Only or Action Required?: Action required by provider: requests call back. Advised UC/ED if symptoms worsen.  Patient was last seen in primary care on 04/12/2024 by Randeen Laine LABOR, MD.  Called Nurse Triage reporting Leg Pain.  Symptoms began several weeks ago.  Interventions attempted: OTC medications: ibuprofen , tylenol , ice and heat, Physical Therapy.  Symptoms are: unchanged.  Triage Disposition: See PCP When Office is Open (Within 3 Days)  Patient/caregiver understands and will follow disposition?: Yes    Copied from CRM #[id number]. Topic: Clinical - Red Word Triage >> Apr 30, 2024 10:50 AM Carlyon D wrote: Red Word that prompted transfer to Nurse Triage: severe left leg pain front and inner thigh to the knee, pain in the right lower back/butt as well . Taking over counter ibuprofen  and tylenol  for pain. Does not help. Reason for Disposition  [1] MODERATE pain (e.g., interferes with normal activities, limping) AND [2] present > 3 days  Answer Assessment - Initial Assessment Questions Patient requesting call back for Provider's recommendations. Advised UC/ED if symptoms worsen.  Patient reports  Not sure if physical therapy is helping, the pain is the same. Physical therapist requests for patient to f/u with PCP. Pt not sure if she should wait to do more PT, to see if help with pain or if need another pain medication.  Used Ibuprofen , tylenol , ice and heat  Patient reports physical therapy tomorrow and available for afternoon or evening appts with PCP.   1. ONSET: When did the pain start?  04/02/24      LOV 04/12/24,  2. LOCATION: Where is the pain located?      Right buttocks, leg radiates to knee  3. PAIN: How bad is the pain?    (Scale 1-10; or mild, moderate, severe)     Currently not pain, sitting. Pain varies from time to time and I also took my pain medications. Walking feel tingling in my knee; if I keep walking, it intensify.   4. WORK OR EXERCISE: Has there  been any recent work or exercise that involved this part of the body?      Hurts with movement  5. CAUSE: What do you think is causing the leg pain?     Unsure  6. OTHER SYMPTOMS: Do you have any other symptoms? (e.g., chest pain, back pain, breathing difficulty, swelling, rash, fever, numbness, weakness)     No redness, swelling, numbness or weakness.  Protocols used: Leg Pain-A-AH

## 2024-04-30 NOTE — Telephone Encounter (Signed)
 Noted, will evaluate.

## 2024-05-01 DIAGNOSIS — M7918 Myalgia, other site: Secondary | ICD-10-CM | POA: Diagnosis not present

## 2024-05-01 DIAGNOSIS — M25551 Pain in right hip: Secondary | ICD-10-CM | POA: Diagnosis not present

## 2024-05-01 DIAGNOSIS — M6281 Muscle weakness (generalized): Secondary | ICD-10-CM | POA: Diagnosis not present

## 2024-05-01 DIAGNOSIS — M5441 Lumbago with sciatica, right side: Secondary | ICD-10-CM | POA: Diagnosis not present

## 2024-05-01 DIAGNOSIS — M79661 Pain in right lower leg: Secondary | ICD-10-CM | POA: Diagnosis not present

## 2024-05-03 ENCOUNTER — Ambulatory Visit (INDEPENDENT_AMBULATORY_CARE_PROVIDER_SITE_OTHER): Admitting: Primary Care

## 2024-05-03 ENCOUNTER — Encounter: Payer: Self-pay | Admitting: Primary Care

## 2024-05-03 VITALS — BP 170/96 | HR 73 | Temp 97.8°F | Ht 64.0 in | Wt 115.0 lb

## 2024-05-03 DIAGNOSIS — M5441 Lumbago with sciatica, right side: Secondary | ICD-10-CM | POA: Diagnosis not present

## 2024-05-03 MED ORDER — PREDNISONE 20 MG PO TABS: ORAL_TABLET | ORAL | 0 refills | Status: DC

## 2024-05-03 NOTE — Progress Notes (Signed)
 Subjective:    Patient ID: Christy [name], female    DOB: 11/18/1945, 78 y.o.   MRN: [id number]  Christy [name] is a very pleasant 78 y.o. female with a history of arthritis, osteoporosis, prediabetes, chronic right hip pain, right buttock pain, right lower extremity pain, chronic back pain who presents today to discuss lower extremity and buttock pain.  Evaluated by Dr. Norman on 04/10/2024 for a 9 day history of right lower back and buttock pain.  Symptoms were suspected to be secondary to sciatic nerve irritation from lumbar spine or sacroiliac joint.  She underwent plain films of the lumbar spine and sacrum which showed mild multilevel degenerative changes.  A referral was placed for physical therapy.  Today she discusses ongoing right buttock pain with radiation to the right groin and medial thigh. More recently she's noticed the pain to the medial calf. She thought her symptoms were improving until Labor day when taking a long walk. She began PT, has had 4 sessions without much improvement.    She has noticed right groin numbness. She denies loss of bowel/bladder control. Sitting in her recliner helps to reduce pain. She has to sleep on her left side with a pillow in between her legs. Her pain is worse when she walks, now has to use a rolater walker.   She's been taking Tylenol  with temporary improvement.     Review of Systems  Genitourinary:        No loss of bowel or bladder control  Musculoskeletal:  Positive for back pain.  Neurological:  Positive for numbness. Negative for weakness.         Past Medical History:  Diagnosis Date   Arthritis    Atrophy of vagina    Cataract    Removed   Cystocele    GERD (gastroesophageal reflux disease)    Glaucoma    left eye   Heart murmur    Hyperlipidemia    Mitral valve prolapse    Osteoporosis    Osteoporosis   Rectocele    Uveitis    left    Social History   Socioeconomic History   Marital status: Widowed     Spouse name: Not on file   Number of children: Not on file   Years of education: Not on file   Highest education level: Bachelor's degree (e.g., BA, AB, BS)  Occupational History   Not on file  Tobacco Use   Smoking status: Never   Smokeless tobacco: Never  Vaping Use   Vaping status: Never Used  Substance and Sexual Activity   Alcohol use: Yes    Alcohol/week: 1.0 standard drink of alcohol    Types: 1 Glasses of wine per week    Comment: wine weekly   Drug use: No   Sexual activity: Not Currently    Birth control/protection: Surgical  Other Topics Concern   Not on file  Social History Narrative   Recently moved to [facility] from [place].   She is primary caregiver for her husband who has Alzheimers.   Two children.      Retired Metallurgist.      Desires CPR.   Has living will- SCANNED INTO CHART   Would not want prolonged life support if futile.                  Social Drivers of Corporate investment banker Strain: Low Risk  (04/12/2024)   Overall Physicist, medical Strain (  CARDIA)    Difficulty of Paying Living Expenses: Not hard at all  Food Insecurity: No Food Insecurity (04/12/2024)   Hunger Vital Sign    Worried About Running Out of Food in the Last Year: Never true    Ran Out of Food in the Last Year: Never true  Transportation Needs: No Transportation Needs (04/12/2024)   PRAPARE - Administrator, Civil Service (Medical): No    Lack of Transportation (Non-Medical): No  Physical Activity: Sufficiently Active (04/12/2024)   Exercise Vital Sign    Days of Exercise per Week: 7 days    Minutes of Exercise per Session: 30 min  Stress: No Stress Concern Present (04/12/2024)   [employer] of Occupational Health - Occupational Stress Questionnaire    Feeling of Stress: Only a little  Social Connections: Moderately Isolated (04/12/2024)   Social Connection and Isolation Panel    Frequency of Communication with Friends and Family: More  than three times a week    Frequency of Social Gatherings with Friends and Family: Three times a week    Attends Religious Services: 1 to 4 times per year    Active Member of Clubs or Organizations: No    Attends Banker Meetings: Not on file    Marital Status: Widowed  Intimate Partner Violence: Not At Risk (10/25/2023)   Humiliation, Afraid, Rape, and Kick questionnaire    Fear of Current or Ex-Partner: No    Emotionally Abused: No    Physically Abused: No    Sexually Abused: No    Past Surgical History:  Procedure Laterality Date   ABDOMINAL HYSTERECTOMY     CATARACT EXTRACTION, BILATERAL     COLONOSCOPY WITH PROPOFOL  N/A 02/12/2019   Procedure: COLONOSCOPY WITH PROPOFOL ;  Surgeon: Jinny Carmine, MD;  Location: [facility] END[facility];  Service: Endoscopy;  Laterality: N/A;   ESOPHAGOGASTRODUODENOSCOPY (EGD) WITH PROPOFOL  N/A 02/12/2019   Procedure: ESOPHAGOGASTRODUODENOSCOPY (EGD) WITH PROPOFOL ;  Surgeon: Jinny Carmine, MD;  Location: [facility] ENDOSCOPY;  Service: Endoscopy;  Laterality: N/A;   EYE SURGERY Bilateral    lens implant   TUBAL LIGATION     VAGINAL HYSTERECTOMY Bilateral 02/12/2018   Procedure: HYSTERECTOMY VAGINAL WITH BILATERAL SALPINGO OOPHERECTOMY;  Surgeon: Kathe Gladis LABOR, MD;  Location: [facility] ORS;  Service: Gynecology;  Laterality: Bilateral;    Family History  Problem Relation Age of Onset   Heart attack Mother    Aortic stenosis Mother    Heart disease Mother    Osteoporosis Mother    Arthritis Mother    Hearing loss Mother    Hyperlipidemia Mother    Hypertension Mother    Heart attack Father    Heart failure Father    Cancer Father        prostate   Heart disease Father    AAA (abdominal aortic aneurysm) Father    Hyperlipidemia Father    Hypertension Father    Multiple sclerosis Daughter    Breast cancer Other    Heart disease Son    Heart disease Sister    Hypertension Sister    Intellectual disability Sister    Hyperlipidemia Sister      Allergies  Allergen Reactions   Xalatan [Latanoprost] Itching, Swelling and Other (See Comments)    Caused eye to be red, swollen,itchy    Current Outpatient Medications on File Prior to Visit  Medication Sig Dispense Refill   atorvastatin  (LIPITOR) 10 MG tablet Take 1 tablet (10 mg total) by mouth daily. for cholesterol. 90 tablet  3   brimonidine (ALPHAGAN) 0.2 % ophthalmic solution Place 1 drop into the left eye 2 (two) times daily.      CALCIUM  CARBONATE-VITAMIN D  PO Take 1 tablet by mouth 2 (two) times daily.      cholecalciferol (VITAMIN D ) 1000 UNITS tablet Take 1,000 Units by mouth daily.     Cranberry 500 MG CAPS Take 500 mg by mouth daily.      denosumab  (PROLIA ) 60 MG/ML SOLN injection Inject 60 mg into the skin every 6 (six) months. Administer in upper arm, thigh, or abdomen     dorzolamide-timolol (COSOPT) 22.3-6.8 MG/ML ophthalmic solution Place 1 drop into the left eye every 12 (twelve) hours.     estradiol  (ESTRACE ) 0.1 MG/GM vaginal cream INSERT 0.5G VAGINALLY TWICE WEEKLY 43 g 0   GLUCOSAMINE CHONDROITIN COMPLX PO Take 1 tablet by mouth daily.      hydrocortisone  2.5 % lotion Apply topically as directed. Apply to ears 3 nights per week Monday, Wednesday, and Fridays (Patient taking differently: Apply topically as needed. Apply to ears 3 nights per week Monday, Wednesday, and Fridays) 59 mL 6   Multiple Vitamin (MULTIVITAMIN) tablet Take 1 tablet by mouth daily.     Omega-3 Fatty Acids (FISH OIL) 1200 MG CAPS Take 2,400 mg by mouth daily.      triamcinolone  cream (KENALOG ) 0.1 % Apply twice daily to affected area on back up to 2 weeks as needed for rash/itching. Avoid applying to face, groin, and axilla. 30 g 2   Current Facility-Administered Medications on File Prior to Visit  Medication Dose Route Frequency Provider Last Rate Last Admin   denosumab  (PROLIA ) injection 60 mg  60 mg Subcutaneous Once Brigido Mera K, NP       [START ON 05/23/2024] denosumab  (PROLIA )  injection 60 mg  60 mg Subcutaneous Once Cable, James M, NP        BP (!) 170/96   Pulse 73   Temp 97.8 F (36.6 C) (Temporal)   Ht 5' 4 (1.626 m)   Wt 115 lb (52.2 kg)   SpO2 99%   BMI 19.74 kg/m  Objective:   Physical Exam Constitutional:      General: She is not in acute distress. Cardiovascular:     Rate and Rhythm: Normal rate.  Pulmonary:     Effort: Pulmonary effort is normal.  Musculoskeletal:     Cervical back: Neck supple.     Lumbar back: No bony tenderness. Decreased range of motion. Negative right straight leg raise test and negative left straight leg raise test.       Back:       Legs:  Skin:    General: Skin is warm and dry.  Neurological:     Mental Status: She is alert and oriented to person, place, and time.  Psychiatric:        Mood and Affect: Mood normal.     Physical Exam        Assessment & Plan:  Acute right-sided low back pain with right-sided sciatica Assessment & Plan: Exam and HPI representative of acute sciatica.  No alarm signs on exam.  Given ongoing pain despite physical therapy sessions, will proceed with steroid treatment.  Start prednisone  tablets. Take two tablets my mouth once daily in the morning for four days, then one tablet once daily in the morning for four days.  Toradol  60 mg provided intramuscularly today. Continue with physical therapy.  Proceed with MRI if no improvement by early next  week.  She will update. ED precautions provided.  Orders: -     predniSONE ; Take two tablets my mouth once daily in the morning for four days, then one tablet once daily in the morning for four days.  Dispense: 12 tablet; Refill: 0    Assessment and Plan Assessment & Plan        Comer MARLA Gaskins, NP   History of Present Illness

## 2024-05-03 NOTE — Patient Instructions (Signed)
 Start prednisone  tablets. Take two tablets my mouth once daily in the morning for four days, then one tablet once daily in the morning for four days.   Please update me early next week if there is no improvement.   It was a pleasure to see you today!

## 2024-05-03 NOTE — Assessment & Plan Note (Signed)
 Exam and HPI representative of acute sciatica.  No alarm signs on exam.  Given ongoing pain despite physical therapy sessions, will proceed with steroid treatment.  Start prednisone  tablets. Take two tablets my mouth once daily in the morning for four days, then one tablet once daily in the morning for four days.  Toradol  60 mg provided intramuscularly today. Continue with physical therapy.  Proceed with MRI if no improvement by early next week.  She will update. ED precautions provided.

## 2024-05-06 DIAGNOSIS — M79661 Pain in right lower leg: Secondary | ICD-10-CM | POA: Diagnosis not present

## 2024-05-06 DIAGNOSIS — M5441 Lumbago with sciatica, right side: Secondary | ICD-10-CM | POA: Diagnosis not present

## 2024-05-06 DIAGNOSIS — M25551 Pain in right hip: Secondary | ICD-10-CM | POA: Diagnosis not present

## 2024-05-06 DIAGNOSIS — M6281 Muscle weakness (generalized): Secondary | ICD-10-CM | POA: Diagnosis not present

## 2024-05-06 DIAGNOSIS — M7918 Myalgia, other site: Secondary | ICD-10-CM | POA: Diagnosis not present

## 2024-05-07 DIAGNOSIS — M5441 Lumbago with sciatica, right side: Secondary | ICD-10-CM

## 2024-05-07 DIAGNOSIS — M79661 Pain in right lower leg: Secondary | ICD-10-CM

## 2024-05-07 DIAGNOSIS — M7918 Myalgia, other site: Secondary | ICD-10-CM

## 2024-05-08 DIAGNOSIS — M25551 Pain in right hip: Secondary | ICD-10-CM | POA: Diagnosis not present

## 2024-05-08 DIAGNOSIS — M79661 Pain in right lower leg: Secondary | ICD-10-CM | POA: Diagnosis not present

## 2024-05-08 DIAGNOSIS — M5441 Lumbago with sciatica, right side: Secondary | ICD-10-CM | POA: Diagnosis not present

## 2024-05-08 DIAGNOSIS — M6281 Muscle weakness (generalized): Secondary | ICD-10-CM | POA: Diagnosis not present

## 2024-05-08 DIAGNOSIS — M7918 Myalgia, other site: Secondary | ICD-10-CM | POA: Diagnosis not present

## 2024-05-08 MED ORDER — KETOROLAC TROMETHAMINE 60 MG/2ML IM SOLN
60.0000 mg | Freq: Once | INTRAMUSCULAR | Status: AC
  Administered 2024-05-03: 60 mg via INTRAMUSCULAR

## 2024-05-08 NOTE — Addendum Note (Signed)
 Addended by: NARCISO ANDREZ BROCKS on: 05/08/2024 07:41 AM   Modules accepted: Orders

## 2024-05-09 ENCOUNTER — Ambulatory Visit
Admission: RE | Admit: 2024-05-09 | Discharge: 2024-05-09 | Disposition: A | Discharge status: Home / Self Care | Source: Ambulatory Visit | Attending: Primary Care | Admitting: Primary Care

## 2024-05-09 DIAGNOSIS — M7918 Myalgia, other site: Secondary | ICD-10-CM | POA: Diagnosis not present

## 2024-05-09 DIAGNOSIS — M79661 Pain in right lower leg: Secondary | ICD-10-CM | POA: Insufficient documentation

## 2024-05-09 DIAGNOSIS — M4807 Spinal stenosis, lumbosacral region: Secondary | ICD-10-CM | POA: Diagnosis not present

## 2024-05-09 DIAGNOSIS — M5441 Lumbago with sciatica, right side: Secondary | ICD-10-CM | POA: Diagnosis not present

## 2024-05-09 DIAGNOSIS — M5117 Intervertebral disc disorders with radiculopathy, lumbosacral region: Secondary | ICD-10-CM | POA: Diagnosis not present

## 2024-05-09 DIAGNOSIS — M5116 Intervertebral disc disorders with radiculopathy, lumbar region: Secondary | ICD-10-CM | POA: Diagnosis not present

## 2024-05-09 DIAGNOSIS — M48061 Spinal stenosis, lumbar region without neurogenic claudication: Secondary | ICD-10-CM | POA: Diagnosis not present

## 2024-05-09 NOTE — Telephone Encounter (Signed)
 Kelli, stat MR lumbar spine ordered for this patient. Can you send to the stat teams?

## 2024-05-09 NOTE — Telephone Encounter (Signed)
 STAT team notified.

## 2024-05-10 ENCOUNTER — Ambulatory Visit: Payer: Self-pay | Admitting: Primary Care

## 2024-05-10 DIAGNOSIS — M5441 Lumbago with sciatica, right side: Secondary | ICD-10-CM

## 2024-05-13 DIAGNOSIS — M7918 Myalgia, other site: Secondary | ICD-10-CM | POA: Diagnosis not present

## 2024-05-13 DIAGNOSIS — M79661 Pain in right lower leg: Secondary | ICD-10-CM | POA: Diagnosis not present

## 2024-05-13 DIAGNOSIS — M25551 Pain in right hip: Secondary | ICD-10-CM | POA: Diagnosis not present

## 2024-05-13 DIAGNOSIS — M5441 Lumbago with sciatica, right side: Secondary | ICD-10-CM | POA: Diagnosis not present

## 2024-05-13 DIAGNOSIS — M6281 Muscle weakness (generalized): Secondary | ICD-10-CM | POA: Diagnosis not present

## 2024-05-13 NOTE — Progress Notes (Unsigned)
 Referring Physician:  Gretta Comer POUR, NP [address] [place],  [place] [address]  Primary Physician:  Gretta Comer POUR, NP  History of Present Illness: 05/14/2024 Christy [name] is here today with a chief complaint of right low back pain that radiates into her right lower extremity to the inside of her groin and back of her knee.  She states that it has improved some since her initial onset in August, but she still has significant amount of pain especially in the mornings and this is affected her activities of daily living.  She is not able to walk like normal and is having difficulty getting around and doing the things that she would like.  She states she is not falling, but feels as though she has to walk very slow.  She does feel as though her right hip and top of her right leg is weak.  Denies any saddle anesthesia or incontinence of bowel or bladder.      Duration: August 2025 Weakness: none Timing: intermittent, worse in the morning Bowel/Bladder Dysfunction: none  Conservative measures:  Physical therapy: currently doing PT at [facility], she has done about 5-6 visits, not really helping Multimodal medical therapy including regular antiinflammatories: Prednisone , Ibuprofen  Injections: no epidural steroid injections  Past Surgery: no spinal surgeries  Christy [name] has no symptoms of cervical myelopathy.  The symptoms are causing a significant impact on the patient's life.   Review of Systems:  A 10 point review of systems is negative, except for the pertinent positives and negatives detailed in the HPI.  Past Medical History: Past Medical History:  Diagnosis Date   Arthritis    Atrophy of vagina    Cataract    Removed   Cystocele    GERD (gastroesophageal reflux disease)    Glaucoma    left eye   Heart murmur    Hyperlipidemia    Mitral valve prolapse    Osteoporosis    Osteoporosis   Rectocele    Uveitis    left    Past Surgical  History: Past Surgical History:  Procedure Laterality Date   ABDOMINAL HYSTERECTOMY     CATARACT EXTRACTION, BILATERAL     COLONOSCOPY WITH PROPOFOL  N/A 02/12/2019   Procedure: COLONOSCOPY WITH PROPOFOL ;  Surgeon: Jinny Carmine, MD;  Location: [facility] ENDOSCOPY;  Service: Endoscopy;  Laterality: N/A;   ESOPHAGOGASTRODUODENOSCOPY (EGD) WITH PROPOFOL  N/A 02/12/2019   Procedure: ESOPHAGOGASTRODUODENOSCOPY (EGD) WITH PROPOFOL ;  Surgeon: Jinny Carmine, MD;  Location: [facility] ENDOSCOPY;  Service: Endoscopy;  Laterality: N/A;   EYE SURGERY Bilateral    lens implant   TUBAL LIGATION     VAGINAL HYSTERECTOMY Bilateral 02/12/2018   Procedure: HYSTERECTOMY VAGINAL WITH BILATERAL SALPINGO OOPHERECTOMY;  Surgeon: Kathe Gladis LABOR, MD;  Location: [facility] ORS;  Service: Gynecology;  Laterality: Bilateral;    Allergies: Allergies as of 05/14/2024 - Review Complete 05/14/2024  Allergen Reaction Noted   Xalatan [latanoprost] Itching, Swelling, and Other (See Comments) 05/06/2013    Medications: Outpatient Encounter Medications as of 05/14/2024  Medication Sig   atorvastatin  (LIPITOR) 10 MG tablet Take 1 tablet (10 mg total) by mouth daily. for cholesterol.   brimonidine (ALPHAGAN) 0.2 % ophthalmic solution Place 1 drop into the left eye 2 (two) times daily.    CALCIUM  CARBONATE-VITAMIN D  PO Take 1 tablet by mouth 2 (two) times daily.    cholecalciferol (VITAMIN D ) 1000 UNITS tablet Take 1,000 Units by mouth daily.   Cranberry 500 MG CAPS Take 500  mg by mouth daily.    denosumab  (PROLIA ) 60 MG/ML SOLN injection Inject 60 mg into the skin every 6 (six) months. Administer in upper arm, thigh, or abdomen   dorzolamide-timolol (COSOPT) 22.3-6.8 MG/ML ophthalmic solution Place 1 drop into the left eye every 12 (twelve) hours.   estradiol  (ESTRACE ) 0.1 MG/GM vaginal cream INSERT 0.5G VAGINALLY TWICE WEEKLY   GLUCOSAMINE CHONDROITIN COMPLX PO Take 1 tablet by mouth daily.    hydrocortisone  2.5 % lotion Apply topically  as directed. Apply to ears 3 nights per week Monday, Wednesday, and Fridays (Patient taking differently: Apply topically as needed. Apply to ears 3 nights per week Monday, Wednesday, and Fridays)   Multiple Vitamin (MULTIVITAMIN) tablet Take 1 tablet by mouth daily.   Omega-3 Fatty Acids (FISH OIL) 1200 MG CAPS Take 2,400 mg by mouth daily.    predniSONE  (DELTASONE ) 20 MG tablet Take two tablets my mouth once daily in the morning for four days, then one tablet once daily in the morning for four days.   triamcinolone  cream (KENALOG ) 0.1 % Apply twice daily to affected area on back up to 2 weeks as needed for rash/itching. Avoid applying to face, groin, and axilla.   Facility-Administered Encounter Medications as of 05/14/2024  Medication   denosumab  (PROLIA ) injection 60 mg   [START ON 05/23/2024] denosumab  (PROLIA ) injection 60 mg    Social History: Social History   Tobacco Use   Smoking status: Never   Smokeless tobacco: Never  Vaping Use   Vaping status: Never Used  Substance Use Topics   Alcohol use: Yes    Alcohol/week: 1.0 standard drink of alcohol    Types: 1 Glasses of wine per week    Comment: wine weekly   Drug use: No    Family Medical History: Family History  Problem Relation Age of Onset   Heart attack Mother    Aortic stenosis Mother    Heart disease Mother    Osteoporosis Mother    Arthritis Mother    Hearing loss Mother    Hyperlipidemia Mother    Hypertension Mother    Heart attack Father    Heart failure Father    Cancer Father        prostate   Heart disease Father    AAA (abdominal aortic aneurysm) Father    Hyperlipidemia Father    Hypertension Father    Multiple sclerosis Daughter    Breast cancer Other    Heart disease Son    Heart disease Sister    Hypertension Sister    Intellectual disability Sister    Hyperlipidemia Sister     Physical Examination: @VITALWITHPAIN @  General: Patient is well developed, well nourished, calm, collected,  and in no apparent distress. Attention to examination is appropriate.  Psychiatric: Patient is non-anxious.  Head:  Pupils equal, round, and reactive to light.  ENT:  Oral mucosa appears well hydrated.  Neck:   Supple.    Respiratory: Patient is breathing without any difficulty.  Extremities: No edema.  Vascular: Palpable dorsal pedal pulses.  Skin:   On exposed skin, there are no abnormal skin lesions.  NEUROLOGICAL:     Awake, alert, oriented to person, place, and time.  Speech is clear and fluent. Fund of knowledge is appropriate.   Cranial Nerves: Pupils equal round and reactive to light.  Facial tone is symmetric.   ROM of spine: Some increased tenderness palpation of her right lumbar paraspinals.  Strength:  Side Iliopsoas Quads Hamstring PF DF EHL  R 4- 5 4+ 5 5 5   L 5 5 5 5 5 5    Reflexes are 2+ patella, trace left hamstring reflex absent right hamstring reflex.  1+ Achilles  Some decrease sensation to her anterior medial thigh of her right lower extremity Patient has some difficulty with tandem gait.  Medical Decision Making  Imaging: EXAM: MRI LUMBAR SPINE WITHOUT CONTRAST   TECHNIQUE: Multiplanar, multisequence MR imaging of the lumbar spine was performed. No intravenous contrast was administered.   COMPARISON:  Radiographs 04/12/2024   FINDINGS: Segmentation: There are five lumbar type vertebral bodies. The last full intervertebral disc space is labeled L5-S1. This correlates with the radiographs.   Alignment: Left convex lumbar scoliosis but normal overall alignment in the sagittal plane.   Vertebrae:  Normal marrow signal.  No bone lesions or fractures.   Conus medullaris and cauda equina: Conus extends to the T12-L1 level. Conus and cauda equina appear normal.   Paraspinal and other soft tissues: No significant paraspinal or retroperitoneal findings. Simple renal cysts. Prominent extra renal pelvis on the left side.   Disc levels:    T12-L1: Bilateral perineural cysts or dilated nerve root sheaths. No disc protrusions, spinal or foraminal stenosis.   L1-2: Mild asymmetric left-sided facet disease and ligamentum flavum thickening but no disc protrusions, spinal or foraminal stenosis.   L2-3: Diffuse bulging annulus, osteophytic ridging and facet disease contributing to mild spinal stenosis and moderate bilateral lateral recess stenosis, right greater than left. Mild left foraminal narrowing due to a broad-based foraminal/extraforaminal disc osteophyte complex.   L3-4: There is a large complex synovial cyst extruding into the spinal canal and exerting significant mass effect on the right side of the thecal sac. It also appears to contact the exiting right L3 nerve root medially and is significantly compressing the right L4 nerve root in the lateral recess.   L4-5: Degenerative disc disease and facet disease with mild to moderate bilateral lateral recess stenosis but no significant spinal stenosis. There is right foraminal narrowing due to a right-sided disc osteophyte complex.   L5-S1: Degenerative disc disease and facet disease but no disc protrusions, spinal or lateral recess stenosis. Mild left foraminal narrowing due to a left-sided disc osteophyte complex.   IMPRESSION: 1. Large (16 x 14 mm) right-sided complex synovial cyst extruding into the spinal canal at L3-4 and exerting significant mass effect on the right side of the thecal sac. It also appears to contact the exiting right L3 nerve root medially and is significantly compressing the right L4 nerve root in the lateral recess. 2. Mild spinal stenosis and moderate bilateral lateral recess stenosis, right greater than left at L2-3. Mild left foraminal narrowing due to a broad-based foraminal/extraforaminal disc osteophyte complex. 3. Mild to moderate bilateral lateral recess stenosis at L4-5. There is right foraminal narrowing due to a right-sided  disc osteophyte complex. 4. Mild left foraminal narrowing at L5-S1 due to a left-sided disc osteophyte complex.  I have personally reviewed the images and agree with the above interpretation.  Assessment and Plan: Ms. Knouff is a pleasant 78 y.o. female with acute lumbar back pain in her right side that radiates to her anterior and medial thigh of her right lower extremity.  She has a very large complex synovial cyst causing significant stenosis at L3-4 that is compressive in nature.  Her area of pain does fit this dermatomal pattern.  She is currently in physical therapy, but has significant weakness in her right lower extremity.  I plan  to discuss this further with surgeon, Dr. Penne Sharps.  Will likely need surgical intervention if patient would like to move forward with surgery.  In the meantime discussed pain control including gabapentin .  The risk and benefits of this medication were discussed with the patient and her son.  She would like to move forward with this to try to help and get some relief.  In addition, dynamic x-ray films of her lumbar spine were ordered to evaluate for listhesis.  Plan to be back in contact with the patient very shortly for more finalized plans.  Thank you for involving me in the care of this patient.     Lyle Decamp, PA-C Dept. of Neurosurgery

## 2024-05-14 ENCOUNTER — Ambulatory Visit: Admitting: Physician Assistant

## 2024-05-14 ENCOUNTER — Ambulatory Visit
Admission: RE | Admit: 2024-05-14 | Discharge: 2024-05-14 | Disposition: A | Discharge status: Home / Self Care | Attending: Physician Assistant | Admitting: Physician Assistant

## 2024-05-14 ENCOUNTER — Ambulatory Visit
Admission: RE | Admit: 2024-05-14 | Discharge: 2024-05-14 | Disposition: A | Discharge status: Home / Self Care | Source: Ambulatory Visit | Attending: Physician Assistant | Admitting: Physician Assistant

## 2024-05-14 VITALS — BP 146/90 | Ht 64.0 in | Wt 113.0 lb

## 2024-05-14 DIAGNOSIS — M48061 Spinal stenosis, lumbar region without neurogenic claudication: Secondary | ICD-10-CM

## 2024-05-14 DIAGNOSIS — M7138 Other bursal cyst, other site: Secondary | ICD-10-CM | POA: Insufficient documentation

## 2024-05-14 DIAGNOSIS — M47816 Spondylosis without myelopathy or radiculopathy, lumbar region: Secondary | ICD-10-CM | POA: Diagnosis not present

## 2024-05-14 MED ORDER — GABAPENTIN 100 MG PO CAPS: 100.0000 mg | ORAL_CAPSULE | Freq: Two times a day (BID) | ORAL | 1 refills | Status: DC

## 2024-05-15 ENCOUNTER — Telehealth: Payer: Self-pay | Admitting: *Deleted

## 2024-05-15 ENCOUNTER — Ambulatory Visit: Payer: Self-pay | Admitting: Neurosurgery

## 2024-05-15 ENCOUNTER — Telehealth (INDEPENDENT_AMBULATORY_CARE_PROVIDER_SITE_OTHER): Payer: Self-pay | Admitting: Neurosurgery

## 2024-05-15 ENCOUNTER — Other Ambulatory Visit: Payer: Self-pay

## 2024-05-15 ENCOUNTER — Telehealth: Payer: Self-pay

## 2024-05-15 DIAGNOSIS — M5416 Radiculopathy, lumbar region: Secondary | ICD-10-CM

## 2024-05-15 DIAGNOSIS — M7138 Other bursal cyst, other site: Secondary | ICD-10-CM | POA: Insufficient documentation

## 2024-05-15 DIAGNOSIS — R29898 Other symptoms and signs involving the musculoskeletal system: Secondary | ICD-10-CM

## 2024-05-15 DIAGNOSIS — M7918 Myalgia, other site: Secondary | ICD-10-CM | POA: Diagnosis not present

## 2024-05-15 DIAGNOSIS — Z01818 Encounter for other preprocedural examination: Secondary | ICD-10-CM

## 2024-05-15 DIAGNOSIS — M6281 Muscle weakness (generalized): Secondary | ICD-10-CM | POA: Diagnosis not present

## 2024-05-15 DIAGNOSIS — M79661 Pain in right lower leg: Secondary | ICD-10-CM

## 2024-05-15 DIAGNOSIS — M5441 Lumbago with sciatica, right side: Secondary | ICD-10-CM | POA: Diagnosis not present

## 2024-05-15 DIAGNOSIS — M25551 Pain in right hip: Secondary | ICD-10-CM | POA: Diagnosis not present

## 2024-05-15 NOTE — Telephone Encounter (Signed)
 I spoke with the patient. We discussed the information below.   Planned surgery: Right sided lumbar hemilaminectomy at L3-4 for resection of synovial cyst, open    Surgery date: 05/23/2024 at [facility]: [address], [place], [place] [address]) - you will find out your arrival time the business day before your surgery.   Pre-op appointment at [facility] Pre-admit Testing: you will receive a call with a date/time for this appointment. If you are scheduled for an in person appointment, Pre-admit Testing is located on the first floor of the [facility] building, [address], [address]. During this appointment, they will advise you which medications you can take the morning of surgery, and which medications you will need to hold for surgery. Labs (such as blood work, EKG) may be done at your pre-op appointment. You are not required to fast for these labs. Should you need to change your pre-op appointment, please call Pre-admit testing at [phone].      Surgical clearance: we will send a clearance form to [facility]. They may wish to see you in their office prior to signing the clearance form. If so, they may call you to schedule an appointment.      Common restrictions after spine surgery: No bending, lifting, or twisting ("BLT"). Avoid lifting objects heavier than 10 pounds for the first 6 weeks after surgery. Where possible, avoid household activities that involve lifting, bending, reaching, pushing, or pulling such as laundry, vacuuming, grocery shopping, and childcare. Try to arrange for help from friends and family for these activities while you heal. Do not drive while taking prescription pain medication. Weeks 6 through 12 after surgery: avoid lifting more than 25 pounds.     How to contact us :  If you have any questions/concerns before or after surgery, you can reach us  at [phone], or you can send a mychart message.  We can be reached by phone or mychart 8am-4pm, Monday-Friday.  *Please note: Calls after 4pm are forwarded to a third party answering service. Mychart messages are not routinely monitored during evenings, weekends, and holidays. Please call our office to contact the answering service for urgent concerns during non-business hours.    If you have FMLA/disability paperwork, please drop it off or fax it to [phone]   Appointments/FMLA & disability paperwork: [employer] Registered Nurse/Surgery scheduler: Maytte Jacot, RN Certified Medical Assistants: Don, CMA, Elenor, CMA, & Damien, CMA Physician Assistants: Lyle Decamp, PA-C, Edsel Goods, PA-C & Glade Boys, PA-C Surgeons: Penne Sharps, MD & Reeves Daisy, MD   [facility] PREADMIT TESTING VISIT and SURGERY INFORMATION SHEET   Now that surgery has been scheduled you can anticipate several phone calls from [facility] services. A pharmacy technician will call you to verify your current list of medications taken at home.               The Pre-Service Center will call to verify your insurance information and to give you billing estimates and information.             The Preadmit Testing Office will be calling to schedule a visit to obtain information for the anesthesia team and provide instructions on preparation for surgery.  What can you expect for the Preadmit Testing Visit: Appointments may be scheduled in-person or by telephone.  If a telephone visit is scheduled, you may be asked to come into the office to have lab tests or other studies performed.  This visit will not be completed any greater than 14 days prior to your surgery.  If your surgery has been scheduled for a future date, please do not be alarmed if we have not contacted you to schedule an appointment more than a month prior to the surgery date.    Please be prepared to provide the following information during this appointment:             -Personal medical history                                               -Medication and allergy list            -Any history of problems with anesthesia              -Recent lab work or diagnostic studies            -Please notify us  of any needs we should be aware of to provide the best care possible           -You will be provided with instructions on how to prepare for your surgery.    On The Day of Surgery:  You must have a driver to take you home after surgery, you will be asked not to drive for 24 hours following surgery.  Taxi, Gisele and non-medical transport will not be acceptable means of transportation unless you have a responsible individual who will be traveling with you.  Visitors in the surgical area:   2 people will be able to visit you in your room once your preparation for surgery has been completed. During surgery, your visitors will be asked to wait in the Surgery Waiting Area.  It is not a requirement for them to stay, if they prefer to leave and come back.  Your visitor(s) will be given an update once the surgery has been completed.  No visitors are allowed in the initial recovery room to respect patient privacy and safety.  Once you are more awake and transfer to the secondary recovery area, or are transferred to an inpatient room, visitors will again be able to see you.  To respect and protect your privacy: We will ask on the day of surgery who your driver will be and what the contact number for that individual will be. We will ask if it is okay to share information with this individual, or if there is an alternative individual that we, or the surgeon, should contact to provide updates and information. If family or friends come to the surgical information desk requesting information about you, who you have not listed with us , no information will be given.   It may be helpful to designate someone as the main contact who will be responsible for updating your other friends  and family.    PREADMIT TESTING OFFICE: [phone] SAME DAY SURGERY: [phone] We look forward to caring for you before and throughout the process of your surgery.

## 2024-05-15 NOTE — Telephone Encounter (Signed)
 I had a follow-up phone visit today with Ms. Martha.  She was at home and I was in the office.  She gave consent to go forward with a phone visit.  I was following up with her after she had seen Lyle Butters yesterday in clinic.  In summary she has been worked up for right lower extremity weakness and severe pain.  She was found to have a large compressive synovial cyst in her lumbar spine causing severe compression of her right sided nerve roots.  She had severe weakness in her right hip and was 4- out of 5 on formal grading and also had weakness in her right knee extension.  She has positive radicular signs into the L3 and L4 distribution.  She had decreased reflexes on that side as well.  Her MRI shows a massive right sided synovial cyst with severe compression of the nearby and traversing nerve roots.  This is clearly correlated with her symptomatology.  Given the severity of her weakness I feel she would benefit from a lumbar decompression exploration and resection of a synovial cyst.  We discussed the risks and benefits of surgery including but not limited to CSF leak, nerve injury, failure to improve, need for further surgery, need for stabilization.  Given her weakness and progressive deficits she would like to go forward with the decompression and cyst resection.  We will plan on finding time for her in the operating room schedule to perform the lumbar laminectomy for synovial cyst resection.  Penne MICAEL Sharps, MD [facility] neurosurgery [place]

## 2024-05-15 NOTE — Telephone Encounter (Signed)
   Pre-operative Risk Assessment    Patient Name: Christy [name]  DOB: 1945/11/23 MRN: [id number]   Date of last office visit: 12/22/22 DR. ARIDA Date of next office visit: NONE   Request for Surgical Clearance    Procedure:  Right sided lumbar hemilaminectomy at L3-4 for resection of synovial cyst,OPEN  Date of Surgery:  Clearance 05/23/24                                Surgeon:  DR. PENNE SHARPS Surgeon's Group or Practice Name:  [facility] Phone number:  [phone]  Fax number:  [phone]   Type of Clearance Requested:   - Medical ; NONE INDICATED ON FORM TO BE HELD   Type of Anesthesia:  General    Additional requests/questions:    Bonney Niels Jest   05/15/2024, 3:50 PM   Letter  Jean, Kendelyn, RN on 05/15/2024     [facility] at [facility] [address] [place], [place]  [address] Phone:  [phone]   Fax:  [phone]   Patient: Christy [name]               DOB: 1946/03/19   Date sent: 05/15/2024              Faxed to: Davene Nicolas   Surgeon: PENNE SHARPS, MD    Date of procedure: 05/23/2024   Planned procedure & Anesthesia Type: Right sided lumbar hemilaminectomy at L3-4 for resection of synovial cyst, open/general anesthesia     Should you choose to see this patient in your office to provide clearance, please ask your office staff to contact the patient directly.  Please fax your evaluation and any supporting documentation as soon as completed.  Thank you! Your assistance is greatly appreciated!       Is this patient optimized to have the above procedure?   Yes   or   No   If NO, please indicate further studies/evaluations recommended     Risk:    Low    Moderate    High     Remarks:        Physician Name: _______________________________        Physician Signature: _______________________________ Date:________________    [facility] Neurosurgery at [facility], [phone]                                 1

## 2024-05-15 NOTE — Telephone Encounter (Signed)
   Name: Christy [name]  DOB: June 26, 1946  MRN: [id number]  Primary Cardiologist: Darron  Chart reviewed as part of pre-operative protocol coverage. Because of [name]'s past medical history and time since last visit, she will require a follow-up in-office visit in order to better assess preoperative cardiovascular risk. Has not been seen since 12/2022.   Pre-op covering staff: - Please schedule appointment and call patient to inform them. If patient already had an upcoming appointment within acceptable timeframe, please add pre-op clearance to the appointment notes so provider is aware. - Please contact requesting surgeon's office via preferred method (i.e, phone, fax) to inform them of need for appointment prior to surgery.  Patient is not on anticoagulation or antiplatelet per review of medical record in Epic.    Lamarr Satterfield, NP  05/15/2024, 4:04 PM

## 2024-05-15 NOTE — Telephone Encounter (Signed)
 Patient is needing a IN OFFICE VISIT for clearance sent a message to our [place] scheduling team to help find an appointment for patient

## 2024-05-16 ENCOUNTER — Ambulatory Visit: Attending: Nurse Practitioner | Admitting: Nurse Practitioner

## 2024-05-16 ENCOUNTER — Encounter: Payer: Self-pay | Admitting: Nurse Practitioner

## 2024-05-16 VITALS — BP 140/86 | HR 96 | Ht 64.0 in | Wt 115.2 lb

## 2024-05-16 DIAGNOSIS — I471 Supraventricular tachycardia, unspecified: Secondary | ICD-10-CM | POA: Diagnosis not present

## 2024-05-16 DIAGNOSIS — R000 Tachycardia, unspecified: Secondary | ICD-10-CM | POA: Insufficient documentation

## 2024-05-16 DIAGNOSIS — R030 Elevated blood-pressure reading, without diagnosis of hypertension: Secondary | ICD-10-CM | POA: Insufficient documentation

## 2024-05-16 DIAGNOSIS — Z01810 Encounter for preprocedural cardiovascular examination: Secondary | ICD-10-CM | POA: Diagnosis not present

## 2024-05-16 NOTE — Progress Notes (Signed)
 Office Visit    Patient Name: Christy [name] Date of Encounter: 05/16/2024  Primary Care Provider:  Gretta Comer POUR, NP Primary Cardiologist:  Christy Cage, MD  Cardiology APP:  Christy Lonni Ingle, NP   Chief Complaint    78 y.o. female with a history of of atypical chest pain, mitral valve prolapse, PSVT, palpitations, whitecoat hypertension, GERD, osteoporosis, prediabetes, and hyperlipidemia, presents for preoperative evaluation.  Past Medical History   Subjective   Past Medical History:  Diagnosis Date   Arthritis    Atrophy of vagina    Atypical chest pain    a. 2000 ETT [facility]): Abnl w/ ST/T changes; b. 2000 Stress Echo [facility]): Normal.   Cataract    Removed   Cystocele    GERD (gastroesophageal reflux disease)    Glaucoma    left eye   Heart murmur    Hyperlipidemia    Mitral valve prolapse    a. 2000 Stress echo [facility]): MVP; b. 01/2023 Echo: EF 55-60%, no rwma, nl RV fxn, mildly-mod dil LA, normal MV structure/fxn. Triv AI.   Osteoporosis    Osteoporosis   PSVT (paroxysmal supraventricular tachycardia)    a. 11/2022 Zio: Predominantly sinus rhythm, average 73 (48-1 37).  77 nonsustained SVT runs, longest 13.1 seconds at average to 117.  Strips suggest atrial tachycardia.  Rare PACs/PVCs.  No A-fib, sustained arrhythmias, or pauses.   Rectocele    Uveitis    left   White coat syndrome without diagnosis of hypertension    Past Surgical History:  Procedure Laterality Date   ABDOMINAL HYSTERECTOMY     CATARACT EXTRACTION, BILATERAL     COLONOSCOPY WITH PROPOFOL  N/A 02/12/2019   Procedure: COLONOSCOPY WITH PROPOFOL ;  Surgeon: Christy Carmine, MD;  Location: [facility] ENDOSCOPY;  Service: Endoscopy;  Laterality: N/A;   ESOPHAGOGASTRODUODENOSCOPY (EGD) WITH PROPOFOL  N/A 02/12/2019   Procedure: ESOPHAGOGASTRODUODENOSCOPY (EGD) WITH PROPOFOL ;  Surgeon: Christy Carmine, MD;  Location: [facility] ENDOSCOPY;  Service: Endoscopy;  Laterality: N/A;   EYE SURGERY Bilateral     lens implant   TUBAL LIGATION     VAGINAL HYSTERECTOMY Bilateral 02/12/2018   Procedure: HYSTERECTOMY VAGINAL WITH BILATERAL SALPINGO OOPHERECTOMY;  Surgeon: Christy Gladis LABOR, MD;  Location: [facility] ORS;  Service: Gynecology;  Laterality: Bilateral;    Allergies  Allergies  Allergen Reactions   Xalatan [Latanoprost] Itching, Swelling and Other (See Comments)    Caused eye to be red, swollen,itchy       History of Present Illness      78 y.o. y/o female with a history of of atypical chest pain, PSVT, mitral valve prolapse, palpitations, whitecoat hypertension, GERD, osteoporosis, prediabetes, and hyperlipidemia.  Patient previously underwent evaluation for atypical chest pain at [facility] in the year 2000, which time a treadmill stress test was positive for ischemia.  This was followed by treadmill echo, which was negative for ischemia.  Echo images showed normal LV systolic function with mild mitral valve prolapse.  In early 2024, she began experiencing palpitations.  She wore a 2-week ZIO monitor through her primary care provider's office which showed predominantly sinus rhythm with rare PACs.  She was also noted to have short runs of SVT, the longest lasting 13 seconds with an average rate of 117 bpm.  She established care with Dr. Cage in May 2024, and echocardiogram was performed in June 2024 showed an EF of 55-60% without regional wall motion abnormalities, normal RV size and function, mildly to moderately dilated left atrium, normal mitral valve structure  and function, and trivial AI.   Over the past 18 months, Christy [name] had been doing reasonably well from a cardiac standpoint.  She was walking a mile a day without symptoms or limitations, and was also very active in her garden.  In early August, 1 day after being very active in her garden, Christy [name] developed severe, sharp right leg pain, which she thought was sciatica.  She saw her primary care provider and the physical therapy was  ordered.  She started noticing some improvement in leg pain and over [name] Day weekend, she again went on her walk and was active in her garden for the first time in several weeks.  The following day, she had returned of sharp right leg and knee pain and she subsequently underwent MRI of the spine, which showed a large complex synovial cyst causing significant stenosis at L3-4 which is compressive in nature.  She was seen by neurosurgery on September 23 with recommendation for surgical decompression and cyst resection, and cardiology clearance has been requested.  As outlined above, Christy [name] does not experience chest pain or dyspnea.  She denies palpitations, PND, orthopnea, dizziness, syncope, edema, or early satiety.  Prior to developing leg pain, in addition to walking guarding, she feels that she could easily walk multiple flights of steps and perform strenuous housework such as moving furniture and scrubbing floors. Objective   Home Medications    Current Outpatient Medications  Medication Sig Dispense Refill   atorvastatin  (LIPITOR) 10 MG tablet Take 1 tablet (10 mg total) by mouth daily. for cholesterol. 90 tablet 3   brimonidine (ALPHAGAN) 0.2 % ophthalmic solution Place 1 drop into the left eye 2 (two) times daily.      CALCIUM  CARBONATE-VITAMIN D  PO Take 1 tablet by mouth 2 (two) times daily.      cholecalciferol (VITAMIN D ) 1000 UNITS tablet Take 1,000 Units by mouth daily.     Cranberry 500 MG CAPS Take 500 mg by mouth daily.      denosumab  (PROLIA ) 60 MG/ML SOLN injection Inject 60 mg into the skin every 6 (six) months. Administer in upper arm, thigh, or abdomen     dorzolamide-timolol (COSOPT) 22.3-6.8 MG/ML ophthalmic solution Place 1 drop into the left eye every 12 (twelve) hours.     estradiol  (ESTRACE ) 0.1 MG/GM vaginal cream INSERT 0.5G VAGINALLY TWICE WEEKLY 43 g 0   gabapentin  (NEURONTIN ) 100 MG capsule Take 1 capsule (100 mg total) by mouth 2 (two) times daily. 60 capsule  1   GLUCOSAMINE CHONDROITIN COMPLX PO Take 1 tablet by mouth daily.      hydrocortisone  2.5 % lotion Apply topically as directed. Apply to ears 3 nights per week Monday, Wednesday, and Fridays 59 mL 6   Multiple Vitamin (MULTIVITAMIN) tablet Take 1 tablet by mouth daily.     Omega-3 Fatty Acids (FISH OIL) 1200 MG CAPS Take 2,400 mg by mouth daily.      triamcinolone  cream (KENALOG ) 0.1 % Apply twice daily to affected area on back up to 2 weeks as needed for rash/itching. Avoid applying to face, groin, and axilla. 30 g 2   Current Facility-Administered Medications  Medication Dose Route Frequency Provider Last Rate Last Admin   denosumab  (PROLIA ) injection 60 mg  60 mg Subcutaneous Once Clark, Katherine K, NP       [START ON 05/23/2024] denosumab  (PROLIA ) injection 60 mg  60 mg Subcutaneous Once Wendee Lynwood HERO, NP         Physical Exam  VS:  BP (!) 140/86 (BP Location: Left Arm, Patient Position: Sitting, Cuff Size: Normal)   Pulse 96   Ht 5' 4 (1.626 m)   Wt 115 lb 4 oz (52.3 kg)   SpO2 97%   BMI 19.78 kg/m  , BMI Body mass index is 19.78 kg/m.          GEN: Well nourished, well developed, in no acute distress. HEENT: normal. Neck: Supple, no JVD, carotid bruits, or masses. Cardiac: RRR, no murmurs, rubs, or gallops. No clubbing, cyanosis, edema.  Radials 2+/PT 2+ and equal bilaterally.  Respiratory:  Respirations regular and unlabored, clear to auscultation bilaterally. GI: Soft, nontender, nondistended, BS + x 4. MS: no deformity or atrophy. Skin: warm and dry, no rash. Neuro:  Strength and sensation are intact. Psych: Normal affect.  Accessory Clinical Findings    ECG personally reviewed by me today - EKG Interpretation Date/Time:  Thursday May 16 2024 14:00:04 EDT Ventricular Rate:  108 PR Interval:  152 QRS Duration:  66 QT Interval:  336 QTC Calculation: 450 R Axis:   90  Text Interpretation: Sinus tachycardia Biatrial enlargement Rightward axis Anterior  infarct , age undetermined Confirmed by Christy Bruckner [phone]) on 05/16/2024 2:03:29 PM   - no acute changes.  Lab Results  Component Value Date   WBC 5.9 04/18/2023   HGB 14.2 04/18/2023   HCT 44.1 04/18/2023   MCV 98.6 04/18/2023   PLT 198.0 04/18/2023   Lab Results  Component Value Date   CREATININE 0.58 11/14/2023   BUN 17 11/14/2023   NA 139 11/14/2023   K 4.4 11/14/2023   CL 101 11/14/2023   CO2 30 11/14/2023   Lab Results  Component Value Date   ALT 24 04/18/2023   AST 27 04/18/2023   ALKPHOS 34 (L) 04/18/2023   BILITOT 0.7 04/18/2023   Lab Results  Component Value Date   CHOL 187 11/14/2023   HDL 75.70 11/14/2023   LDLCALC 100 (H) 11/14/2023   TRIG 58.0 11/14/2023   CHOLHDL 2 11/14/2023    Lab Results  Component Value Date   HGBA1C 6.0 11/14/2023   Lab Results  Component Value Date   TSH 0.93 04/18/2023       Assessment & Plan    1.  Preoperative cardiovascular examination: Patient with recent finding of complex synovial cyst causing significant stenosis at L3-4, which is compressive in nature and causing significant R leg pain.  She has been seen by neurosurgery with recommendation for decompression and cyst resection.  Prior to developing right leg pain associated with her cyst, she was walking a mile daily and also was very active in her garden until mid August.  She has not experienced chest pain or dyspnea.  Peak achievable METS of 7.25.  RCRI calculates to 0.5% risk of major cardiac event in the perioperative period.  In the setting of low preop risk, no additional ischemic evaluation is warranted at this time and she may proceed to the operating room without further testing.  I did recommend that she continue statin therapy throughout the perioperative period.  2.  PSVT: Monitoring in 2024 showed predominantly sinus rhythm with 77 brief runs of SVT lasting up to 13 seconds with an average rate of 117 bpm.  None of these were symptomatic.  Follow-up  echo at that time showed normal LV function with mildly to moderately dilated left atrium and no significant valvular disease.  She denies any palpitations today.  Reserve beta-blocker therapy for the  development of symptoms.  3.  White coat syndrome: Blood pressure elevated today at 140/86.  At home, she typically runs around 110 and she reports a history of orthostasis and lightheadedness, which improved when she improved her fluid intake and started liberalizing salt.  We agreed to have her continue follow-up with pressures at home.  4.  Sinus tachycardia: Heart rate elevated today in the setting of leg pain and anxiety.  Upon reevaluation of heart rate after several minutes of rest, rate was 96.  ECG otherwise unchanged from prior evaluations, with biatrial enlargement and septal infarct. No chest pain, dyspnea, or hypoxia.  She is due for lab work as part of preop eval early next week and will defer today.  5.  Hyperlipidemia:  On statin w/ LDL of 100 earlier this year.  6.  Prediabetes:  A1c of 6.0 earlier this year.  Per IM.  7.  Disposition: Follow-up in 1 year or sooner if necessary.  Lonni Meager, NP 05/16/2024, 2:48 PM

## 2024-05-16 NOTE — Patient Instructions (Addendum)
 Medication Instructions:  Your physician recommends that you continue on your current medications as directed. Please refer to the Current Medication list given to you today.  *If you need a refill on your cardiac medications before your next appointment, please call your pharmacy*  Lab Work: No labs ordered today  If you have labs (blood work) drawn today and your tests are completely normal, you will receive your results only by: MyChart Message (if you have MyChart) OR A paper copy in the mail If you have any lab test that is abnormal or we need to change your treatment, we will call you to review the results.  Testing/Procedures: No test ordered today   Follow-Up: At [facility], you and your health needs are our priority.  As part of our continuing mission to provide you with exceptional heart care, our providers are all part of one team.  This team includes your primary Cardiologist (physician) and Advanced Practice Providers or APPs (Physician Assistants and Nurse Practitioners) who all work together to provide you with the care you need, when you need it.  Your next appointment:   As needed or symptoms develop  Provider:  Deatrice Cage, MD    We recommend signing up for the patient portal called MyChart.  Sign up information is provided on this After Visit Summary.  MyChart is used to connect with patients for Virtual Visits (Telemedicine).  Patients are able to view lab/test results, encounter notes, upcoming appointments, etc.  Non-urgent messages can be sent to your provider as well.   To learn more about what you can do with MyChart, go to [email or web address].

## 2024-05-16 NOTE — Telephone Encounter (Signed)
 Appointment scheduled for today 05/16/2024 @ 1:55 with C. Vivienne in [place]. Procedure date is 05/23/2024

## 2024-05-17 ENCOUNTER — Other Ambulatory Visit: Payer: Self-pay

## 2024-05-17 ENCOUNTER — Encounter
Admission: RE | Admit: 2024-05-17 | Discharge: 2024-05-17 | Disposition: A | Discharge status: Home / Self Care | Source: Ambulatory Visit | Attending: Neurosurgery | Admitting: Neurosurgery

## 2024-05-17 ENCOUNTER — Telehealth: Payer: Self-pay

## 2024-05-17 VITALS — BP 141/89 | HR 110 | Resp 12 | Ht 64.0 in | Wt 114.7 lb

## 2024-05-17 DIAGNOSIS — Z01818 Encounter for other preprocedural examination: Secondary | ICD-10-CM | POA: Diagnosis present

## 2024-05-17 DIAGNOSIS — R011 Cardiac murmur, unspecified: Secondary | ICD-10-CM | POA: Insufficient documentation

## 2024-05-17 DIAGNOSIS — Z01812 Encounter for preprocedural laboratory examination: Secondary | ICD-10-CM | POA: Diagnosis not present

## 2024-05-17 DIAGNOSIS — I471 Supraventricular tachycardia, unspecified: Secondary | ICD-10-CM | POA: Diagnosis not present

## 2024-05-17 DIAGNOSIS — M5416 Radiculopathy, lumbar region: Secondary | ICD-10-CM | POA: Diagnosis not present

## 2024-05-17 DIAGNOSIS — E785 Hyperlipidemia, unspecified: Secondary | ICD-10-CM | POA: Diagnosis not present

## 2024-05-17 DIAGNOSIS — I341 Nonrheumatic mitral (valve) prolapse: Secondary | ICD-10-CM | POA: Insufficient documentation

## 2024-05-17 HISTORY — DX: Other bursal cyst, other site: M71.38

## 2024-05-17 HISTORY — DX: Radiculopathy, lumbar region: M54.16

## 2024-05-17 HISTORY — DX: Prediabetes: R73.03

## 2024-05-17 LAB — CBC
HCT: 41.4 % (ref 36.0–46.0)
Hemoglobin: 14.1 g/dL (ref 12.0–15.0)
MCH: 33.2 pg (ref 26.0–34.0)
MCHC: 34.1 g/dL (ref 30.0–36.0)
MCV: 97.4 fL (ref 80.0–100.0)
Platelets: 233 K/uL (ref 150–400)
RBC: 4.25 MIL/uL (ref 3.87–5.11)
RDW: 13.6 % (ref 11.5–15.5)
WBC: 8.7 K/uL (ref 4.0–10.5)
nRBC: 0 % (ref 0.0–0.2)

## 2024-05-17 LAB — BASIC METABOLIC PANEL WITH GFR
Anion gap: 12 (ref 5–15)
BUN: 30 mg/dL — ABNORMAL HIGH (ref 8–23)
CO2: 25 mmol/L (ref 22–32)
Calcium: 9.1 mg/dL (ref 8.9–10.3)
Chloride: 102 mmol/L (ref 98–111)
Creatinine, Ser: 0.53 mg/dL (ref 0.44–1.00)
GFR, Estimated: >60 mL/min (ref >60)
Glucose, Bld: 106 mg/dL — ABNORMAL HIGH (ref 70–99)
Potassium: 3.9 mmol/L (ref 3.5–5.1)
Sodium: 139 mmol/L (ref 135–145)

## 2024-05-17 LAB — TYPE AND SCREEN
ABO/RH(D): A POS
Antibody Screen: NEGATIVE

## 2024-05-17 LAB — SURGICAL PCR SCREEN
MRSA, PCR: NEGATIVE
Staphylococcus aureus: POSITIVE — AB

## 2024-05-17 NOTE — Telephone Encounter (Signed)
 Copied from CRM [id number]. Topic: General - Call Back - No Documentation >> May 16, 2024  4:14 PM Harlene ORN wrote: Reason for CRM: Patient is returning a call from lindsey in regards to her prolia  shot. Please call back the patient with more information.

## 2024-05-17 NOTE — Patient Instructions (Signed)
 Your procedure is scheduled on:05-23-24 Thursday Report to the Registration Desk on the 1st floor of the [facility].Then proceed to the 2nd floor Surgery Desk To find out your arrival time, please call [phone] between 1PM - 3PM on:05-22-24 Wednesday If your arrival time is 6:00 am, do not arrive before that time as the [facility] entrance doors do not open until 6:00 am.  REMEMBER: Instructions that are not followed completely may result in serious medical risk, up to and including death; or upon the discretion of your surgeon and anesthesiologist your surgery may need to be rescheduled.  Do not eat food after midnight the night before surgery.  No gum chewing or hard candies.  You may however, drink CLEAR liquids up to 2 hours before you are scheduled to arrive for your surgery. Do not drink anything within 2 hours of your scheduled arrival time.  Clear liquids include: - water  - apple juice without pulp - gatorade (not RED colors) - black coffee or tea (Do NOT add milk or creamers to the coffee or tea) Do NOT drink anything that is not on this list.  One week prior to surgery:Stop NOW (05-17-24) Stop ANY OVER THE COUNTER supplements until after surgery (Calcium -Vitamin D , Vitamin D , Cranberry , Glucosamine-Chondroitin, Multivitamin, Fish Oil)  Continue taking all of your other prescription medications up until the day of surgery.  ON THE DAY OF SURGERY ONLY TAKE THESE MEDICATIONS WITH SIPS OF WATER: -gabapentin  (NEURONTIN )   No Alcohol for 24 hours before or after surgery.  No Smoking including e-cigarettes for 24 hours before surgery.  No chewable tobacco products for at least 6 hours before surgery.  No nicotine patches on the day of surgery.  Do not use any recreational drugs for at least a week (preferably 2 weeks) before your surgery.  Please be advised that the combination of cocaine and anesthesia may have negative outcomes, up to and including death. If you  test positive for cocaine, your surgery will be cancelled.  On the morning of surgery brush your teeth with toothpaste and water, you may rinse your mouth with mouthwash if you wish. Do not swallow any toothpaste or mouthwash.  Use CHG Soap as directed on instruction sheet.  Do not wear jewelry, make-up, hairpins, clips or nail polish.  For welded (permanent) jewelry: bracelets, anklets, waist bands, etc.  Please have this removed prior to surgery.  If it is not removed, there is a chance that hospital personnel will need to cut it off on the day of surgery.  Do not wear lotions, powders, or perfumes.   Do not shave body hair from the neck down 48 hours before surgery.  Contact lenses, hearing aids and dentures may not be worn into surgery.  Do not bring valuables to the hospital. [facility] is not responsible for any missing/lost belongings or valuables.    Notify your doctor if there is any change in your medical condition (cold, fever, infection).  Wear comfortable clothing (specific to your surgery type) to the hospital.  After surgery, you can help prevent lung complications by doing breathing exercises.  Take deep breaths and cough every 1-2 hours. Your doctor may order a device called an Incentive Spirometer to help you take deep breaths. When coughing or sneezing, hold a pillow firmly against your incision with both hands. This is called "splinting." Doing this helps protect your incision. It also decreases belly discomfort.  If you are being admitted to the hospital overnight, leave  your suitcase in the car. After surgery it may be brought to your room.  In case of increased patient census, it may be necessary for you, the patient, to continue your postoperative care in the Same Day Surgery department.  If you are being discharged the day of surgery, you will not be allowed to drive home. You will need a responsible individual to drive you home and stay with you for 24 hours  after surgery.   If you are taking public transportation, you will need to have a responsible individual with you.  Please call the Pre-admissions Testing Dept. at [phone] if you have any questions about these instructions.  Surgery Visitation Policy:  Patients having surgery or a procedure may have two visitors.  Children under the age of 31 must have an adult with them who is not the patient.  Inpatient Visitation:    Visiting hours are 7 a.m. to 8 p.m. Up to four visitors are allowed at one time in a patient room. The visitors may rotate out with other people during the day.  One visitor age 73 or older may stay with the patient overnight and must be in the room by 8 p.m.    Pre-operative 5 CHG Bath Instructions   You can play a key role in reducing the risk of infection after surgery. Your skin needs to be as free of germs as possible. You can reduce the number of germs on your skin by washing with CHG (chlorhexidine  gluconate) soap before surgery. CHG is an antiseptic soap that kills germs and continues to kill germs even after washing.   DO NOT use if you have an allergy to chlorhexidine /CHG or antibacterial soaps. If your skin becomes reddened or irritated, stop using the CHG and notify one of our RNs at [phone].   Please shower with the CHG soap starting 4 days before surgery using the following schedule:     Please keep in mind the following:  DO NOT shave, including legs and underarms, starting the day of your first shower.   You may shave your face at any point before/day of surgery.  Place clean sheets on your bed the day you start using CHG soap. Use a clean washcloth (not used since being washed) for each shower. DO NOT sleep with pets once you start using the CHG.   CHG Shower Instructions:  If you choose to wash your hair and private area, wash first with your normal shampoo/soap.  After you use shampoo/soap, rinse your hair and body thoroughly to  remove shampoo/soap residue.  Turn the water OFF and apply about 3 tablespoons (45 ml) of CHG soap to a CLEAN washcloth.  Apply CHG soap ONLY FROM YOUR NECK DOWN TO YOUR TOES (washing for 3-5 minutes)  DO NOT use CHG soap on face, private areas, open wounds, or sores.  Pay special attention to the area where your surgery is being performed.  If you are having back surgery, having someone wash your back for you may be helpful. Wait 2 minutes after CHG soap is applied, then you may rinse off the CHG soap.  Pat dry with a clean towel  Put on clean clothes/pajamas   If you choose to wear lotion, please use ONLY the CHG-compatible lotions on the back of this paper.     Additional instructions for the day of surgery: DO NOT APPLY any lotions, deodorants, cologne, or perfumes.   Put on clean/comfortable clothes.  Brush your teeth.  Ask  your nurse before applying any prescription medications to the skin.      CHG Compatible Lotions   Aveeno Moisturizing lotion  Cetaphil Moisturizing Cream  Cetaphil Moisturizing Lotion  Clairol Herbal Essence Moisturizing Lotion, Dry Skin  Clairol Herbal Essence Moisturizing Lotion, Extra Dry Skin  Clairol Herbal Essence Moisturizing Lotion, Normal Skin  Curel Age Defying Therapeutic Moisturizing Lotion with Alpha Hydroxy  Curel Extreme Care Body Lotion  Curel Soothing Hands Moisturizing Hand Lotion  Curel Therapeutic Moisturizing Cream, Fragrance-Free  Curel Therapeutic Moisturizing Lotion, Fragrance-Free  Curel Therapeutic Moisturizing Lotion, Original Formula  Eucerin Daily Replenishing Lotion  Eucerin Dry Skin Therapy Plus Alpha Hydroxy Crme  Eucerin Dry Skin Therapy Plus Alpha Hydroxy Lotion  Eucerin Original Crme  Eucerin Original Lotion  Eucerin Plus Crme Eucerin Plus Lotion  Eucerin TriLipid Replenishing Lotion  Keri Anti-Bacterial Hand Lotion  Keri Deep Conditioning Original Lotion Dry Skin Formula Softly Scented  Keri Deep Conditioning  Original Lotion, Fragrance Free Sensitive Skin Formula  Keri Lotion Fast Absorbing Fragrance Free Sensitive Skin Formula  Keri Lotion Fast Absorbing Softly Scented Dry Skin Formula  Keri Original Lotion  Keri Skin Renewal Lotion Keri Silky Smooth Lotion  Keri Silky Smooth Sensitive Skin Lotion  Nivea Body Creamy Conditioning Oil  Nivea Body Extra Enriched Lotion  Nivea Body Original Lotion  Nivea Body Sheer Moisturizing Lotion Nivea Crme  Nivea Skin Firming Lotion  NutraDerm 30 Skin Lotion  NutraDerm Skin Lotion  NutraDerm Therapeutic Skin Cream  NutraDerm Therapeutic Skin Lotion  ProShield Protective Hand Cream  Provon moisturizing lotion  Pre-operative 5 CHG Bath Instructions   You can play a key role in reducing the risk of infection after surgery. Your skin needs to be as free of germs as possible. You can reduce the number of germs on your skin by washing with CHG (chlorhexidine  gluconate) soap before surgery. CHG is an antiseptic soap that kills germs and continues to kill germs even after washing.   DO NOT use if you have an allergy to chlorhexidine /CHG or antibacterial soaps. If your skin becomes reddened or irritated, stop using the CHG and notify one of our RNs at [phone].   Please shower with the CHG soap starting 4 days before surgery using the following schedule:     Please keep in mind the following:  DO NOT shave, including legs and underarms, starting the day of your first shower.   You may shave your face at any point before/day of surgery.  Place clean sheets on your bed the day you start using CHG soap. Use a clean washcloth (not used since being washed) for each shower. DO NOT sleep with pets once you start using the CHG.   CHG Shower Instructions:  If you choose to wash your hair and private area, wash first with your normal shampoo/soap.  After you use shampoo/soap, rinse your hair and body thoroughly to remove shampoo/soap residue.  Turn the water  OFF and apply about 3 tablespoons (45 ml) of CHG soap to a CLEAN washcloth.  Apply CHG soap ONLY FROM YOUR NECK DOWN TO YOUR TOES (washing for 3-5 minutes)  DO NOT use CHG soap on face, private areas, open wounds, or sores.  Pay special attention to the area where your surgery is being performed.  If you are having back surgery, having someone wash your back for you may be helpful. Wait 2 minutes after CHG soap is applied, then you may rinse off the CHG soap.  Pat dry with a clean  towel  Put on clean clothes/pajamas   If you choose to wear lotion, please use ONLY the CHG-compatible lotions on the back of this paper.     Additional instructions for the day of surgery: DO NOT APPLY any lotions, deodorants, cologne, or perfumes.   Put on clean/comfortable clothes.  Brush your teeth.  Ask your nurse before applying any prescription medications to the skin.      CHG Compatible Lotions   Aveeno Moisturizing lotion  Cetaphil Moisturizing Cream  Cetaphil Moisturizing Lotion  Clairol Herbal Essence Moisturizing Lotion, Dry Skin  Clairol Herbal Essence Moisturizing Lotion, Extra Dry Skin  Clairol Herbal Essence Moisturizing Lotion, Normal Skin  Curel Age Defying Therapeutic Moisturizing Lotion with Alpha Hydroxy  Curel Extreme Care Body Lotion  Curel Soothing Hands Moisturizing Hand Lotion  Curel Therapeutic Moisturizing Cream, Fragrance-Free  Curel Therapeutic Moisturizing Lotion, Fragrance-Free  Curel Therapeutic Moisturizing Lotion, Original Formula  Eucerin Daily Replenishing Lotion  Eucerin Dry Skin Therapy Plus Alpha Hydroxy Crme  Eucerin Dry Skin Therapy Plus Alpha Hydroxy Lotion  Eucerin Original Crme  Eucerin Original Lotion  Eucerin Plus Crme Eucerin Plus Lotion  Eucerin TriLipid Replenishing Lotion  Keri Anti-Bacterial Hand Lotion  Keri Deep Conditioning Original Lotion Dry Skin Formula Softly Scented  Keri Deep Conditioning Original Lotion, Fragrance Free Sensitive  Skin Formula  Keri Lotion Fast Absorbing Fragrance Free Sensitive Skin Formula  Keri Lotion Fast Absorbing Softly Scented Dry Skin Formula  Keri Original Lotion  Keri Skin Renewal Lotion Keri Silky Smooth Lotion  Keri Silky Smooth Sensitive Skin Lotion  Nivea Body Creamy Conditioning Oil  Nivea Body Extra Enriched Teacher, adult education Moisturizing Lotion Nivea Crme  Nivea Skin Firming Lotion  NutraDerm 30 Skin Lotion  NutraDerm Skin Lotion  NutraDerm Therapeutic Skin Cream  NutraDerm Therapeutic Skin Lotion  ProShield Protective Hand Cream  Provon moisturizing lotion   Merchandiser, retail to address health-related social needs:  [email or web address]

## 2024-05-20 ENCOUNTER — Encounter: Payer: Self-pay | Admitting: Neurosurgery

## 2024-05-20 ENCOUNTER — Ambulatory Visit (INDEPENDENT_AMBULATORY_CARE_PROVIDER_SITE_OTHER): Admitting: Neurosurgery

## 2024-05-20 VITALS — BP 138/88 | Ht 64.0 in | Wt 114.0 lb

## 2024-05-20 DIAGNOSIS — M7138 Other bursal cyst, other site: Secondary | ICD-10-CM | POA: Diagnosis not present

## 2024-05-20 DIAGNOSIS — R29898 Other symptoms and signs involving the musculoskeletal system: Secondary | ICD-10-CM

## 2024-05-20 DIAGNOSIS — M5416 Radiculopathy, lumbar region: Secondary | ICD-10-CM

## 2024-05-20 NOTE — Progress Notes (Signed)
 Referring Physician:  Gretta Comer POUR, NP [address] [place],  [place] [address]  Primary Physician:  Gretta Comer POUR, NP  History of Present Illness: 05/20/2024 [name] is here today with a chief complaint of right low back pain that radiates into her right lower extremity to the inside of her groin and back of her knee.  She states that it has improved some since her initial onset in August, but she still has significant amount of pain especially in the mornings and this is affected her activities of daily living.  She is not able to walk like normal and is having difficulty getting around and doing the things that she would like.  She states she is not falling, but feels as though she has to walk very slow.  She does feel as though her right hip and top of her right leg is weak.  Denies any saddle anesthesia or incontinence of bowel or bladder.  She is here today to discuss surgery.  We discussed that over the phone previously.  She has had an overall improvement in her pain but continues to have significant weakness.  Duration: August 2025 Weakness: none Timing: intermittent, worse in the morning Bowel/Bladder Dysfunction: none  Conservative measures:  Physical therapy: currently doing PT at [facility], she has done about 5-6 visits Multimodal medical therapy including regular antiinflammatories: Prednisone , Ibuprofen  Injections: no epidural steroid injections  Past Surgery: no spinal surgeries  [name] has no symptoms of cervical myelopathy.  The symptoms are causing a significant impact on the patient's life.   Review of Systems:  A 10 point review of systems is negative, except for the pertinent positives and negatives detailed in the HPI.  Past Medical History: Past Medical History:  Diagnosis Date   Arthritis    Atrophy of vagina    Atypical chest pain    a. 2000 ETT [facility]): Abnl w/ ST/T changes; b. 2000 Stress Echo [facility]): Normal.    Cystocele    GERD (gastroesophageal reflux disease)    Glaucoma    Heart murmur    Hyperlipidemia    Lumbar radiculopathy    Mitral valve prolapse    a. 2000 Stress echo [facility]): MVP; b. 01/2023 Echo: EF 55-60%, no rwma, nl RV fxn, mildly-mod dil LA, normal MV structure/fxn. Triv AI.   Osteoporosis    Palpitations    Pre-diabetes    PSVT (paroxysmal supraventricular tachycardia)    a. 11/2022 [facility]: Predominantly sinus rhythm, average 73 (48-1 37).  77 nonsustained SVT runs, longest 13.1 seconds at average to 117.  Strips suggest atrial tachycardia.  Rare PACs/PVCs.  No A-fib, sustained arrhythmias, or pauses.   Rectocele    Status post bilateral cataract extraction    Synovial cyst of lumbar spine    Uveitis of left eye    White coat syndrome without diagnosis of hypertension     Past Surgical History: Past Surgical History:  Procedure Laterality Date   CATARACT EXTRACTION, BILATERAL     COLONOSCOPY WITH PROPOFOL  N/A 02/12/2019   Procedure: COLONOSCOPY WITH PROPOFOL ;  Surgeon: Jinny Carmine, MD;  Location: [facility] ENDOSCOPY;  Service: Endoscopy;  Laterality: N/A;   ESOPHAGOGASTRODUODENOSCOPY (EGD) WITH PROPOFOL  N/A 02/12/2019   Procedure: ESOPHAGOGASTRODUODENOSCOPY (EGD) WITH PROPOFOL ;  Surgeon: Jinny Carmine, MD;  Location: [facility] ENDOSCOPY;  Service: Endoscopy;  Laterality: N/A;   TUBAL LIGATION     VAGINAL HYSTERECTOMY Bilateral 02/12/2018   Procedure: HYSTERECTOMY VAGINAL WITH BILATERAL SALPINGO OOPHERECTOMY;  Surgeon: Kathe Lunger  A, MD;  Location: [facility] ORS;  Service: Gynecology;  Laterality: Bilateral;    Allergies: Allergies as of 05/20/2024 - Review Complete 05/20/2024  Allergen Reaction Noted   Xalatan [latanoprost] Itching, Swelling, and Other (See Comments) 05/06/2013    Medications: Outpatient Encounter Medications as of 05/20/2024  Medication Sig   atorvastatin  (LIPITOR) 10 MG tablet Take 1 tablet (10 mg total) by mouth daily. for cholesterol. (Patient taking  differently: Take 10 mg by mouth at bedtime. for cholesterol.)   brimonidine (ALPHAGAN) 0.2 % ophthalmic solution Place 1 drop into the left eye 2 (two) times daily.    CALCIUM  CARBONATE-VITAMIN D  PO Take 1 tablet by mouth 2 (two) times daily.    cholecalciferol (VITAMIN D ) 1000 UNITS tablet Take 1,000 Units by mouth daily.   Cranberry 500 MG CAPS Take 500 mg by mouth daily.    dorzolamide-timolol (COSOPT) 22.3-6.8 MG/ML ophthalmic solution Place 1 drop into the left eye every 12 (twelve) hours.   estradiol  (ESTRACE ) 0.1 MG/GM vaginal cream INSERT 0.5G VAGINALLY TWICE WEEKLY   gabapentin  (NEURONTIN ) 100 MG capsule Take 1 capsule (100 mg total) by mouth 2 (two) times daily.   GLUCOSAMINE CHONDROITIN COMPLX PO Take 1 tablet by mouth daily.    hydrocortisone  2.5 % lotion Apply topically as directed. Apply to ears 3 nights per week Monday, Wednesday, and Fridays (Patient taking differently: Apply 1 Application topically as directed. Apply to ears 1-2  nights per week Monday, Wednesday, and Fridays prn)   Multiple Vitamin (MULTIVITAMIN) tablet Take 1 tablet by mouth daily.   Omega-3 Fatty Acids (FISH OIL) 1200 MG CAPS Take 2,400 mg by mouth daily.    triamcinolone  cream (KENALOG ) 0.1 % Apply twice daily to affected area on back up to 2 weeks as needed for rash/itching. Avoid applying to face, groin, and axilla.   Facility-Administered Encounter Medications as of 05/20/2024  Medication   [START ON 05/23/2024] denosumab  (PROLIA ) injection 60 mg    Social History: Social History   Tobacco Use   Smoking status: Never   Smokeless tobacco: Never  Vaping Use   Vaping status: Never Used  Substance Use Topics   Alcohol use: Yes    Alcohol/week: 1.0 standard drink of alcohol    Types: 1 Glasses of wine per week    Comment: wine weekly   Drug use: No    Family Medical History: Family History  Problem Relation Age of Onset   Heart attack Mother    Aortic stenosis Mother    Heart disease Mother     Osteoporosis Mother    Arthritis Mother    Hearing loss Mother    Hyperlipidemia Mother    Hypertension Mother    Heart attack Father    Heart failure Father    Cancer Father        prostate   Heart disease Father    AAA (abdominal aortic aneurysm) Father    Hyperlipidemia Father    Hypertension Father    Multiple sclerosis Daughter    Breast cancer Other    Heart disease Son    Heart disease Sister    Hypertension Sister    Intellectual disability Sister    Hyperlipidemia Sister     Physical Examination: @VITALWITHPAIN @  General: Patient is well developed, well nourished, calm, collected, and in no apparent distress. Attention to examination is appropriate.  Psychiatric: Patient is non-anxious.  Head:  Pupils equal, round, and reactive to light.  ENT:  Oral mucosa appears well hydrated.  Neck:  Supple.    Respiratory: Patient is breathing without any difficulty.  Extremities: No edema.  Vascular: Palpable dorsal pedal pulses.  Skin:   On exposed skin, there are no abnormal skin lesions.  NEUROLOGICAL:     Awake, alert, oriented to person, place, and time.  Speech is clear and fluent. Fund of knowledge is appropriate.   Cranial Nerves: Pupils equal round and reactive to light.  Facial tone is symmetric.   ROM of spine: Some increased tenderness palpation of her right lumbar paraspinals.  Strength:  Side Iliopsoas Quads Hamstring PF DF EHL  R 4- 5 4+ 5 5 5   L 5 5 5 5 5 5    Reflexes are 2+ patella, trace left hamstring reflex absent right hamstring reflex.  1+ Achilles  Some decrease sensation to her anterior medial thigh of her right lower extremity Patient has some difficulty with tandem gait.  Medical Decision Making  Imaging: EXAM: MRI LUMBAR SPINE WITHOUT CONTRAST   TECHNIQUE: Multiplanar, multisequence MR imaging of the lumbar spine was performed. No intravenous contrast was administered.   COMPARISON:  Radiographs 04/12/2024    FINDINGS: Segmentation: There are five lumbar type vertebral bodies. The last full intervertebral disc space is labeled L5-S1. This correlates with the radiographs.   Alignment: Left convex lumbar scoliosis but normal overall alignment in the sagittal plane.   Vertebrae:  Normal marrow signal.  No bone lesions or fractures.   Conus medullaris and cauda equina: Conus extends to the T12-L1 level. Conus and cauda equina appear normal.   Paraspinal and other soft tissues: No significant paraspinal or retroperitoneal findings. Simple renal cysts. Prominent extra renal pelvis on the left side.   Disc levels:   T12-L1: Bilateral perineural cysts or dilated nerve root sheaths. No disc protrusions, spinal or foraminal stenosis.   L1-2: Mild asymmetric left-sided facet disease and ligamentum flavum thickening but no disc protrusions, spinal or foraminal stenosis.   L2-3: Diffuse bulging annulus, osteophytic ridging and facet disease contributing to mild spinal stenosis and moderate bilateral lateral recess stenosis, right greater than left. Mild left foraminal narrowing due to a broad-based foraminal/extraforaminal disc osteophyte complex.   L3-4: There is a large complex synovial cyst extruding into the spinal canal and exerting significant mass effect on the right side of the thecal sac. It also appears to contact the exiting right L3 nerve root medially and is significantly compressing the right L4 nerve root in the lateral recess.   L4-5: Degenerative disc disease and facet disease with mild to moderate bilateral lateral recess stenosis but no significant spinal stenosis. There is right foraminal narrowing due to a right-sided disc osteophyte complex.   L5-S1: Degenerative disc disease and facet disease but no disc protrusions, spinal or lateral recess stenosis. Mild left foraminal narrowing due to a left-sided disc osteophyte complex.   IMPRESSION: 1. Large (16 x 14 mm)  right-sided complex synovial cyst extruding into the spinal canal at L3-4 and exerting significant mass effect on the right side of the thecal sac. It also appears to contact the exiting right L3 nerve root medially and is significantly compressing the right L4 nerve root in the lateral recess. 2. Mild spinal stenosis and moderate bilateral lateral recess stenosis, right greater than left at L2-3. Mild left foraminal narrowing due to a broad-based foraminal/extraforaminal disc osteophyte complex. 3. Mild to moderate bilateral lateral recess stenosis at L4-5. There is right foraminal narrowing due to a right-sided disc osteophyte complex. 4. Mild left foraminal narrowing at L5-S1 due to  a left-sided disc osteophyte complex.  I have personally reviewed the images and agree with the above interpretation.  Assessment and Plan: Ms. Brophy is a pleasant 78 y.o. female with acute lumbar back pain in her right side that radiates to her anterior and medial thigh of her right lower extremity.  She has a very large complex synovial cyst causing significant stenosis at L3-4 that is compressive in nature.  Her pain and weakness fit the dermatomal pattern and myotomal pattern respectively.  She has decreased L3-4 reflexes.  She has significant weakness in knee extension and hip flexion.  We discussed her large complex sign OVL cyst that is causing severe compression in her lumbar spine.  Will plan for a laminectomy at L3-4 with resection decompression of her synovial cyst.  We discussed the risks and benefits of surgery which included but were not limited to CSF leak, nerve injury, failure to improve symptoms, need for further surgery, recurrence of the cyst, continued pain.  Given her deficit and weakness in her lower extremities she would like to go forward with the surgery.  We look forward to seeing her on the second for her decompression.   Thank you for involving me in the care of this patient.     Penne MICAEL Sharps, MD Dept. of Neurosurgery

## 2024-05-20 NOTE — H&P (View-Only) (Signed)
 Referring Physician:  Gretta Comer POUR, NP [address] [place],  [place] [address]  Primary Physician:  Gretta Comer POUR, NP  History of Present Illness: 05/20/2024 [name] is here today with a chief complaint of right low back pain that radiates into her right lower extremity to the inside of her groin and back of her knee.  She states that it has improved some since her initial onset in August, but she still has significant amount of pain especially in the mornings and this is affected her activities of daily living.  She is not able to walk like normal and is having difficulty getting around and doing the things that she would like.  She states she is not falling, but feels as though she has to walk very slow.  She does feel as though her right hip and top of her right leg is weak.  Denies any saddle anesthesia or incontinence of bowel or bladder.  She is here today to discuss surgery.  We discussed that over the phone previously.  She has had an overall improvement in her pain but continues to have significant weakness.  Duration: August 2025 Weakness: none Timing: intermittent, worse in the morning Bowel/Bladder Dysfunction: none  Conservative measures:  Physical therapy: currently doing PT at [facility], she has done about 5-6 visits Multimodal medical therapy including regular antiinflammatories: Prednisone , Ibuprofen  Injections: no epidural steroid injections  Past Surgery: no spinal surgeries  Christy [name] has no symptoms of cervical myelopathy.  The symptoms are causing a significant impact on the patient's life.   Review of Systems:  A 10 point review of systems is negative, except for the pertinent positives and negatives detailed in the HPI.  Past Medical History: Past Medical History:  Diagnosis Date   Arthritis    Atrophy of vagina    Atypical chest pain    a. 2000 ETT [facility]): Abnl w/ ST/T changes; b. 2000 Stress Echo [facility]): Normal.    Cystocele    GERD (gastroesophageal reflux disease)    Glaucoma    Heart murmur    Hyperlipidemia    Lumbar radiculopathy    Mitral valve prolapse    a. 2000 Stress echo [facility]): MVP; b. 01/2023 Echo: EF 55-60%, no rwma, nl RV fxn, mildly-mod dil LA, normal MV structure/fxn. Triv AI.   Osteoporosis    Palpitations    Pre-diabetes    PSVT (paroxysmal supraventricular tachycardia)    a. 11/2022 [facility]: Predominantly sinus rhythm, average 73 (48-1 37).  77 nonsustained SVT runs, longest 13.1 seconds at average to 117.  Strips suggest atrial tachycardia.  Rare PACs/PVCs.  No A-fib, sustained arrhythmias, or pauses.   Rectocele    Status post bilateral cataract extraction    Synovial cyst of lumbar spine    Uveitis of left eye    White coat syndrome without diagnosis of hypertension     Past Surgical History: Past Surgical History:  Procedure Laterality Date   CATARACT EXTRACTION, BILATERAL     COLONOSCOPY WITH PROPOFOL  N/A 02/12/2019   Procedure: COLONOSCOPY WITH PROPOFOL ;  Surgeon: Jinny Carmine, MD;  Location: [facility] ENDOSCOPY;  Service: Endoscopy;  Laterality: N/A;   ESOPHAGOGASTRODUODENOSCOPY (EGD) WITH PROPOFOL  N/A 02/12/2019   Procedure: ESOPHAGOGASTRODUODENOSCOPY (EGD) WITH PROPOFOL ;  Surgeon: Jinny Carmine, MD;  Location: [facility] ENDOSCOPY;  Service: Endoscopy;  Laterality: N/A;   TUBAL LIGATION     VAGINAL HYSTERECTOMY Bilateral 02/12/2018   Procedure: HYSTERECTOMY VAGINAL WITH BILATERAL SALPINGO OOPHERECTOMY;  Surgeon: Kathe Lunger  A, MD;  Location: [facility] ORS;  Service: Gynecology;  Laterality: Bilateral;    Allergies: Allergies as of 05/20/2024 - Review Complete 05/20/2024  Allergen Reaction Noted   Xalatan [latanoprost] Itching, Swelling, and Other (See Comments) 05/06/2013    Medications: Outpatient Encounter Medications as of 05/20/2024  Medication Sig   atorvastatin  (LIPITOR) 10 MG tablet Take 1 tablet (10 mg total) by mouth daily. for cholesterol. (Patient taking  differently: Take 10 mg by mouth at bedtime. for cholesterol.)   brimonidine (ALPHAGAN) 0.2 % ophthalmic solution Place 1 drop into the left eye 2 (two) times daily.    CALCIUM  CARBONATE-VITAMIN D  PO Take 1 tablet by mouth 2 (two) times daily.    cholecalciferol (VITAMIN D ) 1000 UNITS tablet Take 1,000 Units by mouth daily.   Cranberry 500 MG CAPS Take 500 mg by mouth daily.    dorzolamide-timolol (COSOPT) 22.3-6.8 MG/ML ophthalmic solution Place 1 drop into the left eye every 12 (twelve) hours.   estradiol  (ESTRACE ) 0.1 MG/GM vaginal cream INSERT 0.5G VAGINALLY TWICE WEEKLY   gabapentin  (NEURONTIN ) 100 MG capsule Take 1 capsule (100 mg total) by mouth 2 (two) times daily.   GLUCOSAMINE CHONDROITIN COMPLX PO Take 1 tablet by mouth daily.    hydrocortisone  2.5 % lotion Apply topically as directed. Apply to ears 3 nights per week Monday, Wednesday, and Fridays (Patient taking differently: Apply 1 Application topically as directed. Apply to ears 1-2  nights per week Monday, Wednesday, and Fridays prn)   Multiple Vitamin (MULTIVITAMIN) tablet Take 1 tablet by mouth daily.   Omega-3 Fatty Acids (FISH OIL) 1200 MG CAPS Take 2,400 mg by mouth daily.    triamcinolone  cream (KENALOG ) 0.1 % Apply twice daily to affected area on back up to 2 weeks as needed for rash/itching. Avoid applying to face, groin, and axilla.   Facility-Administered Encounter Medications as of 05/20/2024  Medication   [START ON 05/23/2024] denosumab  (PROLIA ) injection 60 mg    Social History: Social History   Tobacco Use   Smoking status: Never   Smokeless tobacco: Never  Vaping Use   Vaping status: Never Used  Substance Use Topics   Alcohol use: Yes    Alcohol/week: 1.0 standard drink of alcohol    Types: 1 Glasses of wine per week    Comment: wine weekly   Drug use: No    Family Medical History: Family History  Problem Relation Age of Onset   Heart attack Mother    Aortic stenosis Mother    Heart disease Mother     Osteoporosis Mother    Arthritis Mother    Hearing loss Mother    Hyperlipidemia Mother    Hypertension Mother    Heart attack Father    Heart failure Father    Cancer Father        prostate   Heart disease Father    AAA (abdominal aortic aneurysm) Father    Hyperlipidemia Father    Hypertension Father    Multiple sclerosis Daughter    Breast cancer Other    Heart disease Son    Heart disease Sister    Hypertension Sister    Intellectual disability Sister    Hyperlipidemia Sister     Physical Examination: @VITALWITHPAIN @  General: Patient is well developed, well nourished, calm, collected, and in no apparent distress. Attention to examination is appropriate.  Psychiatric: Patient is non-anxious.  Head:  Pupils equal, round, and reactive to light.  ENT:  Oral mucosa appears well hydrated.  Neck:  Supple.    Respiratory: Patient is breathing without any difficulty.  Extremities: No edema.  Vascular: Palpable dorsal pedal pulses.  Skin:   On exposed skin, there are no abnormal skin lesions.  NEUROLOGICAL:     Awake, alert, oriented to person, place, and time.  Speech is clear and fluent. Fund of knowledge is appropriate.   Cranial Nerves: Pupils equal round and reactive to light.  Facial tone is symmetric.   ROM of spine: Some increased tenderness palpation of her right lumbar paraspinals.  Strength:  Side Iliopsoas Quads Hamstring PF DF EHL  R 4- 5 4+ 5 5 5   L 5 5 5 5 5 5    Reflexes are 2+ patella, trace left hamstring reflex absent right hamstring reflex.  1+ Achilles  Some decrease sensation to her anterior medial thigh of her right lower extremity Patient has some difficulty with tandem gait.  Medical Decision Making  Imaging: EXAM: MRI LUMBAR SPINE WITHOUT CONTRAST   TECHNIQUE: Multiplanar, multisequence MR imaging of the lumbar spine was performed. No intravenous contrast was administered.   COMPARISON:  Radiographs 04/12/2024    FINDINGS: Segmentation: There are five lumbar type vertebral bodies. The last full intervertebral disc space is labeled L5-S1. This correlates with the radiographs.   Alignment: Left convex lumbar scoliosis but normal overall alignment in the sagittal plane.   Vertebrae:  Normal marrow signal.  No bone lesions or fractures.   Conus medullaris and cauda equina: Conus extends to the T12-L1 level. Conus and cauda equina appear normal.   Paraspinal and other soft tissues: No significant paraspinal or retroperitoneal findings. Simple renal cysts. Prominent extra renal pelvis on the left side.   Disc levels:   T12-L1: Bilateral perineural cysts or dilated nerve root sheaths. No disc protrusions, spinal or foraminal stenosis.   L1-2: Mild asymmetric left-sided facet disease and ligamentum flavum thickening but no disc protrusions, spinal or foraminal stenosis.   L2-3: Diffuse bulging annulus, osteophytic ridging and facet disease contributing to mild spinal stenosis and moderate bilateral lateral recess stenosis, right greater than left. Mild left foraminal narrowing due to a broad-based foraminal/extraforaminal disc osteophyte complex.   L3-4: There is a large complex synovial cyst extruding into the spinal canal and exerting significant mass effect on the right side of the thecal sac. It also appears to contact the exiting right L3 nerve root medially and is significantly compressing the right L4 nerve root in the lateral recess.   L4-5: Degenerative disc disease and facet disease with mild to moderate bilateral lateral recess stenosis but no significant spinal stenosis. There is right foraminal narrowing due to a right-sided disc osteophyte complex.   L5-S1: Degenerative disc disease and facet disease but no disc protrusions, spinal or lateral recess stenosis. Mild left foraminal narrowing due to a left-sided disc osteophyte complex.   IMPRESSION: 1. Large (16 x 14 mm)  right-sided complex synovial cyst extruding into the spinal canal at L3-4 and exerting significant mass effect on the right side of the thecal sac. It also appears to contact the exiting right L3 nerve root medially and is significantly compressing the right L4 nerve root in the lateral recess. 2. Mild spinal stenosis and moderate bilateral lateral recess stenosis, right greater than left at L2-3. Mild left foraminal narrowing due to a broad-based foraminal/extraforaminal disc osteophyte complex. 3. Mild to moderate bilateral lateral recess stenosis at L4-5. There is right foraminal narrowing due to a right-sided disc osteophyte complex. 4. Mild left foraminal narrowing at L5-S1 due to  a left-sided disc osteophyte complex.  I have personally reviewed the images and agree with the above interpretation.  Assessment and Plan: Ms. Brophy is a pleasant 78 y.o. female with acute lumbar back pain in her right side that radiates to her anterior and medial thigh of her right lower extremity.  She has a very large complex synovial cyst causing significant stenosis at L3-4 that is compressive in nature.  Her pain and weakness fit the dermatomal pattern and myotomal pattern respectively.  She has decreased L3-4 reflexes.  She has significant weakness in knee extension and hip flexion.  We discussed her large complex sign OVL cyst that is causing severe compression in her lumbar spine.  Will plan for a laminectomy at L3-4 with resection decompression of her synovial cyst.  We discussed the risks and benefits of surgery which included but were not limited to CSF leak, nerve injury, failure to improve symptoms, need for further surgery, recurrence of the cyst, continued pain.  Given her deficit and weakness in her lower extremities she would like to go forward with the surgery.  We look forward to seeing her on the second for her decompression.   Thank you for involving me in the care of this patient.     Penne MICAEL Sharps, MD Dept. of Neurosurgery

## 2024-05-23 ENCOUNTER — Encounter
Admission: RE | Disposition: A | Discharge status: Home / Self Care | Payer: Self-pay | Source: Ambulatory Visit | Attending: Neurosurgery

## 2024-05-23 ENCOUNTER — Encounter: Payer: Self-pay | Admitting: Neurosurgery

## 2024-05-23 ENCOUNTER — Ambulatory Visit: Payer: Self-pay | Admitting: Urgent Care

## 2024-05-23 ENCOUNTER — Ambulatory Visit
Admission: RE | Admit: 2024-05-23 | Discharge: 2024-05-24 | Disposition: A | Discharge status: Home / Self Care | Source: Ambulatory Visit | Attending: Neurosurgery | Admitting: Neurosurgery

## 2024-05-23 ENCOUNTER — Other Ambulatory Visit: Payer: Self-pay

## 2024-05-23 ENCOUNTER — Ambulatory Visit

## 2024-05-23 DIAGNOSIS — M810 Age-related osteoporosis without current pathological fracture: Secondary | ICD-10-CM | POA: Insufficient documentation

## 2024-05-23 DIAGNOSIS — Z01818 Encounter for other preprocedural examination: Secondary | ICD-10-CM

## 2024-05-23 DIAGNOSIS — R29898 Other symptoms and signs involving the musculoskeletal system: Secondary | ICD-10-CM

## 2024-05-23 DIAGNOSIS — M5416 Radiculopathy, lumbar region: Secondary | ICD-10-CM | POA: Insufficient documentation

## 2024-05-23 DIAGNOSIS — F1090 Alcohol use, unspecified, uncomplicated: Secondary | ICD-10-CM | POA: Diagnosis not present

## 2024-05-23 DIAGNOSIS — I10 Essential (primary) hypertension: Secondary | ICD-10-CM | POA: Diagnosis not present

## 2024-05-23 DIAGNOSIS — M6588 Other synovitis and tenosynovitis, other site: Secondary | ICD-10-CM | POA: Diagnosis not present

## 2024-05-23 DIAGNOSIS — M7138 Other bursal cyst, other site: Secondary | ICD-10-CM | POA: Insufficient documentation

## 2024-05-23 HISTORY — DX: Palpitations: R00.2

## 2024-05-23 HISTORY — DX: Cataract extraction status, right eye: Z98.42

## 2024-05-23 HISTORY — DX: Cataract extraction status, right eye: Z98.41

## 2024-05-23 HISTORY — PX: DECOMPRESSIVE LUMBAR LAMINECTOMY LEVEL 1: SHX5791

## 2024-05-23 HISTORY — DX: Unspecified iridocyclitis: H20.9

## 2024-05-23 SURGERY — DECOMPRESSIVE LUMBAR LAMINECTOMY LEVEL 1
Anesthesia: General | Laterality: Right

## 2024-05-23 MED ORDER — SODIUM CHLORIDE (PF) 0.9 % IJ SOLN
INTRAMUSCULAR | Status: DC | PRN
  Administered 2024-05-23: 30 mL

## 2024-05-23 MED ORDER — ALUM & MAG HYDROXIDE-SIMETH 200-200-20 MG/5ML PO SUSP: 30.0000 mL | Freq: Four times a day (QID) | ORAL | Status: DC | PRN

## 2024-05-23 MED ORDER — ATORVASTATIN CALCIUM 10 MG PO TABS
10.0000 mg | ORAL_TABLET | Freq: Every day | ORAL | Status: DC
Start: 2024-05-23 — End: 2024-05-24
  Filled 2024-05-23 (×2): qty 1

## 2024-05-23 MED ORDER — BUPIVACAINE-EPINEPHRINE (PF) 0.5% -1:200000 IJ SOLN
INTRAMUSCULAR | Status: DC | PRN
  Administered 2024-05-23: 3 mL

## 2024-05-23 MED ORDER — LACTATED RINGERS IV SOLN: INTRAVENOUS | Status: DC | PRN

## 2024-05-23 MED ORDER — ORAL CARE MOUTH RINSE: 15.0000 mL | Freq: Once | OROMUCOSAL | Status: AC

## 2024-05-23 MED ORDER — FENTANYL CITRATE (PF) 100 MCG/2ML IJ SOLN
INTRAMUSCULAR | Status: AC
  Filled 2024-05-23: qty 2

## 2024-05-23 MED ORDER — FENTANYL CITRATE (PF) 100 MCG/2ML IJ SOLN
INTRAMUSCULAR | Status: DC | PRN
  Administered 2024-05-23 (×4): 25 ug via INTRAVENOUS

## 2024-05-23 MED ORDER — OXYCODONE HCL 5 MG/5ML PO SOLN: 5.0000 mg | Freq: Once | ORAL | Status: DC | PRN

## 2024-05-23 MED ORDER — OXYCODONE HCL 5 MG PO TABS: 5.0000 mg | ORAL_TABLET | Freq: Once | ORAL | Status: DC | PRN

## 2024-05-23 MED ORDER — DORZOLAMIDE HCL-TIMOLOL MAL 2-0.5 % OP SOLN
1.0000 [drp] | Freq: Two times a day (BID) | OPHTHALMIC | Status: DC
  Administered 2024-05-23 – 2024-05-24 (×2): 1 [drp] via OPHTHALMIC
  Filled 2024-05-23: qty 10

## 2024-05-23 MED ORDER — PROPOFOL 10 MG/ML IV BOLUS
INTRAVENOUS | Status: DC | PRN
  Administered 2024-05-23: 100 mg via INTRAVENOUS

## 2024-05-23 MED ORDER — ONDANSETRON HCL 4 MG/2ML IJ SOLN: 4.0000 mg | Freq: Once | INTRAMUSCULAR | Status: DC | PRN

## 2024-05-23 MED ORDER — ACETAMINOPHEN 10 MG/ML IV SOLN
INTRAVENOUS | Status: DC | PRN
Start: 2024-05-23 — End: 2024-05-23
  Administered 2024-05-23: 1000 mg via INTRAVENOUS

## 2024-05-23 MED ORDER — ONDANSETRON HCL 4 MG PO TABS: 4.0000 mg | ORAL_TABLET | Freq: Four times a day (QID) | ORAL | Status: DC | PRN

## 2024-05-23 MED ORDER — CEFAZOLIN SODIUM-DEXTROSE 2-4 GM/100ML-% IV SOLN
INTRAVENOUS | Status: AC
  Filled 2024-05-23: qty 100

## 2024-05-23 MED ORDER — ENOXAPARIN SODIUM 40 MG/0.4ML IJ SOSY
40.0000 mg | PREFILLED_SYRINGE | INTRAMUSCULAR | Status: DC
  Administered 2024-05-24: 40 mg via SUBCUTANEOUS
  Filled 2024-05-23: qty 0.4

## 2024-05-23 MED ORDER — SODIUM CHLORIDE 0.9 % IV SOLN: 250.0000 mL | INTRAVENOUS | Status: DC

## 2024-05-23 MED ORDER — DOCUSATE SODIUM 100 MG PO CAPS
100.0000 mg | ORAL_CAPSULE | Freq: Two times a day (BID) | ORAL | Status: DC
  Administered 2024-05-23 – 2024-05-24 (×2): 100 mg via ORAL
  Filled 2024-05-23 (×2): qty 1

## 2024-05-23 MED ORDER — LACTATED RINGERS IV SOLN: INTRAVENOUS | Status: DC

## 2024-05-23 MED ORDER — POLYETHYLENE GLYCOL 3350 17 G PO PACK: 17.0000 g | PACK | Freq: Every day | ORAL | Status: DC | PRN

## 2024-05-23 MED ORDER — KETAMINE HCL 50 MG/5ML IJ SOSY
PREFILLED_SYRINGE | INTRAMUSCULAR | Status: AC
  Filled 2024-05-23: qty 5

## 2024-05-23 MED ORDER — 0.9 % SODIUM CHLORIDE (POUR BTL) OPTIME
TOPICAL | Status: DC | PRN
  Administered 2024-05-23: 350 mL

## 2024-05-23 MED ORDER — ACETAMINOPHEN 10 MG/ML IV SOLN: 1000.0000 mg | Freq: Once | INTRAVENOUS | Status: DC | PRN

## 2024-05-23 MED ORDER — HYDROMORPHONE HCL 1 MG/ML IJ SOLN: 0.5000 mg | INTRAMUSCULAR | Status: DC | PRN

## 2024-05-23 MED ORDER — PHENYLEPHRINE 80 MCG/ML (10ML) SYRINGE FOR IV PUSH (FOR BLOOD PRESSURE SUPPORT)
PREFILLED_SYRINGE | INTRAVENOUS | Status: DC | PRN
  Administered 2024-05-23 (×5): 80 ug via INTRAVENOUS
  Administered 2024-05-23: 160 ug via INTRAVENOUS

## 2024-05-23 MED ORDER — CHLORHEXIDINE GLUCONATE 0.12 % MT SOLN
15.0000 mL | Freq: Once | OROMUCOSAL | Status: AC
  Administered 2024-05-23: 15 mL via OROMUCOSAL

## 2024-05-23 MED ORDER — LIDOCAINE HCL (CARDIAC) PF 100 MG/5ML IV SOSY
PREFILLED_SYRINGE | INTRAVENOUS | Status: DC | PRN
  Administered 2024-05-23: 50 mg via INTRAVENOUS

## 2024-05-23 MED ORDER — ESMOLOL HCL 100 MG/10ML IV SOLN
INTRAVENOUS | Status: DC | PRN
Start: 2024-05-23 — End: 2024-05-23
  Administered 2024-05-23: 10 mg via INTRAVENOUS

## 2024-05-23 MED ORDER — VITAMIN D 25 MCG (1000 UNIT) PO TABS
1000.0000 [IU] | ORAL_TABLET | Freq: Every day | ORAL | Status: DC
  Administered 2024-05-24: 1000 [IU] via ORAL
  Filled 2024-05-23: qty 1

## 2024-05-23 MED ORDER — CHLORHEXIDINE GLUCONATE 0.12 % MT SOLN
OROMUCOSAL | Status: AC
  Filled 2024-05-23: qty 15

## 2024-05-23 MED ORDER — ONDANSETRON HCL 4 MG/2ML IJ SOLN
INTRAMUSCULAR | Status: DC | PRN
  Administered 2024-05-23: 4 mg via INTRAVENOUS

## 2024-05-23 MED ORDER — ACETAMINOPHEN 10 MG/ML IV SOLN
INTRAVENOUS | Status: AC
  Filled 2024-05-23: qty 100

## 2024-05-23 MED ORDER — ONDANSETRON HCL 4 MG/2ML IJ SOLN: 4.0000 mg | Freq: Four times a day (QID) | INTRAMUSCULAR | Status: DC | PRN

## 2024-05-23 MED ORDER — SODIUM CHLORIDE 0.9% FLUSH: 3.0000 mL | INTRAVENOUS | Status: DC | PRN

## 2024-05-23 MED ORDER — METHYLPREDNISOLONE ACETATE 40 MG/ML IJ SUSP
INTRAMUSCULAR | Status: DC | PRN
  Administered 2024-05-23: 40 mg

## 2024-05-23 MED ORDER — METHOCARBAMOL 1000 MG/10ML IJ SOLN: 500.0000 mg | Freq: Four times a day (QID) | INTRAMUSCULAR | Status: DC | PRN

## 2024-05-23 MED ORDER — MAGNESIUM CITRATE PO SOLN: 1.0000 | Freq: Once | ORAL | Status: DC | PRN

## 2024-05-23 MED ORDER — CEFAZOLIN SODIUM-DEXTROSE 2-4 GM/100ML-% IV SOLN
2.0000 g | Freq: Once | INTRAVENOUS | Status: AC
  Administered 2024-05-23: 2 g via INTRAVENOUS

## 2024-05-23 MED ORDER — ACETAMINOPHEN 325 MG PO TABS: 650.0000 mg | ORAL_TABLET | ORAL | Status: DC | PRN

## 2024-05-23 MED ORDER — GLYCOPYRROLATE 0.2 MG/ML IJ SOLN
INTRAMUSCULAR | Status: DC | PRN
Start: 2024-05-23 — End: 2024-05-23
  Administered 2024-05-23: .2 mg via INTRAVENOUS

## 2024-05-23 MED ORDER — BISACODYL 5 MG PO TBEC: 5.0000 mg | DELAYED_RELEASE_TABLET | Freq: Every day | ORAL | Status: DC | PRN

## 2024-05-23 MED ORDER — OXYCODONE HCL 5 MG PO TABS: 10.0000 mg | ORAL_TABLET | ORAL | Status: DC | PRN

## 2024-05-23 MED ORDER — ACETAMINOPHEN 650 MG RE SUPP: 650.0000 mg | RECTAL | Status: DC | PRN

## 2024-05-23 MED ORDER — OXYCODONE HCL 5 MG PO TABS: 5.0000 mg | ORAL_TABLET | ORAL | Status: DC | PRN

## 2024-05-23 MED ORDER — BRIMONIDINE TARTRATE 0.2 % OP SOLN
1.0000 [drp] | Freq: Two times a day (BID) | OPHTHALMIC | Status: DC
  Administered 2024-05-23 – 2024-05-24 (×2): 1 [drp] via OPHTHALMIC
  Filled 2024-05-23: qty 5

## 2024-05-23 MED ORDER — SUGAMMADEX SODIUM 200 MG/2ML IV SOLN
INTRAVENOUS | Status: DC | PRN
Start: 2024-05-23 — End: 2024-05-23
  Administered 2024-05-23: 200 mg via INTRAVENOUS

## 2024-05-23 MED ORDER — PHENYLEPHRINE HCL-NACL 20-0.9 MG/250ML-% IV SOLN
INTRAVENOUS | Status: DC | PRN
  Administered 2024-05-23: 30 ug/min via INTRAVENOUS

## 2024-05-23 MED ORDER — ROCURONIUM BROMIDE 100 MG/10ML IV SOLN
INTRAVENOUS | Status: DC | PRN
  Administered 2024-05-23 (×2): 10 mg via INTRAVENOUS
  Administered 2024-05-23: 30 mg via INTRAVENOUS
  Administered 2024-05-23: 20 mg via INTRAVENOUS

## 2024-05-23 MED ORDER — SENNA 8.6 MG PO TABS
1.0000 | ORAL_TABLET | Freq: Two times a day (BID) | ORAL | Status: DC
  Administered 2024-05-23 – 2024-05-24 (×2): 8.6 mg via ORAL
  Filled 2024-05-23 (×2): qty 1

## 2024-05-23 MED ORDER — SURGIFLO WITH THROMBIN (HEMOSTATIC MATRIX KIT) OPTIME
TOPICAL | Status: DC | PRN
  Administered 2024-05-23: 1 via TOPICAL

## 2024-05-23 MED ORDER — ACETAMINOPHEN 500 MG PO TABS
1000.0000 mg | ORAL_TABLET | Freq: Four times a day (QID) | ORAL | Status: DC
  Administered 2024-05-23 – 2024-05-24 (×3): 1000 mg via ORAL
  Filled 2024-05-23 (×3): qty 2

## 2024-05-23 MED ORDER — PHENOL 1.4 % MT LIQD: 1.0000 | OROMUCOSAL | Status: DC | PRN

## 2024-05-23 MED ORDER — SODIUM CHLORIDE 0.9% FLUSH
3.0000 mL | Freq: Two times a day (BID) | INTRAVENOUS | Status: DC
  Administered 2024-05-23 – 2024-05-24 (×2): 3 mL via INTRAVENOUS

## 2024-05-23 MED ORDER — PANTOPRAZOLE SODIUM 40 MG IV SOLR
40.0000 mg | Freq: Every day | INTRAVENOUS | Status: DC
  Administered 2024-05-23: 40 mg via INTRAVENOUS
  Filled 2024-05-23: qty 10

## 2024-05-23 MED ORDER — FENTANYL CITRATE (PF) 100 MCG/2ML IJ SOLN: 25.0000 ug | INTRAMUSCULAR | Status: DC | PRN

## 2024-05-23 MED ORDER — MENTHOL 3 MG MT LOZG: 1.0000 | LOZENGE | OROMUCOSAL | Status: DC | PRN

## 2024-05-23 MED ORDER — CHLORHEXIDINE GLUCONATE 4 % EX SOLN: 1.0000 | CUTANEOUS | 1 refills | Status: DC

## 2024-05-23 MED ORDER — METHOCARBAMOL 500 MG PO TABS: 500.0000 mg | ORAL_TABLET | Freq: Four times a day (QID) | ORAL | Status: DC | PRN

## 2024-05-23 MED ORDER — MUPIROCIN 2 % EX OINT: 1.0000 | TOPICAL_OINTMENT | Freq: Two times a day (BID) | CUTANEOUS | 0 refills | Status: DC

## 2024-05-23 MED ORDER — GABAPENTIN 100 MG PO CAPS
100.0000 mg | ORAL_CAPSULE | Freq: Two times a day (BID) | ORAL | Status: DC
Start: 2024-05-23 — End: 2024-05-24
  Administered 2024-05-23 – 2024-05-24 (×2): 100 mg via ORAL
  Filled 2024-05-23 (×2): qty 1

## 2024-05-23 MED ORDER — DEXAMETHASONE SODIUM PHOSPHATE 10 MG/ML IJ SOLN
INTRAMUSCULAR | Status: DC | PRN
  Administered 2024-05-23: 10 mg via INTRAVENOUS

## 2024-05-23 SURGICAL SUPPLY — 28 items
BASIN KIT SINGLE STR (MISCELLANEOUS) ×1 IMPLANT
BRUSH SCRUB EZ 4% CHG (MISCELLANEOUS) ×1 IMPLANT
BUR NEURO DRILL SOFT 3.0X3.8M (BURR) ×1 IMPLANT
DERMABOND ADVANCED .7 DNX12 (GAUZE/BANDAGES/DRESSINGS) ×1 IMPLANT
DRAPE C-ARM XRAY 36X54 (DRAPES) ×2 IMPLANT
DRAPE LAPAROTOMY 100X77 ABD (DRAPES) ×1 IMPLANT
DRAPE SPINE LEICA/WILD 54X150 (DRAPES) ×1 IMPLANT
DRSG OPSITE POSTOP 3X4 (GAUZE/BANDAGES/DRESSINGS) ×1 IMPLANT
DRSG TEGADERM 4X4.75 (GAUZE/BANDAGES/DRESSINGS) IMPLANT
ELECTRODE REM PT RTRN 9FT ADLT (ELECTROSURGICAL) ×1 IMPLANT
GLOVE BIOGEL PI IND STRL 7.0 (GLOVE) ×1 IMPLANT
GLOVE BIOGEL PI IND STRL 8 (GLOVE) ×2 IMPLANT
GLOVE SURG SYN 7.0 PF PI (GLOVE) ×1 IMPLANT
GLOVE SURG SYN 7.5 PF PI (GLOVE) ×1 IMPLANT
GOWN SRG XL LVL 3 NONREINFORCE (GOWNS) ×1 IMPLANT
GOWN STRL REUS W/ TWL LRG LVL3 (GOWN DISPOSABLE) ×1 IMPLANT
KIT WILSON FRAME (KITS) ×1 IMPLANT
NDL SAFETY ECLIP 18X1.5 (MISCELLANEOUS) ×1 IMPLANT
NS IRRIG 500ML POUR BTL (IV SOLUTION) ×1 IMPLANT
PACK LAMINECTOMY ARMC (PACKS) ×1 IMPLANT
PAD ARMBOARD POSITIONER FOAM (MISCELLANEOUS) ×2 IMPLANT
SURGIFLO W/THROMBIN 8M KIT (HEMOSTASIS) ×1 IMPLANT
SUT STRATA 3-0 15 PS-2 (SUTURE) ×1 IMPLANT
SUT VIC AB 0 CT1 27XCR 8 STRN (SUTURE) ×1 IMPLANT
SUT VIC AB 2-0 CT1 18 (SUTURE) ×1 IMPLANT
SYR 30ML LL (SYRINGE) ×2 IMPLANT
SYR 3ML LL SCALE MARK (SYRINGE) ×1 IMPLANT
TRAP FLUID SMOKE EVACUATOR (MISCELLANEOUS) ×1 IMPLANT

## 2024-05-23 NOTE — Anesthesia Preprocedure Evaluation (Addendum)
 Anesthesia Evaluation  Patient identified by MRN, date of birth, ID band Patient awake    Reviewed: Allergy & Precautions, NPO status , Patient's Chart, lab work & pertinent test results  History of Anesthesia Complications Negative for: history of anesthetic complications  Airway Mallampati: I   Neck ROM: Full    Dental no notable dental hx.    Pulmonary neg pulmonary ROS   Pulmonary exam normal breath sounds clear to auscultation       Cardiovascular hypertension, Normal cardiovascular exam+ dysrhythmias Supra Ventricular Tachycardia + Valvular Problems/Murmurs MVP  Rhythm:Regular Rate:Normal  ECG 05/16/24:  Sinus tachycardia Biatrial enlargement Rightward axis Anterior infarct , age undetermined   Neuro/Psych negative neurological ROS     GI/Hepatic ,GERD  ,,  Endo/Other  Prediabetes   Renal/GU negative Renal ROS     Musculoskeletal  (+) Arthritis ,    Abdominal   Peds  Hematology negative hematology ROS (+)   Anesthesia Other Findings Cardiology note 05/16/24:  1.  Preoperative cardiovascular examination: Patient with recent finding of complex synovial cyst causing significant stenosis at L3-4, which is compressive in nature and causing significant R leg pain.  She has been seen by neurosurgery with recommendation for decompression and cyst resection.  Prior to developing right leg pain associated with her cyst, she was walking a mile daily and also was very active in her garden until mid August.  She has not experienced chest pain or dyspnea.  Peak achievable METS of 7.25.  RCRI calculates to 0.5% risk of major cardiac event in the perioperative period.  In the setting of low preop risk, no additional ischemic evaluation is warranted at this time and she may proceed to the operating room without further testing.  I did recommend that she continue statin therapy throughout the perioperative period.   2.  PSVT:  Monitoring in 2024 showed predominantly sinus rhythm with 77 brief runs of SVT lasting up to 13 seconds with an average rate of 117 bpm.  None of these were symptomatic.  Follow-up echo at that time showed normal LV function with mildly to moderately dilated left atrium and no significant valvular disease.  She denies any palpitations today.  Reserve beta-blocker therapy for the development of symptoms.   3.  White coat syndrome: Blood pressure elevated today at 140/86.  At home, she typically runs around 110 and she reports a history of orthostasis and lightheadedness, which improved when she improved her fluid intake and started liberalizing salt.  We agreed to have her continue follow-up with pressures at home.   4.  Sinus tachycardia: Heart rate elevated today in the setting of leg pain and anxiety.  Upon reevaluation of heart rate after several minutes of rest, rate was 96.  ECG otherwise unchanged from prior evaluations, with biatrial enlargement and septal infarct. No chest pain, dyspnea, or hypoxia.  She is due for lab work as part of preop eval early next week and will defer today.   5.  Hyperlipidemia:  On statin w/ LDL of 100 earlier this year.   6.  Prediabetes:  A1c of 6.0 earlier this year.  Per IM.   7.  Disposition: Follow-up in 1 year or sooner if necessary.   Reproductive/Obstetrics                              Anesthesia Physical Anesthesia Plan  ASA: 3  Anesthesia Plan: General   Post-op Pain Management:  Induction: Intravenous  PONV Risk Score and Plan: 3 and Ondansetron , Dexamethasone and Treatment may vary due to age or medical condition  Airway Management Planned: Oral ETT  Additional Equipment:   Intra-op Plan:   Post-operative Plan: Extubation in OR  Informed Consent: I have reviewed the patients History and Physical, chart, labs and discussed the procedure including the risks, benefits and alternatives for the proposed  anesthesia with the patient or authorized representative who has indicated his/her understanding and acceptance.     Dental advisory given  Plan Discussed with: CRNA  Anesthesia Plan Comments: (Patient consented for risks of anesthesia including but not limited to:  - adverse reactions to medications - damage to eyes, teeth, lips or other oral mucosa - nerve damage due to positioning  - sore throat or hoarseness - damage to heart, brain, nerves, lungs, other parts of body or loss of life  Informed patient about role of CRNA in peri- and intra-operative care.  Patient voiced understanding.)         Anesthesia Quick Evaluation

## 2024-05-23 NOTE — Plan of Care (Signed)

## 2024-05-23 NOTE — Plan of Care (Signed)
  Problem: Education: Goal: Knowledge of General Education information will improve Description: Including pain rating scale, medication(s)/side effects and non-pharmacologic comfort measures Outcome: Progressing   Problem: Safety: Goal: Ability to remain free from injury will improve Outcome: Progressing   Problem: Skin Integrity: Goal: Risk for impaired skin integrity will decrease Outcome: Progressing   Problem: Activity: Goal: Ability to avoid complications of mobility impairment will improve Outcome: Progressing

## 2024-05-23 NOTE — Anesthesia Procedure Notes (Signed)
 Procedure Name: Intubation Date/Time: 05/23/2024 1:19 PM  Performed by: Myra Lawless, CRNAPre-anesthesia Checklist: Patient identified, Patient being monitored, Timeout performed, Emergency Drugs available and Suction available Patient Re-evaluated:Patient Re-evaluated prior to induction Oxygen Delivery Method: Circle system utilized Preoxygenation: Pre-oxygenation with 100% oxygen Induction Type: IV induction Ventilation: Mask ventilation without difficulty Laryngoscope Size: Mac and 3 Grade View: Grade I Tube type: Oral Tube size: 6.5 mm Number of attempts: 1 Airway Equipment and Method: Stylet Placement Confirmation: ETT inserted through vocal cords under direct vision, positive ETCO2 and breath sounds checked- equal and bilateral Secured at: 20 cm Tube secured with: Tape Dental Injury: Teeth and Oropharynx as per pre-operative assessment

## 2024-05-23 NOTE — Transfer of Care (Signed)
 Immediate Anesthesia Transfer of Care Note  Patient: Christy [name]  Procedure(s) Performed: Right sided lumbar hemilaminectomy at L3-4 for resection of synovial cyst, open (Right)  Patient Location: PACU  Anesthesia Type:General  Level of Consciousness: drowsy  Airway & Oxygen Therapy: Patient Spontanous Breathing and Patient connected to face mask oxygen  Post-op Assessment: Report given to RN, Post -op Vital signs reviewed and stable, and Patient moving all extremities X 4  Post vital signs: Reviewed and stable  Last Vitals:  Vitals Value Taken Time  BP 151/84 05/23/24 15:55  Temp    Pulse 94 05/23/24 15:56  Resp 25 05/23/24 15:56  SpO2 100 % 05/23/24 15:56  Vitals shown include unfiled device data.  Last Pain:  Vitals:   05/23/24 1200  PainSc: 0-No pain         Complications: No notable events documented.

## 2024-05-23 NOTE — Discharge Instructions (Signed)
 Your surgeon has performed an operation on your lumbar spine (low back) to relieve pressure on one or more nerves. Many times, patients feel better immediately after surgery and can "overdo it." Even if you feel well, it is important that you follow these activity guidelines. If you do not let your back heal properly from the surgery, you can increase the chance of  complications and/or return of your symptoms. The following are instructions to help in your recovery once you have been discharged from the hospital.   Activity    No bending, lifting, or twisting ("BLT"). Avoid lifting objects heavier than 10 pounds (gallon milk jug).  Where possible, avoid household activities that involve lifting, bending, pushing, or pulling such as laundry, vacuuming, grocery shopping, and childcare. Try to arrange for help from friends and family for these activities while your back heals.  Increase physical activity slowly as tolerated.  Taking short walks is encouraged, but avoid strenuous exercise. Do not jog, run, bicycle, lift weights, or participate in any other exercises unless specifically allowed by your doctor. Avoid prolonged sitting, including car rides.  Talk to your doctor before resuming sexual activity.  You should not drive until cleared by your doctor.  Until released by your doctor, you should not return to work or school.  You should rest at home and let your body heal.   You may shower three days after your surgery.  After showering, lightly dab your incision dry. Do not take a tub bath or go swimming for 3 weeks, or until approved by your doctor at your follow-up appointment.  If you smoke, we strongly recommend that you quit.  Smoking has been proven to interfere with normal healing in your back and will dramatically reduce the success rate of your surgery. Please contact [facility] (800-QUIT-NOW) and use the resources at www.[facility].com for assistance in stopping smoking.  Surgical  Incision   If you have a dressing on your incision, you may remove it three days after your surgery. Keep your incision area clean and dry.  Your incision was closed with Dermabond glue. The glue should begin to peel away within about a week.  Diet            You may return to your usual diet. Be sure to stay hydrated.  You have been prescribed narcotic pain medications.  This often will cause constipation along with the anesthesia that you underwent.  Please obtain Colace and senna. This has been sent to your local pharmacy, but at times it is not covered by insurance and you may obtain it over the counter..  You should take a stool softener and laxative for the duration of you taking the narcotic pain medications.  When to Contact Us   Although your surgery and recovery will likely be uneventful, you may have some residual numbness, aches, and pains in your back and/or legs. This is normal and should improve in the next few weeks.  However, should you experience any of the following, contact us  immediately: New numbness or weakness Pain that is progressively getting worse, and is not relieved by your pain medications or rest Bleeding, redness, swelling, pain, or drainage from surgical incision Chills or flu-like symptoms Fever greater than 101.0 F (38.3 C) Problems with bowel or bladder functions Difficulty breathing or shortness of breath Warmth, tenderness, or swelling in your calf  Contact Information How to contact us :  If you have any questions/concerns before or after surgery, you can reach us  at [phone],  [phone], or you can send a mychart message. We can be reached by phone or mychart 8am-4pm, Monday-Friday.  *Please note: Calls after 4pm are forwarded to a third party answering service. Mychart messages are not routinely monitored during evenings, weekends, and holidays. Please call our office to contact the answering service for urgent concerns during non-business hours.

## 2024-05-23 NOTE — Op Note (Addendum)
 Indications: 78 year old woman with a history of a large right-sided synovial cyst causing severe compression of her L4 and traversing nerve roots with right lower extremity numbness and weakness.  Findings: Large compressive synovial cyst coming from the L3-4 joint on the right  Preoperative Diagnosis: L3-4 right sided synovial cyst with radiculopathy and weakness Postoperative Diagnosis: * No post-op diagnosis entered *   EBL: Minimal IVF: see anesthesia record Drains: none Disposition: Extubated and Stable to PACU Complications: none  No foley catheter was placed.  Procedure: Right L 3/4 laminectomy, medial facetectomy and resection of synovial cyst   Preoperative Note:   Risks of surgery discussed include: infection, bleeding, stroke, coma, death, paralysis, CSF leak, nerve/spinal cord injury, numbness, tingling, weakness, complex regional pain syndrome, recurrent stenosis and/or disc herniation, recurrent synovial cyst, vascular injury, development of instability, neck/back pain, need for further surgery, persistent symptoms, development of deformity, and the risks of anesthesia. They understood these risks and have agreed to proceed.  Operative Note:    The patient was then brought from the preoperative center with intravenous access established.  The patient underwent general anesthesia and endotracheal tube intubation, then was rotated on the [facility] table where all pressure points were appropriately padded.  An incision was marked with flouroscopy. The skin was then thoroughly cleansed.  Perioperative antibiotic prophylaxis was administered.  Sterile prep and drapes were then applied and a timeout was then observed.    Once this was complete a 2 cm incision was opened with the use of a #10 blade knife.  The paraspinus muscled on the right were subperiosteally dissected until the facet was visualized. Flouroscopy was used to confirm the level. A self-retaining retractor was  placed.  The microscope was then sterilely brought into the field. Using a high speed drill, the right L 3/4 laminectomy and medial facet were drilled until ligamentum flavum was visualized. After the laminoforaminotomy was performed, a curette was used to dissect the ligamentum flavum until the epidural fat and dura were visualized. The ligamentum was then carefully removed with 2mm Kerrison punch and rongeurs.  Once we were able to start to liberate the ligamentum flavum, a massive space-occupying synovial cyst mass was noted.  Majority of the internal components were evacuated, the capsule was dissected off of the dura, some of the capsule was left adherent to the dura as to avoid a CSF leak.  As the cyst was resected, the dura was then able to rebound and fill the spinal canal again.  This previously not able to be viewed from the hemilaminectomy until the cyst was partially resected and the dura came into view.  Once the underlying dura was visualized a Penfield 4 was then used to dissect and expose the traversing nerve root.  Once this was identified a nerve root retractor was used to mobilize this medially.  We examined the lateral aspect of the dural edge for any ongoing compression.  Removed all the compressive tissue.  Once the thecal sac and nerve root were noted to be relaxed and under less tension the ball-tipped feeler and [facility] were passed along the foramen distally to to ensure no residual compression was noted.  With none noted attention was then turned to closure.  Depo-Medrol was placed on the nerve root after the wound was copiously irrigated.  Hemostasis was meticulously obtained.  Long-acting anesthetic was injected into the paraspinous musculature.  The fascial layer was reapproximated with the use of a 0- Vicryl suture.  Subcutaneous tissue layer was  reapproximated using 2-0 Vicryl suture.  The skin was then cleansed and Dermabond was used to close the skin opening.   Patient was then rotated back to the preoperative bed awakened from anesthesia and taken to recovery all counts are correct in this case.  I performed the entire procedure with the assistance of Lyle Decamp PA-C as an Designer, television/film set, they helped her with exposure, retraction, protection of the neural elements, I performed the critical portions of the procedure myself.   Penne MICAEL Sharps, MD

## 2024-05-23 NOTE — Interval H&P Note (Signed)
 History and Physical Interval Note:  05/23/2024 12:26 PM  Christy [name]  has presented today for surgery, with the diagnosis of L3-4 right sided synovial cyst with radiculopathy and weakness.  The various methods of treatment have been discussed with the patient and family. After consideration of risks, benefits and other options for treatment, the patient has consented to  Procedure(s): Right sided lumbar hemilaminectomy at L3-4 for resection of synovial cyst, open (Right) as a surgical intervention.  The patient's history has been reviewed, patient examined, no change in status, stable for surgery.  I have reviewed the patient's chart and labs.  Questions were answered to the patient's satisfaction.    Heart and lungs clear   Penne LELON Sharps

## 2024-05-23 NOTE — Progress Notes (Signed)
 Sent to pharmacy for eyedrops.

## 2024-05-24 ENCOUNTER — Other Ambulatory Visit: Payer: Self-pay

## 2024-05-24 ENCOUNTER — Encounter: Payer: Self-pay | Admitting: Neurosurgery

## 2024-05-24 DIAGNOSIS — M5416 Radiculopathy, lumbar region: Secondary | ICD-10-CM | POA: Diagnosis not present

## 2024-05-24 DIAGNOSIS — M810 Age-related osteoporosis without current pathological fracture: Secondary | ICD-10-CM | POA: Diagnosis not present

## 2024-05-24 DIAGNOSIS — M7138 Other bursal cyst, other site: Secondary | ICD-10-CM | POA: Diagnosis not present

## 2024-05-24 MED ORDER — MUPIROCIN 2 % EX OINT
TOPICAL_OINTMENT | Freq: Two times a day (BID) | CUTANEOUS | Status: DC
  Filled 2024-05-24: qty 22

## 2024-05-24 MED ORDER — TIZANIDINE HCL 2 MG PO TABS
2.0000 mg | ORAL_TABLET | Freq: Three times a day (TID) | ORAL | 0 refills | Status: DC | PRN
  Filled 2024-05-24: qty 30, 10d supply, fill #0

## 2024-05-24 MED ORDER — ACETAMINOPHEN 500 MG PO TABS
1000.0000 mg | ORAL_TABLET | Freq: Three times a day (TID) | ORAL | 0 refills | Status: AC
  Filled 2024-05-24: qty 42, 7d supply, fill #0

## 2024-05-24 MED ORDER — SENNA 8.6 MG PO TABS
1.0000 | ORAL_TABLET | Freq: Every day | ORAL | 0 refills | Status: DC
  Filled 2024-05-24: qty 14, 14d supply, fill #0

## 2024-05-24 MED ORDER — CHLORHEXIDINE GLUCONATE 4 % EX SOLN
1.0000 | CUTANEOUS | 1 refills | Status: AC
  Filled 2024-05-24: qty 946, fill #0

## 2024-05-24 MED ORDER — OXYCODONE HCL 5 MG PO TABS
5.0000 mg | ORAL_TABLET | Freq: Four times a day (QID) | ORAL | 0 refills | Status: AC | PRN
  Filled 2024-05-24: qty 28, 7d supply, fill #0

## 2024-05-24 MED ORDER — MUPIROCIN 2 % EX OINT
1.0000 | TOPICAL_OINTMENT | Freq: Two times a day (BID) | CUTANEOUS | 0 refills | Status: AC
  Filled 2024-05-24: qty 22, 30d supply, fill #0

## 2024-05-24 MED ORDER — DOCUSATE SODIUM 100 MG PO CAPS
100.0000 mg | ORAL_CAPSULE | Freq: Two times a day (BID) | ORAL | 0 refills | Status: DC
  Filled 2024-05-24: qty 28, 14d supply, fill #0

## 2024-05-24 NOTE — Evaluation (Signed)
 Physical Therapy Evaluation Patient Details Name: Christy [name] MRN: [id number] DOB: 07/13/1946 Today's Date: 05/24/2024  History of Present Illness  Pt is a 78 y/o F admitted on 05/23/24 for scheduled R L3-4 laminectomy, medial facetectomy & resection of synovial cyst. PMH: arthritis, atypical chest pain, cystocele, GERD, glaucoma, heart murmur, HLD, lumbar radiculopathy, pre-diabetes, PSVT, rectocele  Clinical Impression  Pt seen for PT evaluation with pt agreeable, daughter present in room. Prior to admission pt was living alone at [facility], recently started using rollator 2/2 back issues/pain but prior to that was independent without AD. On this date, pt is able to complete transfers with supervision, ambulate without AD but reaching for furniture with CGA, ambulate with RW & supervision. Pt reports comfort with rollator she's used to & was using prior to sx. Will continue to follow pt acutely to address balance, endurance, strengthening & gait with LRAD.        If plan is discharge home, recommend the following: Assistance with cooking/housework;Assist for transportation;Help with stairs or ramp for entrance   Can travel by private vehicle        Equipment Recommendations None recommended by PT  Recommendations for Other Services       Functional Status Assessment Patient has had a recent decline in their functional status and demonstrates the ability to make significant improvements in function in a reasonable and predictable amount of time.     Precautions / Restrictions Precautions Precautions: Fall;Back Restrictions Weight Bearing Restrictions Per Provider Order: No      Mobility  Bed Mobility               General bed mobility comments: not tested, pt received & left sitting recliner    Transfers Overall transfer level: Needs assistance Equipment used: None Transfers: Sit to/from Stand Sit to Stand: Supervision                 Ambulation/Gait Ambulation/Gait assistance: Contact guard assist, Supervision Gait Distance (Feet): 150 Feet Assistive device: None, Rolling walker (2 wheels) Gait Pattern/deviations: Step-through pattern, Decreased step length - right, Decreased step length - left, Decreased dorsiflexion - right, Decreased stride length, Decreased dorsiflexion - left Gait velocity: decreased     General Gait Details: attempts gait without AD with CGA, pt intermittently reaching for furniture for support, encouraged/educate pt on use of RW & pt able to ambulate around nurses station with RW & supervision  Stairs            Wheelchair Mobility     Tilt Bed    Modified Rankin (Stroke Patients Only)       Balance Overall balance assessment: Needs assistance Sitting-balance support: Feet supported Sitting balance-Leahy Scale: Good     Standing balance support: During functional activity, No upper extremity supported Standing balance-Leahy Scale: Fair                               Pertinent Vitals/Pain Pain Assessment Pain Assessment: No/denies pain    Home Living Family/patient expects to be discharged to:: Private residence Living Arrangements: Alone Available Help at Discharge: Family (family planning to stay with her the next 1.5-2 weeks) Type of Home:  ([facility])         Home Layout: One level Home Equipment: Rollator (4 wheels);Grab bars - tub/shower;Standard Environmental consultant Additional Comments: 1 level duplex, has a curb to step down to her car.  Prior Function Prior Level of Function : Independent/Modified Independent             Mobility Comments: Started using rollator about a month ago 2/2 pain/back issues, prior to this was independent without AD, denies falls, driving. ADLs Comments: Independent     Extremity/Trunk Assessment   Upper Extremity Assessment Upper Extremity Assessment: Overall WFL for tasks assessed    Lower Extremity  Assessment Lower Extremity Assessment: RLE deficits/detail RLE Deficits / Details: R hip flexor weakness (but pt & family report it's improved compared to prior to sx)       Communication   Communication Communication: No apparent difficulties    Cognition Arousal: Alert Behavior During Therapy: WFL for tasks assessed/performed   PT - Cognitive impairments: No apparent impairments                       PT - Cognition Comments: very pleasant lady Following commands: Intact       Cueing Cueing Techniques: Verbal cues     General Comments      Exercises     Assessment/Plan    PT Assessment Patient needs continued PT services  PT Problem List Decreased coordination;Decreased activity tolerance;Decreased balance;Decreased mobility;Decreased knowledge of precautions;Decreased safety awareness;Decreased knowledge of use of DME       PT Treatment Interventions DME instruction;Balance training;Gait training;Neuromuscular re-education;Stair training;Functional mobility training;Therapeutic activities;Therapeutic exercise;Patient/family education    PT Goals (Current goals can be found in the Care Plan section)  Acute Rehab PT Goals Patient Stated Goal: get better, recover then resume PT PT Goal Formulation: With patient Time For Goal Achievement: 06/07/24 Potential to Achieve Goals: Good    Frequency Min 2X/week     Co-evaluation               AM-PAC PT 6 Clicks Mobility  Outcome Measure Help needed turning from your back to your side while in a flat bed without using bedrails?: None Help needed moving from lying on your back to sitting on the side of a flat bed without using bedrails?: A Little Help needed moving to and from a bed to a chair (including a wheelchair)?: A Little Help needed standing up from a chair using your arms (e.g., wheelchair or bedside chair)?: A Little Help needed to walk in hospital room?: A Little Help needed climbing 3-5  steps with a railing? : A Little 6 Click Score: 19    End of Session   Activity Tolerance: Patient tolerated treatment well Patient left: in chair;with call bell/phone within reach;with family/visitor present   PT Visit Diagnosis: Unsteadiness on feet (R26.81);Muscle weakness (generalized) (M62.81);Other abnormalities of gait and mobility (R26.89)    Time: 9066-9056 PT Time Calculation (min) (ACUTE ONLY): 10 min   Charges:   PT Evaluation $PT Eval Low Complexity: 1 Low   PT General Charges $$ ACUTE PT VISIT: 1 Visit         Richerd Pinal, PT, DPT 05/24/24, 9:54 AM   Richerd CHRISTELLA Pinal 05/24/2024, 9:53 AM

## 2024-05-24 NOTE — Evaluation (Signed)
 Occupational Therapy Evaluation Patient Details Name: Christy [name] MRN: [id number] DOB: Jul 07, 1946 Today's Date: 05/24/2024   History of Present Illness   Pt is a 78 y/o F admitted on 05/23/24 for scheduled R L3-4 laminectomy, medial facetectomy & resection of synovial cyst. PMH: arthritis, atypical chest pain, cystocele, GERD, glaucoma, heart murmur, HLD, lumbar radiculopathy, pre-diabetes, PSVT, rectocele     Clinical Impressions Christy [name] was seen for OT evaluation this date. Prior to hospital admission, pt was generally independent with ADL/IADL management. Pt lives in the [place] at [facility] and was seeing OP PT for back pain prior to her sx. Pt presents with deficits in strength, balance, and LB access in setting of her recent back surgery, affecting safe and optimal ADL completion. Pt currently requires SUPERVISION for safety for functional transfers and LB ADL management from STS.  Pt would benefit from skilled OT services to address noted impairments and functional limitations (see below for any additional details) in order to maximize safety and independence while minimizing future risk of falls, injury, and readmission. Do not anticipate the need for follow up OT services upon acute hospital DC.      If plan is discharge home, recommend the following:   A little help with bathing/dressing/bathroom;A little help with walking and/or transfers;Assist for transportation;Help with stairs or ramp for entrance;Assistance with cooking/housework;Assistance with feeding     Functional Status Assessment   Patient has had a recent decline in their functional status and demonstrates the ability to make significant improvements in function in a reasonable and predictable amount of time.     Equipment Recommendations   None recommended by OT     Recommendations for Other Services         Precautions/Restrictions   Precautions Precautions: Fall;Back Restrictions Weight  Bearing Restrictions Per Provider Order: No     Mobility Bed Mobility Overal bed mobility: Modified Independent             General bed mobility comments: good safety awareness and independent use of log roll technique.    Transfers Overall transfer level: Needs assistance Equipment used: Rolling walker (2 wheels) Transfers: Sit to/from Stand Sit to Stand: Supervision                  Balance Overall balance assessment: Needs assistance Sitting-balance support: Feet supported, No upper extremity supported Sitting balance-Leahy Scale: Good Sitting balance - Comments: steady with static/dynamic sitting balance during ADL management.   Standing balance support: During functional activity, Bilateral upper extremity supported, Reliant on assistive device for balance Standing balance-Leahy Scale: Fair                             ADL either performed or assessed with clinical judgement   ADL Overall ADL's : Needs assistance/impaired                     Lower Body Dressing: Set up;Supervision/safety;Sitting/lateral leans;Sit to/from stand Lower Body Dressing Details (indicate cue type and reason): educated on compensatory ADL management strategies for improved adherence to back precautions. Toilet Transfer: Supervision/safety;Set up;Rolling walker (2 wheels) Toilet Transfer Details (indicate cue type and reason): simulated to chair.         Functional mobility during ADLs: Set up;Supervision/safety;Rolling walker (2 wheels)       Vision Baseline Vision/History: 1 Wears glasses Patient Visual Report: No change from baseline       Perception  Praxis         Pertinent Vitals/Pain Pain Assessment Pain Assessment: No/denies pain     Extremity/Trunk Assessment Upper Extremity Assessment Upper Extremity Assessment: Overall WFL for tasks assessed   Lower Extremity Assessment Lower Extremity Assessment: Overall WFL for tasks  assessed;Defer to PT evaluation RLE Deficits / Details: R hip flexor weakness (but pt & family report it's improved compared to prior to sx)   Cervical / Trunk Assessment Cervical / Trunk Assessment: Normal   Communication Communication Communication: No apparent difficulties   Cognition Arousal: Alert Behavior During Therapy: WFL for tasks assessed/performed                                 Following commands: Intact       Cueing  General Comments   Cueing Techniques: Verbal cues      Exercises Other Exercises Other Exercises: Pt/caregiver educated on role of OT in acute setting, safety, falls prevention strategies for home and hospital and compensatory ADL management strategies in setting of recent back surgery and back precautions for comfort.   Shoulder Instructions      Home Living Family/patient expects to be discharged to:: Other (Comment) ([facility]) Living Arrangements: Alone Available Help at Discharge: Family (family planning to stay with her the next 1.5-2 weeks) Type of Home:  ([facility] [place])       Home Layout: One level     Bathroom Shower/Tub: Producer, television/film/video:  (handles on toilet)     Home Equipment: Rollator (4 wheels);Grab bars - tub/shower;Standard Environmental consultant   Additional Comments: 1 level duplex, has a curb to step down to her car.      Prior Functioning/Environment Prior Level of Function : Independent/Modified Independent             Mobility Comments: Using rollator, was doing PT at [facility]. ADLs Comments: Independent, driving, grocery shopping, etc. Denies falls history.    OT Problem List: Decreased activity tolerance;Decreased safety awareness;Decreased knowledge of use of DME or AE;Decreased knowledge of precautions;Impaired balance (sitting and/or standing);Decreased coordination   OT Treatment/Interventions: Self-care/ADL training;Therapeutic exercise;Therapeutic activities;DME  and/or AE instruction;Patient/family education;Balance training;Energy conservation      OT Goals(Current goals can be found in the care plan section)   Acute Rehab OT Goals Patient Stated Goal: To go home and get back to gardening OT Goal Formulation: With patient Time For Goal Achievement: 06/07/24 Potential to Achieve Goals: Good ADL Goals Pt Will Perform Grooming: with modified independence;standing Pt Will Perform Lower Body Dressing: sitting/lateral leans;with modified independence;sit to/from stand;with adaptive equipment Pt Will Transfer to Toilet: with modified independence;regular height toilet;ambulating Pt Will Perform Toileting - Clothing Manipulation and hygiene: with adaptive equipment;sitting/lateral leans;with modified independence   OT Frequency:  Min 2X/week    Co-evaluation              AM-PAC OT 6 Clicks Daily Activity     Outcome Measure Help from another person eating meals?: None Help from another person taking care of personal grooming?: None Help from another person toileting, which includes using toliet, bedpan, or urinal?: A Little Help from another person bathing (including washing, rinsing, drying)?: A Little Help from another person to put on and taking off regular upper body clothing?: None Help from another person to put on and taking off regular lower body clothing?: A Little 6 Click Score: 21   End of Session  Equipment Utilized During Treatment: Rolling walker (2 wheels);Gait belt  Activity Tolerance: Patient tolerated treatment well Patient left: in chair;with call bell/phone within reach;with chair alarm set;with family/visitor present  OT Visit Diagnosis: Other abnormalities of gait and mobility (R26.89)                Time: 9142-9070 OT Time Calculation (min): 32 min Charges:  OT General Charges $OT Visit: 1 Visit OT Evaluation $OT Eval Low Complexity: 1 Low OT Treatments $Self Care/Home Management : 8-22 mins  Jhonny Pelton, M.S., OTR/L 05/24/24, 12:03 PM

## 2024-05-24 NOTE — Telephone Encounter (Signed)
 Spoke with pt on 05/22/24 and she would like to defer this injection due to having back surgery on 05/23/24.

## 2024-05-24 NOTE — Discharge Summary (Signed)
 [facility] Discharge Summary   Patient: Christy [name] FMW:[id number] DOB: 05-26-1946 PCP: Gretta Comer POUR, NP  Date of admission: 05/23/2024  Date of Surgery: 05/23/2024  Surgery: Procedure(s) (LRB): Right sided lumbar hemilaminectomy at L3-4 for resection of synovial cyst, open (Right)  Date of discharge:  05/24/24 1 Day Post-Op  Discharge Condition: Good  Recommendations at discharge:  Follow up as instructed, call if any issues noted on your postop AVS  Discharge Diagnoses: Principal Problem:   Synovial cyst of lumbar spine Active Problems:   Right leg weakness   Lumbar radiculopathy, right  Resolved Problems:   * [name] resolved hospital problems. [facility] Course: Right sided lumbar hemilaminectomy at L3-4 for resection of synovial cyst, open for right lower extremity weakness. They were admitted to the general care floor postoperatively.  They did well overnight and had improved leg pain and lower extremity weakness.   Consultants: PT/OT Additional Procedures: None  Exam: Vitals:   05/24/24 0329  BP: (!) 108/45  Pulse: 65  Resp: 18  Temp: 98 F (36.7 C)  SpO2: 98%   The physical exam is generally normal. Patient appears well, alert and oriented x 3, pleasant, cooperative. Vitals are as noted. Abdomen is soft, [name] tenderness. Extremities are normal.  Neurological exam is improved from baseline, with improved right hip flexion and extension. Incision is CDI.   The results of significant diagnostics from this hospitalization (including imaging, microbiology, ancillary and laboratory) are listed below for reference.  Imaging Studies: DG Lumbar Spine 2-3 Views Result Date: 05/23/2024 EXAM: 2 or 3 VIEW(S) XRAY OF THE LUMBAR SPINE 05/23/2024 03:35:24 PM COMPARISON: None available. CLINICAL HISTORY: [id number] Surgery, elective [id number]. Surgery, elective [id number]. FINDINGS: Intraprocedural fluoroscopic images without a radiologist present for localization  purposes. Fluoroscopy time: 5 seconds. Radiation dose: 0.8828 mGy IMPRESSION: 1. Intra-procedural localization images. Electronically signed by: Norman Gatlin MD 05/23/2024 08:21 PM EDT RP Workstation: [id number]   DG C-Arm 1-60 Min-[name] Report Result Date: 05/23/2024 Fluoroscopy was utilized by the requesting physician.  [name] radiographic interpretation.   DG C-Arm 1-60 Min-[name] Report Result Date: 05/23/2024 Fluoroscopy was utilized by the requesting physician.  [name] radiographic interpretation.   DG Lumbar Spine Complete Result Date: 05/20/2024 CLINICAL DATA:  Low back pain EXAM: LUMBAR SPINE - COMPLETE 4+ VIEW COMPARISON:  04/12/2024 FINDINGS: There is [name] evidence of lumbar spine fracture. Lumbar levocurvature. [name] significant listhesis within the sagittal plane. [name] dynamic instability upon flexion or extension. Multilevel asymmetric disc space narrowing compensatory to the curvature. Lower lumbar facet arthropathy. IMPRESSION: Lumbar levocurvature with multilevel degenerative changes. [name] acute findings. Electronically Signed   By: Mabel Converse D.O.   On: 05/20/2024 11:40   MR Lumbar Spine Wo Contrast Result Date: 05/09/2024 CLINICAL DATA:  Low back pain and right lower extremity radiculopathy. EXAM: MRI LUMBAR SPINE WITHOUT CONTRAST TECHNIQUE: Multiplanar, multisequence MR imaging of the lumbar spine was performed. [name] intravenous contrast was administered. COMPARISON:  Radiographs 04/12/2024 FINDINGS: Segmentation: There are five lumbar type vertebral bodies. The last full intervertebral disc space is labeled L5-S1. This correlates with the radiographs. Alignment: Left convex lumbar scoliosis but normal overall alignment in the sagittal plane. Vertebrae:  Normal marrow signal.  [name] bone lesions or fractures. Conus medullaris and cauda equina: Conus extends to the T12-L1 level. Conus and cauda equina appear normal. Paraspinal and other soft tissues: [name] significant paraspinal or retroperitoneal findings.  Simple renal cysts. Prominent extra renal pelvis on the left side. Disc levels: T12-L1: Bilateral perineural cysts or  dilated nerve root sheaths. [name] disc protrusions, spinal or foraminal stenosis. L1-2: Mild asymmetric left-sided facet disease and ligamentum flavum thickening but [name] disc protrusions, spinal or foraminal stenosis. L2-3: Diffuse bulging annulus, osteophytic ridging and facet disease contributing to mild spinal stenosis and moderate bilateral lateral recess stenosis, right greater than left. Mild left foraminal narrowing due to a broad-based foraminal/extraforaminal disc osteophyte complex. L3-4: There is a large complex synovial cyst extruding into the spinal canal and exerting significant mass effect on the right side of the thecal sac. It also appears to contact the exiting right L3 nerve root medially and is significantly compressing the right L4 nerve root in the lateral recess. L4-5: Degenerative disc disease and facet disease with mild to moderate bilateral lateral recess stenosis but [name] significant spinal stenosis. There is right foraminal narrowing due to a right-sided disc osteophyte complex. L5-S1: Degenerative disc disease and facet disease but [name] disc protrusions, spinal or lateral recess stenosis. Mild left foraminal narrowing due to a left-sided disc osteophyte complex. IMPRESSION: 1. Large (16 x 14 mm) right-sided complex synovial cyst extruding into the spinal canal at L3-4 and exerting significant mass effect on the right side of the thecal sac. It also appears to contact the exiting right L3 nerve root medially and is significantly compressing the right L4 nerve root in the lateral recess. 2. Mild spinal stenosis and moderate bilateral lateral recess stenosis, right greater than left at L2-3. Mild left foraminal narrowing due to a broad-based foraminal/extraforaminal disc osteophyte complex. 3. Mild to moderate bilateral lateral recess stenosis at L4-5. There is right foraminal  narrowing due to a right-sided disc osteophyte complex. 4. Mild left foraminal narrowing at L5-S1 due to a left-sided disc osteophyte complex. Electronically Signed   By: MYRTIS Stammer M.D.   On: 05/09/2024 18:27    Microbiology: Results for orders placed or performed during the hospital encounter of 05/17/24  Surgical pcr screen     Status: Abnormal   Collection Time: 05/17/24 12:49 PM   Specimen: Nasal Mucosa; Nasal Swab  Result Value Ref Range Status   MRSA, PCR NEGATIVE NEGATIVE Final   Staphylococcus aureus POSITIVE (A) NEGATIVE Final    Comment: (NOTE) The Xpert SA Assay (FDA approved for NASAL specimens in patients 6 years of age and older), is one component of a comprehensive surveillance program. It is not intended to diagnose infection nor to guide or monitor treatment. Performed at [facility], [address] [place], [place] [address]     Labs:  CBC: Recent Labs  Lab 05/17/24 1249  WBC 8.7  HGB 14.1  HCT 41.4  MCV 97.4  PLT 233   Basic Metabolic Panel: Recent Labs  Lab 05/17/24 1249  NA 139  K 3.9  CL 102  CO2 25  GLUCOSE 106*  BUN 30*  CREATININE 0.53  CALCIUM  9.1   CBG: [name] results for input(s): GLUCAP in the last 168 hours.  Time spent: 35 minutes  Author: Penne LELON Sharps, MD  [facility]

## 2024-05-27 ENCOUNTER — Ambulatory Visit: Payer: Self-pay | Admitting: Neurosurgery

## 2024-05-27 LAB — SURGICAL PATHOLOGY

## 2024-06-05 ENCOUNTER — Ambulatory Visit (INDEPENDENT_AMBULATORY_CARE_PROVIDER_SITE_OTHER): Admitting: Physician Assistant

## 2024-06-05 ENCOUNTER — Encounter: Payer: Self-pay | Admitting: Physician Assistant

## 2024-06-05 VITALS — BP 136/82 | Temp 98.1°F | Ht 64.0 in | Wt 114.2 lb

## 2024-06-05 DIAGNOSIS — M7138 Other bursal cyst, other site: Secondary | ICD-10-CM

## 2024-06-05 DIAGNOSIS — Z09 Encounter for follow-up examination after completed treatment for conditions other than malignant neoplasm: Secondary | ICD-10-CM

## 2024-06-05 NOTE — Progress Notes (Signed)
   REFERRING PHYSICIAN:  Gretta Comer POUR, [address] [name],  [place] [address]  DOS: 05/23/24, right sided lumbar hemilaminectomy and L3/4 for resection of synovial cyst  HISTORY OF PRESENT ILLNESS: Christy [name] is approximately 2 weeks status post above surgery. she is doing well.  Her pain has improved quite a bit.  She does have some residual numbness and weakness in her right thigh.  She is mainly using gabapentin  for her nerve pain.    PHYSICAL EXAMINATION:  General: Patient is well developed, well nourished, calm, collected, and in no apparent distress.   NEUROLOGICAL:  General: In no acute distress.   Awake, alert, oriented to person, place, and time.  Pupils equal round and reactive to light.  Facial tone is symmetric.     Strength:            Side Iliopsoas Quads Hamstring PF DF EHL  R 4 5 5 5 5 5   L 5 5 5 5 5 5    Incision c/d/i   ROS (Neurologic):  Negative except as noted above  Surgical pathology:  FINAL DIAGNOSIS        1. Synovium, cyst, right side L3-4 joint :       DENSE FIRM CONNECTIVE TISSUE FOCALLY LINED BY REACTIVE SYNOVIUM WITH CHRONIC       INFLAMMATION WITH ASSOCIATED CYST CONTENTS COMPATIBLE WITH A SYNOVIAL CYST       ADDITIONAL FRAGMENTS OF REACTIVE BONE AND HYALINE CARTILAGE    IMAGING: No new imaging.  ASSESSMENT/PLAN:  Christy [name] is doing well approximately 2 weeks after right sided lumbar hemilaminectomy at L3-4 for resection of synovial cyst on 05/23/2024. she will follow up in 1 month for her 6-week postop visit I have advised the patient to lift up to 10 pounds until 6 weeks after surgery, then increase up to 25 pounds until 12 weeks after surgery.  After 12 weeks post-op, the patient advised to increase activity as tolerated.  Advised patient to continue gabapentin .  We will work on weaning this at around 6 weeks postop if she would like to do that.    Advised to contact the office if any questions or concerns  arise.  Lyle Decamp PA-C Department of neurosurgery

## 2024-06-08 NOTE — Anesthesia Postprocedure Evaluation (Signed)
 Anesthesia Post Note  Patient: Christy [name]  Procedure(s) Performed: Right sided lumbar hemilaminectomy at L3-4 for resection of synovial cyst, open (Right)  Patient location during evaluation: PACU Anesthesia Type: General Level of consciousness: awake and alert Pain management: pain level controlled Vital Signs Assessment: post-procedure vital signs reviewed and stable Respiratory status: spontaneous breathing, nonlabored ventilation, respiratory function stable and patient connected to nasal cannula oxygen Cardiovascular status: blood pressure returned to baseline and stable Postop Assessment: no apparent nausea or vomiting Anesthetic complications: no   No notable events documented.   Last Vitals:  Vitals:   05/24/24 0329 05/24/24 0749  BP: (!) 108/45 121/70  Pulse: 65 77  Resp: 18 18  Temp: 36.7 C (!) 36.4 C  SpO2: 98% 99%    Last Pain:  Vitals:   05/24/24 0737  TempSrc:   PainSc: 0-No pain                 Prentice Murphy

## 2024-06-14 DIAGNOSIS — Z23 Encounter for immunization: Secondary | ICD-10-CM | POA: Diagnosis not present

## 2024-06-27 ENCOUNTER — Other Ambulatory Visit: Payer: Self-pay | Admitting: *Deleted

## 2024-06-27 DIAGNOSIS — M810 Age-related osteoporosis without current pathological fracture: Secondary | ICD-10-CM

## 2024-07-03 ENCOUNTER — Encounter: Payer: Self-pay | Admitting: Neurosurgery

## 2024-07-03 ENCOUNTER — Encounter: Admitting: Neurosurgery

## 2024-07-03 ENCOUNTER — Ambulatory Visit (INDEPENDENT_AMBULATORY_CARE_PROVIDER_SITE_OTHER): Admitting: Neurosurgery

## 2024-07-03 VITALS — BP 128/70 | Temp 98.7°F | Ht 64.0 in | Wt 116.2 lb

## 2024-07-03 DIAGNOSIS — M7138 Other bursal cyst, other site: Secondary | ICD-10-CM

## 2024-07-03 DIAGNOSIS — Z09 Encounter for follow-up examination after completed treatment for conditions other than malignant neoplasm: Secondary | ICD-10-CM

## 2024-07-03 DIAGNOSIS — R29898 Other symptoms and signs involving the musculoskeletal system: Secondary | ICD-10-CM

## 2024-07-03 DIAGNOSIS — M5416 Radiculopathy, lumbar region: Secondary | ICD-10-CM

## 2024-07-03 MED ORDER — GABAPENTIN 100 MG PO CAPS
100.0000 mg | ORAL_CAPSULE | Freq: Two times a day (BID) | ORAL | 0 refills | Status: DC
Start: 2024-07-03 — End: 2024-07-09

## 2024-07-03 NOTE — Progress Notes (Signed)
   REFERRING PHYSICIAN:  Gretta Comer POUR, [address] [name],  [place] [address]  DOS: 05/23/24, right sided lumbar hemilaminectomy and L3/4 for resection of synovial cyst  Discussed the use of AI scribe software for clinical note transcription with the patient, who gave verbal consent to proceed.  History of Present Illness Christy [name] is a 78 year old female who presents for follow-up regarding post-surgical symptoms after lumbar cyst removal. She is accompanied by her son, Christy [name].  She is six weeks post-surgery for a large lumbar synovial cyst. Pain is resolved, but numbness persists in her calf and foot, more noticeable at certain times. She feels less sure-footed, especially in the dark or on uneven ground, and uses a walking stick outside for balance.  She inquires about activity restrictions, specifically regarding lifting, bending, and twisting movements.  She takes gabapentin  100 mg in the morning and 100 mg at night without side effects and needs a refill soon.    PHYSICAL EXAMINATION:  General: Patient is well developed, well nourished, calm, collected, and in no apparent distress.   NEUROLOGICAL:  General: In no acute distress.   Awake, alert, oriented to person, place, and time.  Pupils equal round and reactive to light.  Facial tone is symmetric.     Strength:            Side Iliopsoas Quads Hamstring PF DF EHL  R 4 5 5 5 5 5   L 5 5 5 5 5 5    Incision c/d/i   ROS (Neurologic):  Negative except as noted above  Surgical pathology:  FINAL DIAGNOSIS        1. Synovium, cyst, right side L3-4 joint :       DENSE FIRM CONNECTIVE TISSUE FOCALLY LINED BY REACTIVE SYNOVIUM WITH CHRONIC       INFLAMMATION WITH ASSOCIATED CYST CONTENTS COMPATIBLE WITH A SYNOVIAL CYST       ADDITIONAL FRAGMENTS OF REACTIVE BONE AND HYALINE CARTILAGE    IMAGING: No new imaging.  Assessment and Plan Assessment & Plan Postoperative management of L3-4 right-sided synovial  cyst with lumbar radiculopathy Six weeks post-surgery with significant improvement in pain and strength. Residual numbness in right calf and foot persists due to sensory nerve recovery challenges. No current pain. Incision well-healed. - Continue gabapentin  100 mg twice daily until 12-week follow-up. - Refilled gabapentin  prescription for 44 days to cover until next appointment. - Avoid activities involving bending, lifting, and twisting simultaneously to prevent cyst recurrence. - Encouraged Tai Chi for balance and sensory management. - Advised to use a walking stick for stability in low-light or uneven terrain. - Instructed to monitor for recurrence of back or leg pain and contact clinic if symptoms return.  Residual numbness in right calf and foot after lumbar nerve compression Persistent numbness in right calf and foot following lumbar nerve compression due to synovial cyst. Sensory nerve recovery is often incomplete, leading to residual numbness. Numbness affects balance and confidence in walking, especially in low-light or uneven conditions. - Continue using a walking stick for stability. - Engage in Tai Chi to improve balance and manage numbness. - Monitor for any changes in sensation or function. - Consider PT and gait training   Penne MICAEL Sharps, MD [facility] neurosurgery [place]

## 2024-07-08 ENCOUNTER — Other Ambulatory Visit (INDEPENDENT_AMBULATORY_CARE_PROVIDER_SITE_OTHER)

## 2024-07-08 ENCOUNTER — Other Ambulatory Visit: Payer: Self-pay | Admitting: Neurosurgery

## 2024-07-08 ENCOUNTER — Ambulatory Visit: Payer: Self-pay | Admitting: Primary Care

## 2024-07-08 DIAGNOSIS — M5416 Radiculopathy, lumbar region: Secondary | ICD-10-CM

## 2024-07-08 DIAGNOSIS — M7138 Other bursal cyst, other site: Secondary | ICD-10-CM

## 2024-07-08 DIAGNOSIS — M810 Age-related osteoporosis without current pathological fracture: Secondary | ICD-10-CM

## 2024-07-08 LAB — BASIC METABOLIC PANEL WITH GFR
BUN: 25 mg/dL — ABNORMAL HIGH (ref 6–23)
CO2: 28 meq/L (ref 19–32)
Calcium: 9.2 mg/dL (ref 8.4–10.5)
Chloride: 99 meq/L (ref 96–112)
Creatinine, Ser: 0.54 mg/dL (ref 0.40–1.20)
GFR: 87.43 mL/min (ref >60.00)
Glucose, Bld: 89 mg/dL (ref 70–99)
Potassium: 3.7 meq/L (ref 3.5–5.1)
Sodium: 136 meq/L (ref 135–145)

## 2024-07-09 ENCOUNTER — Other Ambulatory Visit: Payer: Self-pay | Admitting: Neurosurgery

## 2024-07-09 DIAGNOSIS — M5416 Radiculopathy, lumbar region: Secondary | ICD-10-CM

## 2024-07-09 DIAGNOSIS — M7138 Other bursal cyst, other site: Secondary | ICD-10-CM

## 2024-07-11 ENCOUNTER — Ambulatory Visit

## 2024-07-11 DIAGNOSIS — M810 Age-related osteoporosis without current pathological fracture: Secondary | ICD-10-CM

## 2024-07-11 MED ORDER — DENOSUMAB 60 MG/ML ~~LOC~~ SOSY: 60.0000 mg | PREFILLED_SYRINGE | SUBCUTANEOUS | Status: AC

## 2024-07-11 NOTE — Progress Notes (Signed)
 Per orders of [employer] DPN AGNP-C, injection of Prolia   given by Laray Arenas in  Left arm Patient tolerated injection well. Patient  informed office will make appointment for 6 month.

## 2024-07-29 DIAGNOSIS — Z961 Presence of intraocular lens: Secondary | ICD-10-CM | POA: Diagnosis not present

## 2024-07-29 DIAGNOSIS — H3022 Posterior cyclitis, left eye: Secondary | ICD-10-CM | POA: Diagnosis not present

## 2024-07-29 DIAGNOSIS — H40001 Preglaucoma, unspecified, right eye: Secondary | ICD-10-CM | POA: Diagnosis not present

## 2024-07-29 DIAGNOSIS — H401122 Primary open-angle glaucoma, left eye, moderate stage: Secondary | ICD-10-CM | POA: Diagnosis not present

## 2024-08-12 ENCOUNTER — Encounter: Admitting: Physician Assistant

## 2024-08-12 ENCOUNTER — Telehealth: Payer: Self-pay | Admitting: Physician Assistant

## 2024-08-12 DIAGNOSIS — M5416 Radiculopathy, lumbar region: Secondary | ICD-10-CM

## 2024-08-12 DIAGNOSIS — M7138 Other bursal cyst, other site: Secondary | ICD-10-CM

## 2024-08-12 MED ORDER — GABAPENTIN 100 MG PO CAPS: 100.0000 mg | ORAL_CAPSULE | Freq: Two times a day (BID) | ORAL | 0 refills | Status: DC

## 2024-08-12 NOTE — Telephone Encounter (Signed)
 Patient advised.

## 2024-08-12 NOTE — Telephone Encounter (Signed)
 I had to call the patient to reschedule her last post-op appointment with Lyle, and weve set her new appointment for 1/20. She asked about what to do regarding her Gabapentin . Shes currently taking 100 mg twice a day and has 36 pills left, which will only last her for 2 weeks. She thought Lyle was planning to wean her off the medication but wasnt sure. If she needs to stay on it, she will need a refill to last her until her appointment on the 20th.

## 2024-08-12 NOTE — Telephone Encounter (Signed)
 If she is still having the numbness, then I recommend she stay on the gabapentin  until she sees [facility].   Given 44 day supply on 11/18. Would be out on around 12/31.   Refill sent to pharmacy on gabapentin  100mg  bid. Please let her know.

## 2024-09-10 ENCOUNTER — Ambulatory Visit

## 2024-09-10 ENCOUNTER — Ambulatory Visit: Admitting: Physician Assistant

## 2024-09-10 ENCOUNTER — Encounter: Payer: Self-pay | Admitting: Physician Assistant

## 2024-09-10 VITALS — BP 110/80 | Wt 116.0 lb

## 2024-09-10 DIAGNOSIS — M545 Low back pain, unspecified: Secondary | ICD-10-CM

## 2024-09-10 DIAGNOSIS — Z09 Encounter for follow-up examination after completed treatment for conditions other than malignant neoplasm: Secondary | ICD-10-CM

## 2024-09-10 DIAGNOSIS — M7138 Other bursal cyst, other site: Secondary | ICD-10-CM | POA: Diagnosis not present

## 2024-09-10 DIAGNOSIS — M5416 Radiculopathy, lumbar region: Secondary | ICD-10-CM | POA: Diagnosis not present

## 2024-09-10 MED ORDER — GABAPENTIN 100 MG PO CAPS: 100.0000 mg | ORAL_CAPSULE | Freq: Two times a day (BID) | ORAL | 3 refills | Status: AC

## 2024-09-10 NOTE — Progress Notes (Unsigned)
" ° °  REFERRING PHYSICIAN:  Gretta Comer POUR, [address] [name],  [place] [address]  DOS: 05/23/24, right sided lumbar hemilaminectomy and L3/4 for resection of synovial cyst  HISTORY OF PRESENT ILLNESS: Christy [name] is approximately 2 weeks status post above surgery. she is doing well.  Her pain has improved quite a bit.  She does have some residual numbness and weakness in her right thigh.  She is mainly using gabapentin  for her nerve pain.   Some esidual numbness in calf.  Mainly with sitting forf a long period of time. Numbness in bottom of foot. Some non radiatig midline back pain, non radiating     PHYSICAL EXAMINATION:  General: Patient is well developed, well nourished, calm, collected, and in no apparent distress.   NEUROLOGICAL:  General: In no acute distress.   Awake, alert, oriented to person, place, and time.  Pupils equal round and reactive to light.  Facial tone is symmetric.     Strength:            Side Iliopsoas Quads Hamstring PF DF EHL  R 4 5 5 5 5 5   L 5 5 5 5 5 5    Incision c/d/i   ROS (Neurologic):  Negative except as noted above  Surgical pathology:  FINAL DIAGNOSIS        1. Synovium, cyst, right side L3-4 joint :       DENSE FIRM CONNECTIVE TISSUE FOCALLY LINED BY REACTIVE SYNOVIUM WITH CHRONIC       INFLAMMATION WITH ASSOCIATED CYST CONTENTS COMPATIBLE WITH A SYNOVIAL CYST       ADDITIONAL FRAGMENTS OF REACTIVE BONE AND HYALINE CARTILAGE    IMAGING: No new imaging.  ASSESSMENT/PLAN:  Christy [name] is doing well approximately 2 weeks after right sided lumbar hemilaminectomy at L3-4 for resection of synovial cyst on 05/23/2024. she will follow up in 1 month for her 6-week postop visit I have advised the patient to lift up to 10 pounds until 6 weeks after surgery, then increase up to 25 pounds until 12 weeks after surgery.  After 12 weeks post-op, the patient advised to increase activity as tolerated.  Advised patient to continue  gabapentin .  We will work on weaning this at around 6 weeks postop if she would like to do that.    Advised to contact the office if any questions or concerns arise.  Lyle Decamp PA-C Department of neurosurgery    "

## 2024-10-08 ENCOUNTER — Ambulatory Visit: Admitting: Dermatology

## 2024-10-25 ENCOUNTER — Ambulatory Visit

## 2024-10-30 ENCOUNTER — Ambulatory Visit

## 2024-11-14 ENCOUNTER — Encounter: Admitting: Primary Care
